# Patient Record
Sex: Female | Born: 1990 | Race: White | Hispanic: No | Marital: Single | Smoking: Never smoker
Health system: Southern US, Community
[De-identification: ages and names within clinical notes are randomized; demographics above are authoritative.]

## PROBLEM LIST (undated history)

## (undated) DIAGNOSIS — M5481 Occipital neuralgia: Secondary | ICD-10-CM

## (undated) DIAGNOSIS — F419 Anxiety disorder, unspecified: Secondary | ICD-10-CM

## (undated) DIAGNOSIS — R51 Headache: Secondary | ICD-10-CM

## (undated) DIAGNOSIS — R519 Headache, unspecified: Secondary | ICD-10-CM

## (undated) DIAGNOSIS — G43909 Migraine, unspecified, not intractable, without status migrainosus: Secondary | ICD-10-CM

## (undated) DIAGNOSIS — J45909 Unspecified asthma, uncomplicated: Secondary | ICD-10-CM

## (undated) DIAGNOSIS — I471 Supraventricular tachycardia, unspecified: Secondary | ICD-10-CM

## (undated) DIAGNOSIS — R000 Tachycardia, unspecified: Secondary | ICD-10-CM

## (undated) DIAGNOSIS — D649 Anemia, unspecified: Secondary | ICD-10-CM

## (undated) HISTORY — PX: OTHER SURGICAL HISTORY: SHX169

## (undated) HISTORY — PX: TONSILLECTOMY AND ADENOIDECTOMY: SHX28

---

## 2016-04-10 DIAGNOSIS — G4726 Circadian rhythm sleep disorder, shift work type: Secondary | ICD-10-CM | POA: Insufficient documentation

## 2017-05-24 DIAGNOSIS — G43709 Chronic migraine without aura, not intractable, without status migrainosus: Secondary | ICD-10-CM | POA: Insufficient documentation

## 2017-12-27 DIAGNOSIS — J4520 Mild intermittent asthma, uncomplicated: Secondary | ICD-10-CM | POA: Insufficient documentation

## 2018-04-09 DIAGNOSIS — R000 Tachycardia, unspecified: Secondary | ICD-10-CM | POA: Insufficient documentation

## 2018-09-17 DIAGNOSIS — F17200 Nicotine dependence, unspecified, uncomplicated: Secondary | ICD-10-CM | POA: Diagnosis not present

## 2018-09-17 DIAGNOSIS — J4520 Mild intermittent asthma, uncomplicated: Secondary | ICD-10-CM | POA: Diagnosis not present

## 2018-09-17 DIAGNOSIS — G43909 Migraine, unspecified, not intractable, without status migrainosus: Secondary | ICD-10-CM | POA: Diagnosis not present

## 2018-09-17 DIAGNOSIS — R002 Palpitations: Secondary | ICD-10-CM | POA: Diagnosis not present

## 2018-09-17 DIAGNOSIS — Z888 Allergy status to other drugs, medicaments and biological substances status: Secondary | ICD-10-CM | POA: Diagnosis not present

## 2018-09-17 DIAGNOSIS — Z91013 Allergy to seafood: Secondary | ICD-10-CM | POA: Diagnosis not present

## 2018-09-17 DIAGNOSIS — F419 Anxiety disorder, unspecified: Secondary | ICD-10-CM | POA: Diagnosis not present

## 2018-09-17 DIAGNOSIS — R000 Tachycardia, unspecified: Secondary | ICD-10-CM | POA: Diagnosis not present

## 2018-09-17 DIAGNOSIS — Z9102 Food additives allergy status: Secondary | ICD-10-CM | POA: Diagnosis not present

## 2018-09-17 DIAGNOSIS — Z91048 Other nonmedicinal substance allergy status: Secondary | ICD-10-CM | POA: Diagnosis not present

## 2018-10-04 DIAGNOSIS — J019 Acute sinusitis, unspecified: Secondary | ICD-10-CM | POA: Diagnosis not present

## 2018-11-20 ENCOUNTER — Other Ambulatory Visit: Payer: Self-pay

## 2018-11-25 ENCOUNTER — Emergency Department (HOSPITAL_BASED_OUTPATIENT_CLINIC_OR_DEPARTMENT_OTHER)
Admission: EM | Admit: 2018-11-25 | Discharge: 2018-11-25 | Disposition: A | Discharge status: Home / Self Care | Payer: BLUE CROSS/BLUE SHIELD | Attending: Emergency Medicine | Admitting: Emergency Medicine

## 2018-11-25 ENCOUNTER — Encounter (HOSPITAL_BASED_OUTPATIENT_CLINIC_OR_DEPARTMENT_OTHER): Payer: Self-pay | Admitting: *Deleted

## 2018-11-25 DIAGNOSIS — M542 Cervicalgia: Secondary | ICD-10-CM | POA: Diagnosis not present

## 2018-11-25 DIAGNOSIS — R739 Hyperglycemia, unspecified: Secondary | ICD-10-CM | POA: Insufficient documentation

## 2018-11-25 DIAGNOSIS — R51 Headache: Secondary | ICD-10-CM | POA: Diagnosis not present

## 2018-11-25 DIAGNOSIS — G2409 Other drug induced dystonia: Secondary | ICD-10-CM | POA: Diagnosis not present

## 2018-11-25 DIAGNOSIS — M791 Myalgia, unspecified site: Secondary | ICD-10-CM | POA: Diagnosis not present

## 2018-11-25 DIAGNOSIS — M5481 Occipital neuralgia: Secondary | ICD-10-CM | POA: Diagnosis not present

## 2018-11-25 DIAGNOSIS — G2402 Drug induced acute dystonia: Secondary | ICD-10-CM | POA: Insufficient documentation

## 2018-11-25 DIAGNOSIS — R000 Tachycardia, unspecified: Secondary | ICD-10-CM | POA: Diagnosis not present

## 2018-11-25 HISTORY — DX: Headache, unspecified: R51.9

## 2018-11-25 HISTORY — DX: Tachycardia, unspecified: R00.0

## 2018-11-25 HISTORY — DX: Headache: R51

## 2018-11-25 HISTORY — DX: Occipital neuralgia: M54.81

## 2018-11-25 LAB — URINALYSIS, ROUTINE W REFLEX MICROSCOPIC
BILIRUBIN URINE: NEGATIVE
Glucose, UA: >=500 mg/dL — AB
Ketones, ur: 15 mg/dL — AB
Leukocytes, UA: NEGATIVE
Nitrite: NEGATIVE
Protein, ur: NEGATIVE mg/dL
Specific Gravity, Urine: 1.02 (ref 1.005–1.030)
pH: 6 (ref 5.0–8.0)

## 2018-11-25 LAB — CBC WITH DIFFERENTIAL/PLATELET
Abs Immature Granulocytes: 0.01 10*3/uL (ref 0.00–0.07)
BASOS ABS: 0 10*3/uL (ref 0.0–0.1)
Basophils Relative: 0 %
Eosinophils Absolute: 0 10*3/uL (ref 0.0–0.5)
Eosinophils Relative: 0 %
HCT: 41.1 % (ref 36.0–46.0)
Hemoglobin: 13.7 g/dL (ref 12.0–15.0)
Immature Granulocytes: 0 %
LYMPHS PCT: 13 %
Lymphs Abs: 0.5 10*3/uL — ABNORMAL LOW (ref 0.7–4.0)
MCH: 29.3 pg (ref 26.0–34.0)
MCHC: 33.3 g/dL (ref 30.0–36.0)
MCV: 87.8 fL (ref 80.0–100.0)
Monocytes Absolute: 0.1 10*3/uL (ref 0.1–1.0)
Monocytes Relative: 2 %
Neutro Abs: 3.3 10*3/uL (ref 1.7–7.7)
Neutrophils Relative %: 85 %
Platelets: 333 10*3/uL (ref 150–400)
RBC: 4.68 MIL/uL (ref 3.87–5.11)
RDW: 11.9 % (ref 11.5–15.5)
WBC: 3.9 10*3/uL — ABNORMAL LOW (ref 4.0–10.5)
nRBC: 0 % (ref 0.0–0.2)

## 2018-11-25 LAB — PREGNANCY, URINE: Preg Test, Ur: NEGATIVE

## 2018-11-25 LAB — BASIC METABOLIC PANEL
Anion gap: 9 (ref 5–15)
BUN: 11 mg/dL (ref 6–20)
CO2: 17 mmol/L — ABNORMAL LOW (ref 22–32)
Calcium: 9.3 mg/dL (ref 8.9–10.3)
Chloride: 106 mmol/L (ref 98–111)
Creatinine, Ser: 0.84 mg/dL (ref 0.44–1.00)
GFR calc Af Amer: >60 mL/min (ref >60)
GFR calc non Af Amer: >60 mL/min (ref >60)
Glucose, Bld: 187 mg/dL — ABNORMAL HIGH (ref 70–99)
Potassium: 4 mmol/L (ref 3.5–5.1)
Sodium: 132 mmol/L — ABNORMAL LOW (ref 135–145)

## 2018-11-25 LAB — URINALYSIS, MICROSCOPIC (REFLEX)

## 2018-11-25 LAB — CBG MONITORING, ED: Glucose-Capillary: 177 mg/dL — ABNORMAL HIGH (ref 70–99)

## 2018-11-25 MED ORDER — DIPHENHYDRAMINE HCL 50 MG/ML IJ SOLN
25.0000 mg | Freq: Once | INTRAMUSCULAR | Status: AC
  Administered 2018-11-25: 25 mg via INTRAVENOUS
  Filled 2018-11-25: qty 1

## 2018-11-25 NOTE — ED Provider Notes (Signed)
[facility] EMERGENCY DEPARTMENT Provider Note   CSN: [id number] Arrival date & time: 11/25/18  1912     History   Chief Complaint Chief Complaint  Patient presents with  . Headache    HPI Michelle Vincent is a 28 y.o. female.  She is complaining of acute onset of right-sided headache and spasm in the right side of her neck and head and jaw.  She is causing her head to turn.  Her coworkers noticed this is unusual and took her home.  This all started around 3:30 PM.  She said she is feels a little bit of tingling in her right arm.  No blurry vision or double vision.  She has a history of headaches and had some trigger point injections for occipital neuralgia today which she has had before and that would not cause anything like this.  No recent illness.  The history is provided by the patient.  Headache  Pain location:  R temporal Quality:  Dull Radiates to:  R neck Onset quality:  Sudden Duration:  4 hours Timing:  Constant Progression:  Unchanged Chronicity:  Recurrent Similar to prior headaches: yes   Associated symptoms: facial pain, neck pain and tingling   Associated symptoms: no abdominal pain, no back pain, no fever, no numbness, no sore throat, no visual change, no vomiting and no weakness     Past Medical History:  Diagnosis Date  . HA (headache)   . Occipital neuralgia   . Sinus tachycardia     There are no active problems to display for this patient.   Past Surgical History:  Procedure Laterality Date  . shoulder Right      OB History   No obstetric history on file.      Home Medications    Prior to Admission medications   Not on File    Family History History reviewed. No pertinent family history.  Social History Social History   Tobacco Use  . Smoking status: Never Smoker  . Smokeless tobacco: Never Used  Substance Use Topics  . Alcohol use: Never    Frequency: Never  . Drug use: Never     Allergies   Shellfish  allergy and Reglan [metoclopramide]   Review of Systems Review of Systems  Constitutional: Negative for fever.  HENT: Negative for sore throat.   Eyes: Negative for visual disturbance.  Respiratory: Negative for shortness of breath.   Cardiovascular: Negative for chest pain.  Gastrointestinal: Negative for abdominal pain and vomiting.  Genitourinary: Negative for dysuria.  Musculoskeletal: Positive for neck pain. Negative for back pain.  Skin: Negative for rash.  Neurological: Positive for headaches. Negative for weakness and numbness.     Physical Exam Updated Vital Signs BP 126/75 (BP Location: Left Arm)   Pulse (!) 110   Temp (!) 97.5 F (36.4 C) (Oral)   Resp 16   Ht 5\' 7"  (1.702 m)   Wt 64 kg   SpO2 100%   BMI 22.08 kg/m   Physical Exam Vitals signs and nursing note reviewed.  Constitutional:      General: She is not in acute distress.    Appearance: She is well-developed.  HENT:     Head: Normocephalic and atraumatic.  Eyes:     Conjunctiva/sclera: Conjunctivae normal.  Neck:     Musculoskeletal: Neck supple.  Cardiovascular:     Rate and Rhythm: Normal rate and regular rhythm.     Heart sounds: No murmur.  Pulmonary:  Effort: Pulmonary effort is normal. No respiratory distress.     Breath sounds: Normal breath sounds.  Abdominal:     Palpations: Abdomen is soft.     Tenderness: There is no abdominal tenderness.  Skin:    General: Skin is warm and dry.  Neurological:     Mental Status: She is alert.     GCS: GCS eye subscore is 4. GCS verbal subscore is 5. GCS motor subscore is 6.     Cranial Nerves: No cranial nerve deficit or dysarthria.     Sensory: Sensory deficit (subjective decrease right arm) present.     Motor: No weakness.      ED Treatments / Results  Labs (all labs ordered are listed, but only abnormal results are displayed) Labs Reviewed  BASIC METABOLIC PANEL - Abnormal; Notable for the following components:      Result Value     Sodium 132 (*)    CO2 17 (*)    Glucose, Bld 187 (*)    All other components within normal limits  CBC WITH DIFFERENTIAL/PLATELET - Abnormal; Notable for the following components:   WBC 3.9 (*)    Lymphs Abs 0.5 (*)    All other components within normal limits  URINALYSIS, ROUTINE W REFLEX MICROSCOPIC - Abnormal; Notable for the following components:   Glucose, UA >=500 (*)    Hgb urine dipstick TRACE (*)    Ketones, ur 15 (*)    All other components within normal limits  URINALYSIS, MICROSCOPIC (REFLEX) - Abnormal; Notable for the following components:   Bacteria, UA RARE (*)    All other components within normal limits  CBG MONITORING, ED - Abnormal; Notable for the following components:   Glucose-Capillary 177 (*)    All other components within normal limits  PREGNANCY, URINE    EKG EKG Interpretation  Date/Time:  Monday November 25 2018 19:23:23 EST Ventricular Rate:  113 PR Interval:  128 QRS Duration: 66 QT Interval:  324 QTC Calculation: 444 R Axis:   97 Text Interpretation:  Sinus tachycardia Rightward axis Nonspecific T wave abnormality Abnormal ECG no prior to compare with Confirmed by Meridee Score [phone]) on 11/25/2018 7:39:11 PM   Radiology No results found.  Procedures Procedures (including critical care time)  Medications Ordered in ED Medications  diphenhydrAMINE (BENADRYL) injection 25 mg (has no administration in time range)     Initial Impression / Assessment and Plan / ED Course  I have reviewed the triage vital signs and the nursing notes.  Pertinent labs & imaging results that were available during my care of the patient were reviewed by me and considered in my medical decision making (see chart for details).  Clinical Course as of Nov 25 2344  Mon Nov 25, 2018  3888 Reevaluated patient after 20 mg of IV Benadryl.  Her neck spasm is much improved her face symmetry looks better and she is able to speak more easily.  She says her symptoms  are 90% improved already.   [MB]  2117 Patient feels much better now and the rest of her work-up is been fairly unremarkable other than a slight bump in her sugars.  She says her brother is a type I diabetic so she may be declaring herself without or may just be fact that she had this steroid challenge with the injections.  She understands to follow-up with her neurologist regarding this.   [MB]    Clinical Course User Index [MB] Terrilee Files, MD  Final Clinical Impressions(s) / ED Diagnoses   Final diagnoses:  Dystonic drug reaction  Hyperglycemia    ED Discharge Orders    None       Terrilee FilesButler, Jaleal Schliep C, MD 11/25/18 [phone]

## 2018-11-25 NOTE — ED Triage Notes (Signed)
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  Pt had injections in her head this morning for migraine and occipital neuralgia. Noted to have a slight facial droop, weakness noted on the left side.  pts boyfriends denies confusion. Pt reports right sided HA.

## 2018-11-25 NOTE — ED Notes (Signed)
Post benadryl- pt does not appear to be as stiff in neck/head. Pt reports feeling better. Still decreased sensation to right side. Pt ambulatory to BR with steady gate.

## 2018-11-25 NOTE — ED Notes (Signed)
ED Provider at bedside. 

## 2018-11-25 NOTE — Discharge Instructions (Addendum)
You were seen in the emergency department for intense face and neck spasms.  Your symptoms improved with some Benadryl.  This is likely a dystonic reaction and is usually caused by medications.  You should continue the Benadryl 1 to 2 tablets every 6 hours as needed.  Please contact your neurologist and let them know about this issue that you had.  Also your blood sugar was mildly elevated and this may be a sign of diabetes or may just be related to the steroid injections.  This will need to be followed by your regular doctor for further blood testing.

## 2018-11-29 ENCOUNTER — Encounter: Payer: Self-pay | Admitting: Family Medicine

## 2018-11-29 ENCOUNTER — Ambulatory Visit (INDEPENDENT_AMBULATORY_CARE_PROVIDER_SITE_OTHER): Payer: BLUE CROSS/BLUE SHIELD | Admitting: Family Medicine

## 2018-11-29 VITALS — Ht 67.0 in | Wt 144.0 lb

## 2018-11-29 DIAGNOSIS — Z3202 Encounter for pregnancy test, result negative: Secondary | ICD-10-CM | POA: Diagnosis not present

## 2018-11-29 DIAGNOSIS — Z30017 Encounter for initial prescription of implantable subdermal contraceptive: Secondary | ICD-10-CM | POA: Diagnosis not present

## 2018-11-29 DIAGNOSIS — Z124 Encounter for screening for malignant neoplasm of cervix: Secondary | ICD-10-CM | POA: Diagnosis not present

## 2018-11-29 DIAGNOSIS — Z01419 Encounter for gynecological examination (general) (routine) without abnormal findings: Secondary | ICD-10-CM

## 2018-11-29 DIAGNOSIS — Z113 Encounter for screening for infections with a predominantly sexual mode of transmission: Secondary | ICD-10-CM

## 2018-11-29 LAB — POCT URINE PREGNANCY: Preg Test, Ur: NEGATIVE

## 2018-11-29 MED ORDER — MEDROXYPROGESTERONE ACETATE 10 MG PO TABS: 10.0000 mg | ORAL_TABLET | Freq: Every day | ORAL | 6 refills | Status: DC | PRN

## 2018-11-29 MED ORDER — ETONOGESTREL 68 MG ~~LOC~~ IMPL
68.0000 mg | DRUG_IMPLANT | Freq: Once | SUBCUTANEOUS | Status: AC
  Administered 2018-11-29: 68 mg via SUBCUTANEOUS

## 2018-11-29 NOTE — Patient Instructions (Addendum)
Nexplanon Instructions After Insertion   Keep bandage clean and dry for 24 hours   May use ice/Tylenol/Ibuprofen for soreness or pain   If you develop fever, drainage or increased warmth from incision site-contact office immediately    Preventive Care 18-39 Years, Female Preventive care refers to lifestyle choices and visits with your health care provider that can promote health and wellness. What does preventive care include?   A yearly physical exam. This is also called an annual well check.  Dental exams once or twice a year.  Routine eye exams. Ask your health care provider how often you should have your eyes checked.  Personal lifestyle choices, including: ? Daily care of your teeth and gums. ? Regular physical activity. ? Eating a healthy diet. ? Avoiding tobacco and drug use. ? Limiting alcohol use. ? Practicing safe sex. ? Taking vitamin and mineral supplements as recommended by your health care provider. What happens during an annual well check? The services and screenings done by your health care provider during your annual well check will depend on your age, overall health, lifestyle risk factors, and family history of disease. Counseling Your health care provider may ask you questions about your:  Alcohol use.  Tobacco use.  Drug use.  Emotional well-being.  Home and relationship well-being.  Sexual activity.  Eating habits.  Work and work Statistician.  Method of birth control.  Menstrual cycle.  Pregnancy history. Screening You may have the following tests or measurements:  Height, weight, and BMI.  Diabetes screening. This is done by checking your blood sugar (glucose) after you have not eaten for a while (fasting).  Blood pressure.  Lipid and cholesterol levels. These may be checked every 5 years starting at age 4.  Skin check.  Hepatitis C blood test.  Hepatitis B blood test.  Sexually transmitted disease (STD)  testing.  BRCA-related cancer screening. This may be done if you have a family history of breast, ovarian, tubal, or peritoneal cancers.  Pelvic exam and Pap test. This may be done every 3 years starting at age 65. Starting at age 76, this may be done every 5 years if you have a Pap test in combination with an HPV test. Discuss your test results, treatment options, and if necessary, the need for more tests with your health care provider. Vaccines Your health care provider may recommend certain vaccines, such as:  Influenza vaccine. This is recommended every year.  Tetanus, diphtheria, and acellular pertussis (Tdap, Td) vaccine. You may need a Td booster every 10 years.  Varicella vaccine. You may need this if you have not been vaccinated.  HPV vaccine. If you are 48 or younger, you may need three doses over 6 months.  Measles, mumps, and rubella (MMR) vaccine. You may need at least one dose of MMR. You may also need a second dose.  Pneumococcal 13-valent conjugate (PCV13) vaccine. You may need this if you have certain conditions and were not previously vaccinated.  Pneumococcal polysaccharide (PPSV23) vaccine. You may need one or two doses if you smoke cigarettes or if you have certain conditions.  Meningococcal vaccine. One dose is recommended if you are age 13-21 years and a first-year college student living in a residence hall, or if you have one of several medical conditions. You may also need additional booster doses.  Hepatitis A vaccine. You may need this if you have certain conditions or if you travel or work in places where you may be exposed to hepatitis A.  Hepatitis B vaccine. You may need this if you have certain conditions or if you travel or work in places where you may be exposed to hepatitis B.  Haemophilus influenzae type b (Hib) vaccine. You may need this if you have certain risk factors. Talk to your health care provider about which screenings and vaccines you need  and how often you need them. This information is not intended to replace advice given to you by your health care provider. Make sure you discuss any questions you have with your health care provider. Document Released: 12/19/2001 Document Revised: 06/05/2017 Document Reviewed: 08/24/2015 Elsevier Interactive Patient Education  2019 [employer].

## 2018-11-29 NOTE — Progress Notes (Signed)
GYNECOLOGY ANNUAL PREVENTATIVE CARE ENCOUNTER NOTE  Subjective:   Michelle Vincent is a 28 y.o. G0P0000 female here for a routine annual gynecologic exam.  Current complaints: heavy painful periods on the nuvaring. Has had an ovarian cyst, but only once.   Denies abnormal vaginal bleeding, discharge, pelvic pain, problems with intercourse or other gynecologic concerns.    Gynecologic History Patient's last menstrual period was 11/08/2018 (within days). Patient is sexually active  Contraception: NuvaRing vaginal inserts Last Pap: "awhile ago". Results were: normal Last mammogram: n/a.  Obstetric History OB History  Gravida Para Term Preterm AB Living  0 0 0 0 0 0  SAB TAB Ectopic Multiple Live Births  0 0 0 0 0    Past Medical History:  Diagnosis Date  . HA (headache)   . Occipital neuralgia   . Sinus tachycardia     Past Surgical History:  Procedure Laterality Date  . shoulder Right     No current outpatient medications on file prior to visit.   No current facility-administered medications on file prior to visit.     Allergies  Allergen Reactions  . Shellfish Allergy Anaphylaxis  . Reglan [Metoclopramide]     Muscle spasms    Social History   Socioeconomic History  . Marital status: Married    Spouse name: Not on file  . Number of children: Not on file  . Years of education: Not on file  . Highest education level: Not on file  Occupational History  . Not on file  Social Needs  . Financial resource strain: Not on file  . Food insecurity:    Worry: Not on file    Inability: Not on file  . Transportation needs:    Medical: Not on file    Non-medical: Not on file  Tobacco Use  . Smoking status: Never Smoker  . Smokeless tobacco: Never Used  Substance and Sexual Activity  . Alcohol use: Yes    Alcohol/week: 1.0 standard drinks    Types: 1 Glasses of wine per week    Frequency: Never  . Drug use: Never  . Sexual activity: Yes  Lifestyle  .  Physical activity:    Days per week: Not on file    Minutes per session: Not on file  . Stress: Not on file  Relationships  . Social connections:    Talks on phone: Not on file    Gets together: Not on file    Attends religious service: Not on file    Active member of club or organization: Not on file    Attends meetings of clubs or organizations: Not on file    Relationship status: Not on file  . Intimate partner violence:    Fear of current or ex partner: Not on file    Emotionally abused: Not on file    Physically abused: Not on file    Forced sexual activity: Not on file  Other Topics Concern  . Not on file  Social History Narrative  . Not on file    Family History  Problem Relation Age of Onset  . Hypertension Mother   . Stroke Mother   . Diabetes Brother   . Breast cancer Maternal Aunt   . Cancer Maternal Aunt     The following portions of the patient's history were reviewed and updated as appropriate: allergies, current medications, past family history, past medical history, past social history, past surgical history and problem list.  Review of Systems Pertinent  items are noted in HPI.   Objective:  Ht 5\' 7"  (1.702 m)   Wt 144 lb (65.3 kg)   LMP 11/08/2018 (Within Days)   BMI 22.55 kg/m  Wt Readings from Last 3 Encounters:  11/29/18 144 lb (65.3 kg)  11/25/18 141 lb (64 kg)     CONSTITUTIONAL: Well-developed, well-nourished female in no acute distress.  HENT:  Normocephalic, atraumatic, External right and left ear normal. Oropharynx is clear and moist EYES: Conjunctivae and EOM are normal. Pupils are equal, round, and reactive to light. No scleral icterus.  NECK: Normal range of motion, supple, no masses.  Normal thyroid.   CARDIOVASCULAR: Normal heart rate noted, regular rhythm RESPIRATORY: Clear to auscultation bilaterally. Effort and breath sounds normal, no problems with respiration noted. BREASTS: Symmetric in size. No masses, skin changes, nipple  drainage, or lymphadenopathy. ABDOMEN: Soft, normal bowel sounds, no distention noted.  No tenderness, rebound or guarding.  PELVIC: Normal appearing external genitalia; normal appearing vaginal mucosa and cervix.  No abnormal discharge noted.  Normal uterine size, no other palpable masses, no uterine or adnexal tenderness. MUSCULOSKELETAL: Normal range of motion. No tenderness.  No cyanosis, clubbing, or edema.  2+ distal pulses. SKIN: Skin is warm and dry. No rash noted. Not diaphoretic. No erythema. No pallor. NEUROLOGIC: Alert and oriented to person, place, and time. Normal reflexes, muscle tone coordination. No cranial nerve deficit noted. PSYCHIATRIC: Normal mood and affect. Normal behavior. Normal judgment and thought content.  Assessment:  Annual gynecologic examination with pap smear   Plan:  1. Well Woman Exam Will follow up results of pap smear and manage accordingly. STD testing discussed. Patient requested vaginal testing  2. Encounter for nexplanon insertion See separate procedure note.  Routine preventative health maintenance measures emphasized. Please refer to After Visit Summary for other counseling recommendations.    Candelaria Celeste, DO [facility]

## 2018-11-29 NOTE — Progress Notes (Signed)
Patient currently using Nuvaring. Patient having heavy discharge - patient states that she has lots of clots and tissue when her period starts.  Armandina Stammer RN

## 2018-11-29 NOTE — Progress Notes (Signed)
Nexplanon Insertion:  Patient given informed consent, signed copy in the chart, time out was performed. Appropriate time out taken.  Patient's left arm was prepped and draped in the usual sterile fashion.. The ruler used to measure and mark insertion area.  Pt was prepped with alcohol swab and then injected with 5 cc of 2% lidocaine with epinephrine.  Pt was prepped with betadine, Implanon removed form packaging,  Device confirmed in needle, then inserted full length of needle and withdrawn per handbook instructions.  Device palpated by physician and patient.  Pt insertion site covered with pressure dressing.   Minimal blood loss.  Pt tolerated the procedure well.  

## 2018-12-02 LAB — CYTOLOGY - PAP
Chlamydia: NEGATIVE
Diagnosis: NEGATIVE
NEISSERIA GONORRHEA: NEGATIVE
Trichomonas: NEGATIVE

## 2018-12-09 ENCOUNTER — Encounter: Payer: Self-pay | Admitting: Family

## 2018-12-10 ENCOUNTER — Telehealth: Payer: Self-pay

## 2018-12-10 NOTE — Telephone Encounter (Signed)
Pt called the office requesting Pap smear results. Informed pt that Pap smear is normal and STD screening is negative. Understanding was voiced. Lujain Kraszewski l Delance Weide, CMA

## 2019-01-22 ENCOUNTER — Encounter (HOSPITAL_COMMUNITY): Payer: Self-pay | Admitting: Emergency Medicine

## 2019-01-22 ENCOUNTER — Emergency Department (HOSPITAL_COMMUNITY)
Admission: EM | Admit: 2019-01-22 | Discharge: 2019-01-23 | Disposition: A | Discharge status: Home / Self Care | Payer: 59 | Attending: Emergency Medicine | Admitting: Emergency Medicine

## 2019-01-22 ENCOUNTER — Other Ambulatory Visit: Payer: Self-pay

## 2019-01-22 DIAGNOSIS — R110 Nausea: Secondary | ICD-10-CM | POA: Insufficient documentation

## 2019-01-22 DIAGNOSIS — R0602 Shortness of breath: Secondary | ICD-10-CM | POA: Diagnosis not present

## 2019-01-22 DIAGNOSIS — R000 Tachycardia, unspecified: Secondary | ICD-10-CM | POA: Diagnosis not present

## 2019-01-22 DIAGNOSIS — R072 Precordial pain: Secondary | ICD-10-CM

## 2019-01-22 DIAGNOSIS — R0789 Other chest pain: Secondary | ICD-10-CM | POA: Diagnosis present

## 2019-01-22 HISTORY — DX: Supraventricular tachycardia: I47.1

## 2019-01-22 HISTORY — DX: Supraventricular tachycardia, unspecified: I47.10

## 2019-01-22 MED ORDER — SODIUM CHLORIDE 0.9 % IV BOLUS
1000.0000 mL | Freq: Once | INTRAVENOUS | Status: AC
  Administered 2019-01-22: 1000 mL via INTRAVENOUS

## 2019-01-22 MED ORDER — ACETAMINOPHEN 325 MG PO TABS
650.0000 mg | ORAL_TABLET | Freq: Once | ORAL | Status: AC
  Administered 2019-01-22: 650 mg via ORAL
  Filled 2019-01-22: qty 2

## 2019-01-22 MED ORDER — ONDANSETRON HCL 4 MG/2ML IJ SOLN
4.0000 mg | Freq: Once | INTRAMUSCULAR | Status: AC
  Administered 2019-01-22: 4 mg via INTRAVENOUS
  Filled 2019-01-22: qty 2

## 2019-01-22 MED ORDER — KETOROLAC TROMETHAMINE 15 MG/ML IJ SOLN
15.0000 mg | Freq: Once | INTRAMUSCULAR | Status: AC
  Administered 2019-01-22: 15 mg via INTRAVENOUS
  Filled 2019-01-22: qty 1

## 2019-01-22 MED ORDER — METOPROLOL TARTRATE 5 MG/5ML IV SOLN
2.5000 mg | Freq: Once | INTRAVENOUS | Status: AC
  Administered 2019-01-22: 2.5 mg via INTRAVENOUS
  Filled 2019-01-22: qty 5

## 2019-01-22 NOTE — ED Triage Notes (Signed)
Pt c/o chest pain, shortness of breath and nausea. Symptoms started approx. 1 hour ago. Hx SVT.

## 2019-01-22 NOTE — ED Provider Notes (Signed)
[facility] EMERGENCY DEPARTMENT Provider Note   CSN: [id number] Arrival date & time: 01/22/19  2153    History   Chief Complaint Chief Complaint  Patient presents with  . Chest Pain    HPI Michelle Vincent is a 28 y.o. female.     Patient with history of tachycardia on beta-blocker presents the emergency department with complaint of chest pain, shortness of breath, and nausea.  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  She was not doing anything strenuous except for helping to distribute equipment.  Her symptoms began acutely.  They are consistent with previous episodes.  Patient complains of some mild substernal chest pain as well as shortness of breath.  No lightheadedness or syncope.  She has some mild nausea without vomiting.  States that when her symptoms occur, typically she will be symptomatic for several hours before gradually improving.  No treatments prior to arrival.  Last dose of metoprolol was 3 AM today.  She has seen a cardiologist at [facility] and has had a previous attempted ablation but it sounds like they were unable to find the abnormal pathway.  Patient denies risk factors for pulmonary embolism including: unilateral leg swelling, history of DVT/PE/other blood clots, use of exogenous hormones, recent immobilizations, recent surgery, recent travel (>4hr segment), malignancy, hemoptysis.  No recent nausea, vomiting, or diarrhea or other reasons for dehydration.  No recent viral illness.       Past Medical History:  Diagnosis Date  . HA (headache)   . Occipital neuralgia   . Sinus tachycardia   . SVT (supraventricular tachycardia) (HCC)     There are no active problems to display for this patient.   Past Surgical History:  Procedure Laterality Date  . shoulder Right      OB History    Gravida  0   Para  0   Term  0   Preterm  0   AB  0   Living  0     SAB  0   TAB  0   Ectopic  0   Multiple  0   Live Births  0            Home Medications    Prior to Admission medications   Medication Sig Start Date End Date Taking? Authorizing Provider  medroxyPROGESTERone (PROVERA) 10 MG tablet Take 1 tablet (10 mg total) by mouth daily as needed. For breakthrough bleeding 11/29/18   Levie Heritage, DO    Family History Family History  Problem Relation Age of Onset  . Hypertension Mother   . Stroke Mother   . Diabetes Brother   . Breast cancer Maternal Aunt   . Cancer Maternal Aunt     Social History Social History   Tobacco Use  . Smoking status: Never Smoker  . Smokeless tobacco: Never Used  Substance Use Topics  . Alcohol use: Yes    Alcohol/week: 1.0 standard drinks    Types: 1 Glasses of wine per week    Frequency: Never  . Drug use: Never     Allergies   Shellfish allergy and Reglan [metoclopramide]   Review of Systems Review of Systems  Constitutional: Negative for diaphoresis and fever.  Eyes: Negative for redness.  Respiratory: Positive for shortness of breath. Negative for cough.   Cardiovascular: Positive for chest pain and palpitations. Negative for leg swelling.  Gastrointestinal: Positive for nausea. Negative for abdominal pain and  vomiting.  Genitourinary: Negative for dysuria.  Musculoskeletal: Negative for back pain and neck pain.  Skin: Negative for rash.  Neurological: Negative for syncope and light-headedness.  Psychiatric/Behavioral: The patient is not nervous/anxious.      Physical Exam Updated Vital Signs BP 116/88   Pulse 99   Resp 17   Ht 5\' 7"  (1.702 m)   Wt 63.5 kg   LMP 01/08/2019   SpO2 100%   BMI 21.93 kg/m   Physical Exam Vitals signs and nursing note reviewed.  Constitutional:      Appearance: She is well-developed. She is not diaphoretic.  HENT:     Head: Normocephalic and atraumatic.     Mouth/Throat:     Mouth: Mucous membranes are not dry.  Eyes:     Conjunctiva/sclera: Conjunctivae normal.   Neck:     Musculoskeletal: Normal range of motion and neck supple. No muscular tenderness.     Vascular: Normal carotid pulses. No carotid bruit or JVD.     Trachea: Trachea normal. No tracheal deviation.  Cardiovascular:     Rate and Rhythm: Regular rhythm. Tachycardia present.     Pulses: No decreased pulses.     Heart sounds: Normal heart sounds, S1 normal and S2 normal. No murmur.     Comments: Mild tachycardia 100-110 with sinus arrhythmia. Pulmonary:     Effort: Pulmonary effort is normal. No respiratory distress.     Breath sounds: No wheezing.  Chest:     Chest wall: No tenderness.  Abdominal:     General: Bowel sounds are normal.     Palpations: Abdomen is soft.     Tenderness: There is no abdominal tenderness. There is no guarding or rebound.  Musculoskeletal: Normal range of motion.  Skin:    General: Skin is warm and dry.     Coloration: Skin is not pale.  Neurological:     Mental Status: She is alert.      ED Treatments / Results  Labs (all labs ordered are listed, but only abnormal results are displayed) Labs Reviewed - No data to display  EKG EKG Interpretation  Date/Time:  Wednesday January 22 2019 21:56:33 EDT Ventricular Rate:  115 PR Interval:    QRS Duration: 83 QT Interval:  355 QTC Calculation: 491 R Axis:   87 Text Interpretation:  Sinus tachycardia Probable left atrial enlargement Nonspecific repol abnormality, diffuse leads Borderline prolonged QT interval Confirmed by Kristine Royal [phone]) on 01/22/2019 10:00:19 PM   Radiology No results found.  Procedures Procedures (including critical care time)  Medications Ordered in ED Medications  sodium chloride 0.9 % bolus 1,000 mL (0 mLs Intravenous Stopped 01/22/19 2309)  metoprolol tartrate (LOPRESSOR) injection 2.5 mg (2.5 mg Intravenous Given 01/22/19 2221)  ondansetron (ZOFRAN) injection 4 mg (4 mg Intravenous Given 01/22/19 2221)  ketorolac (TORADOL) 15 MG/ML injection 15 mg (15 mg  Intravenous Given 01/22/19 2221)  acetaminophen (TYLENOL) tablet 650 mg (650 mg Oral Given 01/22/19 2338)     Initial Impression / Assessment and Plan / ED Course  I have reviewed the triage vital signs and the nursing notes.  Pertinent labs & imaging results that were available during my care of the patient were reviewed by me and considered in my medical decision making (see chart for details).        Patient seen and examined. EKG reviewed.  Will give fluids and treat symptoms.  Discussed with patient that lab work-up likely not to be helpful at this point.  She  agrees with foregoing labs at this point.  Will reassess.  Vital signs reviewed and are as follows: BP 116/88   Pulse 99   Resp 17   Ht 5\' 7"  (1.702 m)   Wt 63.5 kg   LMP 01/08/2019   SpO2 100%   BMI 21.93 kg/m   11:14 PM HR improved into 80's. Nausea improved. Toradol helped CP for a little while but she continues to have L CP. No distress. Tylenol ordered. Will reassess.   12:03 AM patient continues to feel better.  She is ready for discharge home at this time.  Cardiology referral given.  Encouraged return with worsening symptoms, shortness of breath, syncope, worsening or different chest pain.  She verbalizes understanding agrees with plan.  Encouraged rest tonight.  Final Clinical Impressions(s) / ED Diagnoses   Final diagnoses:  Sinus tachycardia  Precordial pain   Patient with bouts of tachycardia, questionable SVT in the past.  Typically she will get chest pain and shortness of breath with nausea with these episodes.  Typical episode for her tonight.  Improved while in emergency department.  She was treated symptomatically here.  EKG shows sinus tachycardia without any other arrhythmogenic findings.  She appears well.  She would like a referral to a cardiologist here in [place].  She is a paramedic and is reliable to return with worsening or changing symptoms.  ED Discharge Orders    None       Renne Crigler, Cordelia Poche 01/23/19 0005    Wynetta Fines, MD 01/27/19 [phone]

## 2019-01-23 NOTE — Discharge Instructions (Signed)
Please read and follow all provided instructions.  Your diagnoses today include:  1. Sinus tachycardia   2. Precordial pain     Tests performed today include:  An EKG of your heart  Vital signs. See below for your results today.   Medications prescribed:   None  Take any prescribed medications only as directed.  Follow-up instructions: Please follow-up with your primary care provider and cardiology referral for further evaluation.   Return instructions:   You wake from sleep with chest pain or shortness of breath.  You feel dizzy or faint.  You have chest pain not typical of your usual pain for which you originally saw your caregiver.   You have any other emergent concerns regarding your health.  Additional Information: Chest pain comes from many different causes. Your caregiver has diagnosed you as having chest pain that is not specific for one problem, but does not require admission.  You are at low risk for an acute heart condition or other serious illness.   Your vital signs today were: BP 101/68    Pulse 74    Resp 18    Ht 5\' 7"  (1.702 m)    Wt 63.5 kg    LMP 01/08/2019    SpO2 100%    BMI 21.93 kg/m  If your blood pressure (BP) was elevated above 135/85 this visit, please have this repeated by your doctor within one month. --------------

## 2019-01-23 NOTE — ED Notes (Signed)
Patient verbalizes understanding of discharge instructions. Opportunity for questioning and answers were provided. Armband removed by staff, pt discharged from ED, ambulatory to lobby with significant other.  

## 2019-01-28 ENCOUNTER — Telehealth: Payer: Self-pay | Admitting: Cardiology

## 2019-01-28 NOTE — Telephone Encounter (Signed)
Called patient--unable to leave message - mailbox is full  Reviewing chart this looks as if this would be a new patient to the practice.   reviewing information - looks as if patient has seen a cardiologist at [facility] - Nov 2019

## 2019-01-28 NOTE — Telephone Encounter (Signed)
New Message:     Pt was seen in [facility] ER and was given instructions to follow up with one of our Cardiologist in 1 week. I scheduled pt on 02-18-19, because of our policy to schedule patients after 02-17-19. Please evaluate and see if pt needs to be seen this week as instructed.

## 2019-01-29 NOTE — Telephone Encounter (Signed)
Voice mailbox not set up yet unable to leave message .Zack Seal

## 2019-02-25 ENCOUNTER — Telehealth: Payer: Self-pay

## 2019-02-25 NOTE — Telephone Encounter (Signed)
Called to convert appointment into a virtual visit if agreed.  Patient has not set up her vmail so I was unable to leave a vmail.

## 2019-03-01 NOTE — Progress Notes (Signed)
Virtual Visit via Video Note   This visit type was conducted due to national recommendations for restrictions regarding the COVID-19 Pandemic (e.g. social distancing) in an effort to limit this patient's exposure and mitigate transmission in our community.  Due to her co-morbid illnesses, this patient is at least at moderate risk for complications without adequate follow up.  This format is felt to be most appropriate for this patient at this time.  All issues noted in this document were discussed and addressed.  A limited physical exam was performed with this format.  Please refer to the patient's chart for her consent to telehealth for [facility].   Evaluation Performed:    New patient  Date:  03/03/2019   ID:  Michelle Heckabatha Stallworth, DOB 09/19/1991, MRN [id number]  Patient Location: Home Provider Location: Home  PCP:  Ardell IsaacsHill, Stacy, NP  Cardiologist:  Rollene RotundaJames Jacqualyn Sedgwick, MD  Electrophysiologist:  None   Chief Complaint:  Palpitations   History of Present Illness:    Michelle Vincent is a 28 y.o. female who was referred by Ardell IsaacsHill, Stacy, NP for evaluation of tachycardia.  She was in the ED for this in March.  She had chest pain and dyspnea.  I reviewed these records for this visit.  She is a paramedic.  [facility]  [facility] and I was able to review an EP study.  There were no inducible arrhythmias or pathways identified.  She was treated with AV nodal blocking agents.   In 2015 she had a normal echo.   She has had this history and says that she has been told she had sinus tachycardia.  I do see some mention of SVT but I do not see that this was a formal diagnosis.  She is worn a monitor but it sounds like it is been about a year and a half ago.  She says that her heart rate goes up every day.  Sometimes it is a gradual rise though sometimes it might be abrupt.  Her heart rate seems to go back and forth.  She will feel dizzy and lightheaded with  it.  She will have some chest discomfort.  Seems to go away by itself.  She is tried vagal maneuvers which may or may not help.  I do see that last year her TSH was normal.  More recently when she was in the emergency room her electrolytes were normal.  Her EKG demonstrated sinus tachycardia with a rate of 115 and normal sinus rhythm with no other significant findings.  She has not had orthostatic blood pressure readings although she does describe some mild orthostasis.  She has not had any frank syncope recently.  She cannot bring on chest discomfort.  She does do some activities at work but she does not exercise routinely.  She has had no new shortness of breath, PND or orthopnea.  She had no weight gain or edema.   The patient does have symptoms concerning for COVID-19 infection (fever, chills, cough, or new shortness of breath).    Past Medical History:  Diagnosis Date  . HA (headache)   . Occipital neuralgia   . Sinus tachycardia   . SVT (supraventricular tachycardia) (HCC)    Past Surgical History:  Procedure Laterality Date  . shoulder Right   . TONSILLECTOMY AND ADENOIDECTOMY       Current Meds  Medication Sig  . amitriptyline (ELAVIL) 25 MG tablet 1-2 tablets at bedtime  Allergies:   Shellfish allergy and Reglan [metoclopramide]   Social History   Tobacco Use  . Smoking status: Never Smoker  . Smokeless tobacco: Never Used  Substance Use Topics  . Alcohol use: Yes    Alcohol/week: 1.0 standard drinks    Types: 1 Glasses of wine per week    Frequency: Never  . Drug use: Never     Family Hx: The patient's family history includes Breast cancer in her maternal aunt; Cancer in her maternal aunt; Diabetes in her brother; Hypertension in her mother; Transient ischemic attack in her mother.  ROS:   Please see the history of present illness.    As stated in the HPI and negative for all other systems.   Prior CV studies:   The following studies were reviewed today:   EP study, ED notes, EKG  Labs/Other Tests and Data Reviewed:    EKG:  01/22/19 Sinus tach rate 115, axis and intervals WNL, no acute findings.   Recent Labs: 11/25/2018: BUN 11; Creatinine, Ser 0.84; Hemoglobin 13.7; Platelets 333; Potassium 4.0; Sodium 132   Recent Lipid Panel No results found for: CHOL, TRIG, HDL, CHOLHDL, LDLCALC, LDLDIRECT  Wt Readings from Last 3 Encounters:  03/03/19 147 lb (66.7 kg)  01/22/19 140 lb (63.5 kg)  11/29/18 144 lb (65.3 kg)     Objective:    Vital Signs:  Ht 5\' 7"  (1.702 m)   Wt 147 lb (66.7 kg)   BMI 23.02 kg/m    VITAL SIGNS:  reviewed GEN:  no acute distress EYES:  sclerae anicteric, EOMI - Extraocular Movements Intact NEURO:  alert and oriented x 3, no obvious focal deficit PSYCH:  normal affect  ASSESSMENT & PLAN:    PALPITATIONS:  I am going to ask her to get orthostatic blood pressure checks at her place of work.  She and I talked about this.  She is can increase her salt and hydration.  She is going to wear a Zio patch for 2 weeks.  She can remain off of beta-blocker which did not seem to help anyway.  She is not really taking that currently.  Further evaluation will be based on these results and I will see her back in the office after the monitor to discuss the next therapy.    COVID-19 Education: The signs and symptoms of COVID-19 were discussed with the patient and how to seek care for testing (follow up with PCP or arrange E-visit).  The importance of social distancing was discussed today.  Time:   Today, I have spent 25 minutes with the patient with telehealth technology discussing the above problems.     Medication Adjustments/Labs and Tests Ordered: Current medicines are reviewed at length with the patient today.  Concerns regarding medicines are outlined above.   Tests Ordered: No orders of the defined types were placed in this encounter.   Medication Changes: No orders of the defined types were placed in this  encounter.   Disposition:  Follow up see me after the monitor  Signed, Rollene Rotunda, MD  03/03/2019 12:34 PM    [facility]

## 2019-03-03 ENCOUNTER — Telehealth (INDEPENDENT_AMBULATORY_CARE_PROVIDER_SITE_OTHER): Payer: 59 | Admitting: Cardiology

## 2019-03-03 ENCOUNTER — Encounter: Payer: Self-pay | Admitting: Cardiology

## 2019-03-03 VITALS — Ht 67.0 in | Wt 147.0 lb

## 2019-03-03 DIAGNOSIS — R000 Tachycardia, unspecified: Secondary | ICD-10-CM

## 2019-03-03 NOTE — Patient Instructions (Addendum)
Medication Instructions:  Continue current medications  If you need a refill on your cardiac medications before your next appointment, please call your pharmacy.  Labwork: None Ordered   Testing/Procedures: Your physician has recommended that you wear a Zio Patch monitor for 2 weeks. Event monitors are medical devices that record the heart's electrical activity. Doctors most often [place] these monitors to diagnose arrhythmias. Arrhythmias are problems with the speed or rhythm of the heartbeat. The monitor is a small, portable device. You can wear one while you do your normal daily activities. This is usually used to diagnose what is causing palpitations/syncope (passing out).  . Follow-Up:  Your physician recommends that you schedule a follow-up appointment in: After Zio Patch   At [facility], you and your health needs are our priority.  As part of our continuing mission to provide you with exceptional heart care, we have created designated Provider Care Teams.  These Care Teams include your primary Cardiologist (physician) and Advanced Practice Providers (APPs -  Physician Assistants and Nurse Practitioners) who all work together to provide you with the care you need, when you need it.  Thank you for choosing [facility] at [facility]!!

## 2019-03-03 NOTE — Telephone Encounter (Signed)
° ° °  Patient called to confirm appt, changed to virtual visit. TELEPHONE visit request

## 2019-03-04 ENCOUNTER — Telehealth: Payer: Self-pay | Admitting: Radiology

## 2019-03-04 NOTE — Addendum Note (Signed)
Addended by: Barrie Dunker on: 03/04/2019 11:56 AM   Modules accepted: Orders

## 2019-03-04 NOTE — Telephone Encounter (Signed)
Enrolled patient for a 14 day ZIo long term monitor to be mailed due to Covid-19. Brief instructions were gone over with patient and she knows to expect the monitor to arrive in 3-4 days.

## 2019-03-07 ENCOUNTER — Ambulatory Visit (INDEPENDENT_AMBULATORY_CARE_PROVIDER_SITE_OTHER): Payer: 59

## 2019-03-07 DIAGNOSIS — R000 Tachycardia, unspecified: Secondary | ICD-10-CM

## 2019-03-13 ENCOUNTER — Telehealth: Payer: Self-pay | Admitting: Cardiology

## 2019-03-13 NOTE — Telephone Encounter (Signed)
New Message  Patient states she was told to see Dr. Antoine Poche in office after [facility] Patch.  Just confirming whether or not to schedule in office or virtual appointment.

## 2019-03-20 NOTE — Telephone Encounter (Signed)
Pt voicemail box is not setup.

## 2019-03-21 NOTE — Telephone Encounter (Signed)
appt schedule 06/11

## 2019-04-02 ENCOUNTER — Telehealth: Payer: Self-pay | Admitting: Cardiology

## 2019-04-02 ENCOUNTER — Other Ambulatory Visit: Payer: Self-pay

## 2019-04-02 NOTE — Telephone Encounter (Signed)
Please let her know that she should contact her primary provider for a work excuse.

## 2019-04-02 NOTE — Telephone Encounter (Signed)
Pt aware Dr Antoine Poche has Michelle Vincent schedule today and has not reviewed messages at this time Will let pt know once Dr Antoine Poche responds to messages ./cy

## 2019-04-02 NOTE — Telephone Encounter (Signed)
My Chart message sent

## 2019-04-02 NOTE — Telephone Encounter (Signed)
Called patient in response to MyChart message.   She has intermittent chest pain, SOB, dizziness. She reports sharp chest pain - no triggers. She reports her HR is up and down. Her monitor report is in Epic. She reports these symptoms are similar to previous. 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   Will route to Dr. Antoine Poche & [facility] CMA

## 2019-04-02 NOTE — Telephone Encounter (Signed)
New Message     Pt is calling and is needing a Drs note for work    Please call

## 2019-04-10 ENCOUNTER — Other Ambulatory Visit: Payer: Self-pay

## 2019-04-10 ENCOUNTER — Encounter: Payer: Self-pay | Admitting: Family Medicine

## 2019-04-10 ENCOUNTER — Ambulatory Visit (INDEPENDENT_AMBULATORY_CARE_PROVIDER_SITE_OTHER): Payer: 59 | Admitting: Family Medicine

## 2019-04-10 VITALS — BP 114/64 | HR 89 | Ht 67.0 in | Wt 157.0 lb

## 2019-04-10 DIAGNOSIS — Z3046 Encounter for surveillance of implantable subdermal contraceptive: Secondary | ICD-10-CM

## 2019-04-10 NOTE — Progress Notes (Signed)
Nexplanon Removal:  Patient given informed consent for removal of her Implanon, time out was performed.  Signed copy in the chart.  Appropriate time out taken. Implanon site identified.  Area prepped in usual sterile fashon. One cc of 1% lidocaine was used to anesthetize the area at the distal end of the implant. A small stab incision was made right beside the implant on the distal portion.  The implanon rod was grasped using hemostats and removed without difficulty.  There was less than 3 cc blood loss. There were no complications.  A small amount of antibiotic ointment and steri-strips were applied over the small incision.  A pressure bandage was applied to reduce any bruising.  The patient tolerated the procedure well and was given post procedure instructions. 

## 2019-04-11 ENCOUNTER — Telehealth: Payer: Self-pay | Admitting: Cardiology

## 2019-04-11 NOTE — Telephone Encounter (Signed)
Unable to reach pt voice mailbox not set up Will try later .Zack Seal

## 2019-04-14 NOTE — Telephone Encounter (Signed)
Called x3 for pre reg °

## 2019-04-15 ENCOUNTER — Other Ambulatory Visit: Payer: Self-pay

## 2019-04-15 ENCOUNTER — Telehealth (INDEPENDENT_AMBULATORY_CARE_PROVIDER_SITE_OTHER): Payer: 59 | Admitting: Cardiology

## 2019-04-15 ENCOUNTER — Encounter: Payer: Self-pay | Admitting: Cardiology

## 2019-04-15 ENCOUNTER — Other Ambulatory Visit: Payer: Self-pay | Admitting: Cardiology

## 2019-04-15 VITALS — Ht 67.0 in | Wt 157.0 lb

## 2019-04-15 DIAGNOSIS — R000 Tachycardia, unspecified: Secondary | ICD-10-CM

## 2019-04-15 DIAGNOSIS — R002 Palpitations: Secondary | ICD-10-CM | POA: Diagnosis not present

## 2019-04-15 MED ORDER — PROPRANOLOL HCL 10 MG PO TABS: 10.0000 mg | ORAL_TABLET | ORAL | 11 refills | Status: DC | PRN

## 2019-04-15 NOTE — Patient Instructions (Signed)
Medication Instructions:  START- Propranolol 10 mg as needed  If you need a refill on your cardiac medications before your next appointment, please call your pharmacy.  Labwork: None Ordered   Testing/Procedures: None Ordered  Follow-Up: You will need a follow up appointment in 3 months.  Please call our office 2 months in advance to schedule this appointment.  You may see Minus Breeding, MD or one of the following Advanced Practice Providers on your designated Care Team:   Rosaria Ferries, PA-C . Jory Sims, DNP, ANP     At [facility], you and your health needs are our priority.  As part of our continuing mission to provide you with exceptional heart care, we have created designated Provider Care Teams.  These Care Teams include your primary Cardiologist (physician) and Advanced Practice Providers (APPs -  Physician Assistants and Nurse Practitioners) who all work together to provide you with the care you need, when you need it.  Thank you for choosing [facility] at [facility]!!

## 2019-04-15 NOTE — Progress Notes (Signed)
Virtual Visit via Video Note   This visit type was conducted due to national recommendations for restrictions regarding the COVID-19 Pandemic (e.g. social distancing) in an effort to limit this patient's exposure and mitigate transmission in our community.  Due to her co-morbid illnesses, this patient is at least at moderate risk for complications without adequate follow up.  This format is felt to be most appropriate for this patient at this time.  All issues noted in this document were discussed and addressed.  A limited physical exam was performed with this format.  Please refer to the patient's chart for her consent to telehealth for [facility].   Date:  04/15/2019   ID:  Michelle Vincent, DOB [birth year], MRN [id number]  Patient Location: Home Provider Location: Home  PCP:  Alfonso Ellis, NP  Cardiologist:  Minus Breeding, MD  Electrophysiologist:  None   Evaluation Performed:  Follow-Up Visit  Chief Complaint:  Palpitations  History of Present Illness:    Michelle Vincent is a 28 y.o. female with palpitations as described in the previous note.   She wore an event monitor and I reviewed this with her.  She wore her monitor and she did describe lots of symptoms of palpitations and weakness and chest discomfort.  Her average heart rate was about 98.  Her lowest heart rate was 60 which was about 1:30 in the afternoon.  Her most rapid heart rate was 185 1 evening.  She thought she was at a friend's house playing cards.  She does not describe any presyncope or syncope since then.  She says the episodes as described previously are still happening.  She was not able to get orthostatic readings as I had requested.  She does work as an Neurosurgeon.  The patient does not have symptoms concerning for COVID-19 infection (fever, chills, cough, or new shortness of breath).    Past Medical History:  Diagnosis Date  . HA (headache)   . Occipital neuralgia   . Sinus tachycardia   . SVT  (supraventricular tachycardia) (HCC)    Past Surgical History:  Procedure Laterality Date  . shoulder Right   . TONSILLECTOMY AND ADENOIDECTOMY       Current Meds  Medication Sig  . Erenumab-aooe (AIMOVIG) 140 MG/ML SOAJ Inject 140 mg into the skin every 30 (thirty) days.  . ferrous sulfate 325 (65 FE) MG tablet Take 325 mg by mouth 3 (three) times daily.  . methocarbamol (ROBAXIN) 750 MG tablet Take 750 mg by mouth 3 (three) times daily as needed (for headache).   . ondansetron (ZOFRAN) 8 MG tablet Take 8 mg by mouth every 8 (eight) hours as needed for nausea or vomiting.      Allergies:   Nitrous oxide; Shellfish allergy; and Reglan [metoclopramide]   Social History   Tobacco Use  . Smoking status: Never Smoker  . Smokeless tobacco: Never Used  Substance Use Topics  . Alcohol use: Yes    Alcohol/week: 1.0 standard drinks    Types: 1 Glasses of wine per week    Frequency: Never  . Drug use: Never     Family Hx: The patient's family history includes Breast cancer in her maternal aunt; Cancer in her maternal aunt; Diabetes in her brother; Hypertension in her mother; Transient ischemic attack in her mother.  ROS:   Please see the history of present illness.    Review of Systems  All other systems reviewed and are negative.   Prior CV studies:   The  following studies were reviewed today: Event monitor  Labs/Other Tests and Data Reviewed:    EKG:  No ECG reviewed.  Recent Labs: 11/25/2018: BUN 11; Creatinine, Ser 0.84; Hemoglobin 13.7; Platelets 333; Potassium 4.0; Sodium 132   Recent Lipid Panel No results found for: CHOL, TRIG, HDL, CHOLHDL, LDLCALC, LDLDIRECT  Wt Readings from Last 3 Encounters:  04/15/19 157 lb (71.2 kg)  04/10/19 157 lb 0.6 oz (71.2 kg)  03/03/19 147 lb (66.7 kg)     Objective:    Vital Signs:  Ht 5\' 7"  (1.702 m)   Wt 157 lb (71.2 kg)   BMI 24.59 kg/m    VITAL SIGNS:  reviewed GEN:  no acute distress NEURO:  alert and oriented x 3,  no obvious focal deficit PSYCH:  normal affect  ASSESSMENT & PLAN:    PALPITATIONS:  We reviewed this at length.  At this point I don't see a primary dysrhythmia other than sinus tach.  I am going to empirically treat this with propranolol.  She is can still try to get the orthostatic blood pressure readings.  Further management will be based on her response to this and I plan to see her back in the office in about 3 months to reevaluate symptom   Time:   Today, I have spent 16 minutes with the patient with telehealth technology discussing the above problems.     Medication Adjustments/Labs and Tests Ordered: Current medicines are reviewed at length with the patient today.  Concerns regarding medicines are outlined above.   Tests Ordered: No orders of the defined types were placed in this encounter.   Medication Changes: No orders of the defined types were placed in this encounter.   Disposition:  Follow up three months in the clinic  Signed, Rollene RotundaJames Genavie Boettger, MD  04/15/2019 3:30 PM    [facility]

## 2019-04-15 NOTE — Telephone Encounter (Signed)
Called Pt to change appt from June 11, to have an appt today at 3:20

## 2019-04-17 ENCOUNTER — Telehealth: Payer: 59 | Admitting: Cardiology

## 2019-04-30 NOTE — Telephone Encounter (Signed)
Pt had office visit since this message was received .Adonis Housekeeper

## 2019-05-18 ENCOUNTER — Emergency Department (HOSPITAL_BASED_OUTPATIENT_CLINIC_OR_DEPARTMENT_OTHER): Payer: 59

## 2019-05-18 ENCOUNTER — Other Ambulatory Visit: Payer: Self-pay

## 2019-05-18 ENCOUNTER — Encounter (HOSPITAL_BASED_OUTPATIENT_CLINIC_OR_DEPARTMENT_OTHER): Payer: Self-pay

## 2019-05-18 ENCOUNTER — Emergency Department (HOSPITAL_BASED_OUTPATIENT_CLINIC_OR_DEPARTMENT_OTHER)
Admission: EM | Admit: 2019-05-18 | Discharge: 2019-05-18 | Disposition: A | Discharge status: Home / Self Care | Payer: 59 | Attending: Emergency Medicine | Admitting: Emergency Medicine

## 2019-05-18 DIAGNOSIS — R1032 Left lower quadrant pain: Secondary | ICD-10-CM | POA: Insufficient documentation

## 2019-05-18 DIAGNOSIS — N83202 Unspecified ovarian cyst, left side: Secondary | ICD-10-CM

## 2019-05-18 DIAGNOSIS — N83292 Other ovarian cyst, left side: Secondary | ICD-10-CM | POA: Insufficient documentation

## 2019-05-18 LAB — CBC
HCT: 38.9 % (ref 36.0–46.0)
Hemoglobin: 13.2 g/dL (ref 12.0–15.0)
MCH: 30.2 pg (ref 26.0–34.0)
MCHC: 33.9 g/dL (ref 30.0–36.0)
MCV: 89 fL (ref 80.0–100.0)
Platelets: 275 10*3/uL (ref 150–400)
RBC: 4.37 MIL/uL (ref 3.87–5.11)
RDW: 11.8 % (ref 11.5–15.5)
WBC: 4.9 10*3/uL (ref 4.0–10.5)
nRBC: 0 % (ref 0.0–0.2)

## 2019-05-18 LAB — COMPREHENSIVE METABOLIC PANEL
ALT: 11 U/L (ref 0–44)
AST: 14 U/L — ABNORMAL LOW (ref 15–41)
Albumin: 4.6 g/dL (ref 3.5–5.0)
Alkaline Phosphatase: 44 U/L (ref 38–126)
Anion gap: 10 (ref 5–15)
BUN: 12 mg/dL (ref 6–20)
CO2: 23 mmol/L (ref 22–32)
Calcium: 9.4 mg/dL (ref 8.9–10.3)
Chloride: 103 mmol/L (ref 98–111)
Creatinine, Ser: 0.75 mg/dL (ref 0.44–1.00)
GFR calc Af Amer: >60 mL/min (ref >60)
GFR calc non Af Amer: >60 mL/min (ref >60)
Glucose, Bld: 96 mg/dL (ref 70–99)
Potassium: 3.9 mmol/L (ref 3.5–5.1)
Sodium: 136 mmol/L (ref 135–145)
Total Bilirubin: 0.7 mg/dL (ref 0.3–1.2)
Total Protein: 7.3 g/dL (ref 6.5–8.1)

## 2019-05-18 LAB — URINALYSIS, ROUTINE W REFLEX MICROSCOPIC
Bilirubin Urine: NEGATIVE
Glucose, UA: NEGATIVE mg/dL
Hgb urine dipstick: NEGATIVE
Ketones, ur: 15 mg/dL — AB
Leukocytes,Ua: NEGATIVE
Nitrite: NEGATIVE
Protein, ur: NEGATIVE mg/dL
Specific Gravity, Urine: >1.03 — ABNORMAL HIGH (ref 1.005–1.030)
pH: 5.5 (ref 5.0–8.0)

## 2019-05-18 LAB — LIPASE, BLOOD: Lipase: 31 U/L (ref 11–51)

## 2019-05-18 LAB — PREGNANCY, URINE: Preg Test, Ur: NEGATIVE

## 2019-05-18 MED ORDER — HYDROCODONE-ACETAMINOPHEN 5-325 MG PO TABS
1.0000 | ORAL_TABLET | Freq: Once | ORAL | Status: AC
  Administered 2019-05-18: 1 via ORAL
  Filled 2019-05-18: qty 1

## 2019-05-18 MED ORDER — IOHEXOL 300 MG/ML  SOLN
100.0000 mL | Freq: Once | INTRAMUSCULAR | Status: AC | PRN
  Administered 2019-05-18: 100 mL via INTRAVENOUS

## 2019-05-18 MED ORDER — SODIUM CHLORIDE 0.9 % IV BOLUS
1000.0000 mL | Freq: Once | INTRAVENOUS | Status: AC
  Administered 2019-05-18: 1000 mL via INTRAVENOUS

## 2019-05-18 MED ORDER — HYDROCODONE-ACETAMINOPHEN 5-325 MG PO TABS: ORAL_TABLET | ORAL | 0 refills | Status: DC

## 2019-05-18 MED ORDER — ONDANSETRON HCL 4 MG/2ML IJ SOLN
4.0000 mg | Freq: Once | INTRAMUSCULAR | Status: AC
  Administered 2019-05-18: 4 mg via INTRAVENOUS
  Filled 2019-05-18: qty 2

## 2019-05-18 MED ORDER — NAPROXEN 500 MG PO TABS: 500.0000 mg | ORAL_TABLET | Freq: Two times a day (BID) | ORAL | 0 refills | Status: DC

## 2019-05-18 MED ORDER — SODIUM CHLORIDE 0.9% FLUSH
3.0000 mL | Freq: Once | INTRAVENOUS | Status: DC
  Filled 2019-05-18: qty 3

## 2019-05-18 MED ORDER — HYDROMORPHONE HCL 1 MG/ML IJ SOLN
0.5000 mg | Freq: Once | INTRAMUSCULAR | Status: AC
  Administered 2019-05-18: 0.5 mg via INTRAVENOUS
  Filled 2019-05-18: qty 1

## 2019-05-18 NOTE — ED Triage Notes (Signed)
Pt reports LLQ pain "like period cramps" w/ Nausea. Pt denies Diarrhea/ urinary symptoms/vaginal symptoms.

## 2019-05-18 NOTE — ED Provider Notes (Signed)
[place] EMERGENCY DEPARTMENT Provider Note   CSN: [id number] Arrival date & time: 05/18/19  1655     History   Chief Complaint Chief Complaint  Patient presents with  . Abdominal Pain    HPI Michelle Vincent is a 28 y.o. female.     Patient with no past surgical history presents to the emergency department with complaint of nausea and left lower quadrant abdominal pain described as sharp and stabbing and severe at times over the past 4 days.  Symptoms started more as nausea and decreased appetite.  She has had worsening left lower quadrant pain over the past 2 days.  She has had one episode of vomiting total.  No chest pain or shortness of breath.  No fevers or URI symptoms.  Patient denies any dysuria, hematuria, increased frequency or urgency.  She states she has had ruptured ovarian cysts in the past.  She denies any vaginal bleeding or discharge.  She had a Nexplanon removed in the past 2 months.  She states that she is currently trying to get pregnant.  She has been taking ibuprofen and Tylenol without any relief of symptoms.  Onset of symptoms acute.  Course is worsening.  Nothing makes symptoms better.     Past Medical History:  Diagnosis Date  . HA (headache)   . Occipital neuralgia   . Sinus tachycardia   . SVT (supraventricular tachycardia) [facility])     Patient Active Problem List   Diagnosis Date Noted  . Tachycardia 03/03/2019    Past Surgical History:  Procedure Laterality Date  . shoulder Right   . TONSILLECTOMY AND ADENOIDECTOMY       OB History    Gravida  0   Para  0   Term  0   Preterm  0   AB  0   Living  0     SAB  0   TAB  0   Ectopic  0   Multiple  0   Live Births  0            Home Medications    Prior to Admission medications   Medication Sig Start Date End Date Taking? Authorizing Provider  amitriptyline (ELAVIL) 25 MG tablet 1-2 tablets at bedtime 02/25/19   [provider]  Erenumab-aooe  (AIMOVIG) 140 MG/ML SOAJ Inject 140 mg into the skin every 30 (thirty) days. 12/11/18 12/12/19  [provider]  ferrous sulfate 325 (65 FE) MG tablet Take 325 mg by mouth 3 (three) times daily. 12/04/14   [provider]  methocarbamol (ROBAXIN) 750 MG tablet Take 750 mg by mouth 3 (three) times daily as needed (for headache).  11/25/18 11/26/19  [provider]  metoprolol succinate (TOPROL-XL) 25 MG 24 hr tablet Take 25 mg by mouth daily. 08/26/18   [provider]  ondansetron (ZOFRAN) 8 MG tablet Take 8 mg by mouth every 8 (eight) hours as needed for nausea or vomiting.  11/25/18 11/25/19  [provider]  propranolol (INDERAL) 10 MG tablet TAKE 1 TABLET BY MOUTH DAILY AS NEEDED 04/15/19   Minus Breeding, MD    Family History Family History  Problem Relation Age of Onset  . Hypertension Mother   . Transient ischemic attack Mother   . Diabetes Brother        Type 1  . Breast cancer Maternal Aunt   . Cancer Maternal Aunt     Social History Social History   Tobacco Use  . Smoking  status: Never Smoker  . Smokeless tobacco: Never Used  Substance Use Topics  . Alcohol use: Yes    Alcohol/week: 1.0 standard drinks    Types: 1 Glasses of wine per week    Frequency: Never  . Drug use: Never     Allergies   Nitrous oxide, Shellfish allergy, and Reglan [metoclopramide]   Review of Systems Review of Systems  Constitutional: Negative for fever.  HENT: Negative for rhinorrhea and sore throat.   Eyes: Negative for redness.  Respiratory: Negative for cough.   Cardiovascular: Negative for chest pain.  Gastrointestinal: Positive for abdominal pain, nausea and vomiting. Negative for diarrhea.  Genitourinary: Negative for dysuria, flank pain, frequency, hematuria, urgency, vaginal bleeding and vaginal discharge.  Musculoskeletal: Negative for myalgias.  Skin: Negative for rash.  Neurological: Negative for headaches.     Physical Exam Updated  Vital Signs BP 124/69   Pulse 98   Temp 98 F (36.7 C)   Resp 17   Ht 5\' 7"  (1.702 m)   Wt 68 kg   LMP 05/07/2019   SpO2 99%   BMI 23.49 kg/m   Physical Exam Vitals signs and nursing note reviewed.  Constitutional:      Appearance: She is well-developed.  HENT:     Head: Normocephalic and atraumatic.  Eyes:     General:        Right eye: No discharge.        Left eye: No discharge.     Conjunctiva/sclera: Conjunctivae normal.  Neck:     Musculoskeletal: Normal range of motion and neck supple.  Cardiovascular:     Rate and Rhythm: Normal rate and regular rhythm.     Heart sounds: Normal heart sounds.  Pulmonary:     Effort: Pulmonary effort is normal.     Breath sounds: Normal breath sounds.  Abdominal:     Palpations: Abdomen is soft.     Tenderness: There is abdominal tenderness (moderate) in the left lower quadrant. There is no guarding or rebound.  Skin:    General: Skin is warm and dry.  Neurological:     Mental Status: She is alert.      ED Treatments / Results  Labs (all labs ordered are listed, but only abnormal results are displayed) Labs Reviewed  COMPREHENSIVE METABOLIC PANEL - Abnormal; Notable for the following components:      Result Value   AST 14 (*)    All other components within normal limits  URINALYSIS, ROUTINE W REFLEX MICROSCOPIC - Abnormal; Notable for the following components:   Specific Gravity, Urine >1.030 (*)    Ketones, ur 15 (*)    All other components within normal limits  LIPASE, BLOOD  CBC  PREGNANCY, URINE    EKG None  Radiology Ct Abdomen Pelvis W Contrast  Result Date: 05/18/2019 CLINICAL DATA:  Left lower quadrant pain EXAM: CT ABDOMEN AND PELVIS WITH CONTRAST TECHNIQUE: Multidetector CT imaging of the abdomen and pelvis was performed using the standard protocol following bolus administration of intravenous contrast. CONTRAST:  100mL OMNIPAQUE IOHEXOL 300 MG/ML  SOLN COMPARISON:  None. FINDINGS: Lower chest: No  acute abnormality. Epicardial fat pad is noted on the right Hepatobiliary: No focal liver abnormality is seen. No gallstones, gallbladder wall thickening, or biliary dilatation. Pancreas: Unremarkable. No pancreatic ductal dilatation or surrounding inflammatory changes. Spleen: Normal in size without focal abnormality. Adrenals/Urinary Tract: Adrenal glands are within normal limits. Kidneys are well visualized bilaterally without renal calculi or obstructive changes. The bladder  is decompressed. Stomach/Bowel: No obstructive or inflammatory changes of the colon are seen. The appendix is within normal limits. No small bowel abnormality is noted. Stomach is unremarkable. Vascular/Lymphatic: No significant vascular findings are present. No enlarged abdominal or pelvic lymph nodes. Circumaortic left renal vein is noted. Reproductive: Uterus is within normal limits. Fluid is noted in the endometrial canal likely related to patient's menstrual status. There is a 2 cm cyst in the left ovary. Other: No abdominal wall hernia or abnormality. No abdominopelvic ascites. Musculoskeletal: No acute or significant osseous findings. IMPRESSION: 2 cm left ovarian cyst without complicating factors. No other focal abnormality is noted. Electronically Signed   By: Alcide CleverMark  Lukens M.D.   On: 05/18/2019 20:33    Procedures Procedures (including critical care time)  Medications Ordered in ED Medications  sodium chloride flush (NS) 0.9 % injection 3 mL (3 mLs Intravenous Not Given 05/18/19 1815)  HYDROmorphone (DILAUDID) injection 0.5 mg (0.5 mg Intravenous Given 05/18/19 1937)  ondansetron (ZOFRAN) injection 4 mg (4 mg Intravenous Given 05/18/19 1937)  sodium chloride 0.9 % bolus 1,000 mL ( Intravenous Stopped 05/18/19 2041)  iohexol (OMNIPAQUE) 300 MG/ML solution 100 mL (100 mLs Intravenous Contrast Given 05/18/19 2008)  HYDROcodone-acetaminophen (NORCO/VICODIN) 5-325 MG per tablet 1 tablet (1 tablet Oral Given 05/18/19 2053)      Initial Impression / Assessment and Plan / ED Course  I have reviewed the triage vital signs and the nursing notes.  Pertinent labs & imaging results that were available during my care of the patient were reviewed by me and considered in my medical decision making (see chart for details).        Patient seen and examined.  Work-up to this point has been reassuring except for signs of dehydration.  No vaginal bleeding or discharge reported.  She is very tender in the left lower quadrant.  I do not suspect ovarian torsion.  We do not have ultrasound available at the current time.  We will start with CT of the abdomen pelvis to look for possible etiologies of pain such as colitis, diverticulitis, ovarian cyst, abscess.  Will treat pain and hydrate with IV fluids.  Vital signs reviewed and are as follows: BP 124/69   Pulse 98   Temp 98 F (36.7 C)   Resp 17   Ht 5\' 7"  (1.702 m)   Wt 68 kg   LMP 05/07/2019   SpO2 99%   BMI 23.49 kg/m   8:55 PM CT images personally reviewed and interpreted.    Patient updated on results.  Pain improved to a 4 out of 10.  Will give dose of oral Vicodin prior to discharge.  Encouraged PCP/GYN follow-up this week for further evaluation.  Home with a prescription for #6 Vicodin 5-325 mg.  Also naproxen.  Patient counseled on use of narcotic pain medications. Counseled not to combine these medications with others containing tylenol. Urged not to drink alcohol, drive, or perform any other activities that requires focus while taking these medications. The patient verbalizes understanding and agrees with the plan.  The patient was urged to return to the Emergency Department immediately with worsening of current symptoms, worsening abdominal pain, persistent vomiting, blood noted in stools, fever, or any other concerns. The patient verbalized understanding.    Final Clinical Impressions(s) / ED Diagnoses   Final diagnoses:  Left lower quadrant abdominal pain   Cyst of left ovary   Patient with left sided periumbilical to pelvic pain, no vaginal symptoms.  Lab work  is reassuring.  CT imaging performed showing left sided ovarian cyst consistent with area of pain.  Do not suspect ovarian torsion.  No vomiting.  Patient appears well, pain controlled in the ED.  She is tolerating oral fluids.  We will discharged home with pain control, PCP follow-up, return instructions as above.  ED Discharge Orders         Ordered    HYDROcodone-acetaminophen (NORCO/VICODIN) 5-325 MG tablet     05/18/19 2053    naproxen (NAPROSYN) 500 MG tablet  2 times daily     05/18/19 2053           Renne CriglerGeiple, Setareh Rom, PA-C 05/18/19 2056    Virgina Norfolkuratolo, Adam, DO 05/18/19 2341

## 2019-05-18 NOTE — Discharge Instructions (Signed)
Please read and follow all provided instructions.  Your diagnoses today include:  1. Left lower quadrant abdominal pain   2. Cyst of left ovary     Tests performed today include:  Blood counts and electrolytes  Blood tests to check liver and kidney function  Blood tests to check pancreas function  Urine test to look for infection and pregnancy (in women) - showed dehydration  CT scan - shows left ovarian cyst, otherwise no problems  Vital signs. See below for your results today.   Medications prescribed:   Naproxen - anti-inflammatory pain medication  Do not exceed 500mg  naproxen every 12 hours, take with food  You have been prescribed an anti-inflammatory medication or NSAID. Take with food. Take smallest effective dose for the shortest duration needed for your pain. Stop taking if you experience stomach pain or vomiting.    Vicodin (hydrocodone/acetaminophen) - narcotic pain medication  DO NOT drive or perform any activities that require you to be awake and alert because this medicine can make you drowsy. BE VERY CAREFUL not to take multiple medicines containing Tylenol (also called acetaminophen). Doing so can lead to an overdose which can damage your liver and cause liver failure and possibly death.  Take any prescribed medications only as directed.  Home care instructions:   Follow any educational materials contained in this packet.  Follow-up instructions: Please follow-up with your primary care provider in the next 3 days for further evaluation of your symptoms.    Return instructions:  SEEK IMMEDIATE MEDICAL ATTENTION IF:  The pain does not go away or becomes severe   A temperature above 101F develops   Repeated vomiting occurs (multiple episodes)   The pain becomes localized to portions of the abdomen. The right side could possibly be appendicitis. In an adult, the left lower portion of the abdomen could be colitis or diverticulitis.   Blood is being  passed in stools or vomit (bright red or black tarry stools)   You develop chest pain, difficulty breathing, dizziness or fainting, or become confused, poorly responsive, or inconsolable (young children)  If you have any other emergent concerns regarding your health  Additional Information: Abdominal (belly) pain can be caused by many things. Your caregiver performed an examination and possibly ordered blood/urine tests and imaging (CT scan, x-rays, ultrasound). Many cases can be observed and treated at home after initial evaluation in the emergency department. Even though you are being discharged home, abdominal pain can be unpredictable. Therefore, you need a repeated exam if your pain does not resolve, returns, or worsens. Most patients with abdominal pain don't have to be admitted to the hospital or have surgery, but serious problems like appendicitis and gallbladder attacks can start out as nonspecific pain. Many abdominal conditions cannot be diagnosed in one visit, so follow-up evaluations are very important.  Your vital signs today were: BP 124/69    Pulse 98    Temp 98 F (36.7 C)    Resp 17    Ht 5\' 7"  (1.702 m)    Wt 68 kg    LMP 05/07/2019    SpO2 99%    BMI 23.49 kg/m  If your blood pressure (bp) was elevated above 135/85 this visit, please have this repeated by your doctor within one month. --------------

## 2019-05-18 NOTE — ED Notes (Signed)
CT awaiting IV and pain meds for pt.  CT asked pt if she could be done w/o pain meds, pt refused.  Will wait until pain meds are administered; spoke with Charge RN, will call when pt is ready.

## 2019-05-19 ENCOUNTER — Other Ambulatory Visit: Payer: Self-pay

## 2019-05-19 ENCOUNTER — Encounter (HOSPITAL_BASED_OUTPATIENT_CLINIC_OR_DEPARTMENT_OTHER): Payer: Self-pay

## 2019-05-19 DIAGNOSIS — Z79899 Other long term (current) drug therapy: Secondary | ICD-10-CM | POA: Diagnosis not present

## 2019-05-19 DIAGNOSIS — N83202 Unspecified ovarian cyst, left side: Secondary | ICD-10-CM | POA: Insufficient documentation

## 2019-05-19 DIAGNOSIS — R1032 Left lower quadrant pain: Secondary | ICD-10-CM | POA: Diagnosis present

## 2019-05-19 DIAGNOSIS — R102 Pelvic and perineal pain: Secondary | ICD-10-CM | POA: Insufficient documentation

## 2019-05-19 NOTE — ED Triage Notes (Signed)
LLQ x 2 days. Seen yesterday, dx with cyst. Worsening pain since 1800 tonight. Took naproxen and vicodin at that time with minimal relief.

## 2019-05-20 ENCOUNTER — Emergency Department (HOSPITAL_COMMUNITY): Payer: 59

## 2019-05-20 ENCOUNTER — Encounter (HOSPITAL_COMMUNITY): Payer: Self-pay | Admitting: *Deleted

## 2019-05-20 ENCOUNTER — Emergency Department (HOSPITAL_BASED_OUTPATIENT_CLINIC_OR_DEPARTMENT_OTHER)
Admission: EM | Admit: 2019-05-20 | Discharge: 2019-05-20 | Disposition: A | Discharge status: Home / Self Care | Payer: 59 | Attending: Emergency Medicine | Admitting: Emergency Medicine

## 2019-05-20 ENCOUNTER — Other Ambulatory Visit: Payer: Self-pay

## 2019-05-20 DIAGNOSIS — R102 Pelvic and perineal pain: Secondary | ICD-10-CM

## 2019-05-20 DIAGNOSIS — R1032 Left lower quadrant pain: Secondary | ICD-10-CM

## 2019-05-20 MED ORDER — HYDROMORPHONE HCL 1 MG/ML IJ SOLN
2.0000 mg | Freq: Once | INTRAMUSCULAR | Status: AC
  Administered 2019-05-20: 2 mg via INTRAMUSCULAR
  Filled 2019-05-20: qty 2

## 2019-05-20 MED ORDER — DIPHENHYDRAMINE HCL 50 MG/ML IJ SOLN
25.0000 mg | Freq: Once | INTRAMUSCULAR | Status: AC
  Administered 2019-05-20: 25 mg via INTRAVENOUS
  Filled 2019-05-20: qty 1

## 2019-05-20 MED ORDER — HYDROCODONE-ACETAMINOPHEN 5-325 MG PO TABS: 1.0000 | ORAL_TABLET | ORAL | 0 refills | Status: DC | PRN

## 2019-05-20 MED ORDER — ONDANSETRON 8 MG PO TBDP
8.0000 mg | ORAL_TABLET | Freq: Once | ORAL | Status: AC
  Administered 2019-05-20: 8 mg via ORAL
  Filled 2019-05-20: qty 1

## 2019-05-20 MED ORDER — HYDROMORPHONE HCL 1 MG/ML IJ SOLN
1.0000 mg | Freq: Once | INTRAMUSCULAR | Status: AC
  Administered 2019-05-20: 1 mg via INTRAVENOUS
  Filled 2019-05-20: qty 1

## 2019-05-20 NOTE — ED Triage Notes (Signed)
Pt transferred here from [facility] for pelvic ultrasound. No acute distress noted on arrival.

## 2019-05-20 NOTE — ED Provider Notes (Signed)
Patient transferred here from [facility] for pelvis US.  She was seen in ED yesterday and had CT scan which showed 2cm left ovarian cyst.  She returned tonight due to worsening pain.  [facility] so [facility] for that.    [place] called and had to stop scan due to pain.  RN has taken dilaudid over to her in [place] to finish imaging.  Results for orders placed or performed during the hospital encounter of 05/18/19  Lipase, blood  Result Value Ref Range   Lipase 31 11 - 51 U/L  Comprehensive metabolic panel  Result Value Ref Range   Sodium 136 135 - 145 mmol/L   Potassium 3.9 3.5 - 5.1 mmol/L   Chloride 103 98 - 111 mmol/L   CO2 23 22 - 32 mmol/L   Glucose, Bld 96 70 - 99 mg/dL   BUN 12 6 - 20 mg/dL   Creatinine, Ser 0.75 0.44 - 1.00 mg/dL   Calcium 9.4 8.9 - 10.3 mg/dL   Total Protein 7.3 6.5 - 8.1 g/dL   Albumin 4.6 3.5 - 5.0 g/dL   AST 14 (L) 15 - 41 U/L   ALT 11 0 - 44 U/L   Alkaline Phosphatase 44 38 - 126 U/L   Total Bilirubin 0.7 0.3 - 1.2 mg/dL   GFR calc non Af Amer >60 >60 mL/min   GFR calc Af Amer >60 >60 mL/min   Anion gap 10 5 - 15  CBC  Result Value Ref Range   WBC 4.9 4.0 - 10.5 K/uL   RBC 4.37 3.87 - 5.11 MIL/uL   Hemoglobin 13.2 12.0 - 15.0 g/dL   HCT 38.9 36.0 - 46.0 %   MCV 89.0 80.0 - 100.0 fL   MCH 30.2 26.0 - 34.0 pg   MCHC 33.9 30.0 - 36.0 g/dL   RDW 11.8 11.5 - 15.5 %   Platelets 275 150 - 400 K/uL   nRBC 0.0 0.0 - 0.2 %  Urinalysis, Routine w reflex microscopic  Result Value Ref Range   Color, Urine YELLOW YELLOW   APPearance CLEAR CLEAR   Specific Gravity, Urine >1.030 (H) 1.005 - 1.030   pH 5.5 5.0 - 8.0   Glucose, UA NEGATIVE NEGATIVE mg/dL   Hgb urine dipstick NEGATIVE NEGATIVE   Bilirubin Urine NEGATIVE NEGATIVE   Ketones, ur 15 (A) NEGATIVE mg/dL   Protein, ur NEGATIVE NEGATIVE mg/dL   Nitrite NEGATIVE NEGATIVE   Leukocytes,Ua NEGATIVE NEGATIVE  Pregnancy, urine  Result Value Ref Range   Preg Test, Ur NEGATIVE  NEGATIVE   Ct Abdomen Pelvis W Contrast  Result Date: 05/18/2019 CLINICAL DATA:  Left lower quadrant pain EXAM: CT ABDOMEN AND PELVIS WITH CONTRAST TECHNIQUE: Multidetector CT imaging of the abdomen and pelvis was performed using the standard protocol following bolus administration of intravenous contrast. CONTRAST:  163mL OMNIPAQUE IOHEXOL 300 MG/ML  SOLN COMPARISON:  None. FINDINGS: Lower chest: No acute abnormality. Epicardial fat pad is noted on the right Hepatobiliary: No focal liver abnormality is seen. No gallstones, gallbladder wall thickening, or biliary dilatation. Pancreas: Unremarkable. No pancreatic ductal dilatation or surrounding inflammatory changes. Spleen: Normal in size without focal abnormality. Adrenals/Urinary Tract: Adrenal glands are within normal limits. Kidneys are well visualized bilaterally without renal calculi or obstructive changes. The bladder is decompressed. Stomach/Bowel: No obstructive or inflammatory changes of the colon are seen. The appendix is within normal limits. No small bowel abnormality is noted. Stomach is unremarkable. Vascular/Lymphatic: No significant vascular findings are  present. No enlarged abdominal or pelvic lymph nodes. Circumaortic left renal vein is noted. Reproductive: Uterus is within normal limits. Fluid is noted in the endometrial canal likely related to patient's menstrual status. There is a 2 cm cyst in the left ovary. Other: No abdominal wall hernia or abnormality. No abdominopelvic ascites. Musculoskeletal: No acute or significant osseous findings. IMPRESSION: 2 cm left ovarian cyst without complicating factors. No other focal abnormality is noted. Electronically Signed   By: Alcide CleverMark  Lukens M.D.   On: 05/18/2019 20:33   [place] Pelvic Doppler (torsion R/o Or Mass Arterial Flow)  Result Date: 05/20/2019 CLINICAL DATA:  Pelvic pain EXAM: TRANSABDOMINAL AND TRANSVAGINAL ULTRASOUND OF PELVIS DOPPLER ULTRASOUND OF OVARIES TECHNIQUE: Both transabdominal and  transvaginal ultrasound examinations of the pelvis were performed. Transabdominal technique was performed for global imaging of the pelvis including uterus, ovaries, adnexal regions, and pelvic cul-de-sac. It was necessary to proceed with endovaginal exam following the transabdominal exam to visualize the endometrium and ovaries. Color and duplex Doppler ultrasound was utilized to evaluate blood flow to the ovaries. COMPARISON:  Abdomen and pelvis CT from 2 days ago FINDINGS: Uterus Measurements: 9 x 3 x 5 cm = volume: Approximately 80 mL. No fibroids or other mass visualized. Nabothian cysts Endometrium Thickness: 15 mm.  No focal abnormality visualized. Right ovary Measurements: 31 x 14 x 20 mm = volume: 4.6 mL. Normal appearance/no adnexal mass. Left ovary Measurements: 37 x 27 x 34 mm = volume: 17 mL. Dominant follicle measuring up to 2.4 cm. Pulsed Doppler evaluation of both ovaries demonstrates normal low-resistance arterial and venous waveforms. Other findings No abnormal free fluid. IMPRESSION: No acute finding.  Normal ovarian blood flow. Electronically Signed   By: Marnee SpringJonathon  Watts M.D.   On: 05/20/2019 05:18   [place] Pelvic Complete With Transvaginal  Result Date: 05/20/2019 CLINICAL DATA:  Pelvic pain EXAM: TRANSABDOMINAL AND TRANSVAGINAL ULTRASOUND OF PELVIS DOPPLER ULTRASOUND OF OVARIES TECHNIQUE: Both transabdominal and transvaginal ultrasound examinations of the pelvis were performed. Transabdominal technique was performed for global imaging of the pelvis including uterus, ovaries, adnexal regions, and pelvic cul-de-sac. It was necessary to proceed with endovaginal exam following the transabdominal exam to visualize the endometrium and ovaries. Color and duplex Doppler ultrasound was utilized to evaluate blood flow to the ovaries. COMPARISON:  Abdomen and pelvis CT from 2 days ago FINDINGS: Uterus Measurements: 9 x 3 x 5 cm = volume: Approximately 80 mL. No fibroids or other mass visualized. Nabothian  cysts Endometrium Thickness: 15 mm.  No focal abnormality visualized. Right ovary Measurements: 31 x 14 x 20 mm = volume: 4.6 mL. Normal appearance/no adnexal mass. Left ovary Measurements: 37 x 27 x 34 mm = volume: 17 mL. Dominant follicle measuring up to 2.4 cm. Pulsed Doppler evaluation of both ovaries demonstrates normal low-resistance arterial and venous waveforms. Other findings No abnormal free fluid. IMPRESSION: No acute finding.  Normal ovarian blood flow. Electronically Signed   By: Marnee SpringJonathon  Watts M.D.   On: 05/20/2019 05:18   US with cyst but no complicating features.  Symptoms controlled here.  Will need to follow-up with OB-GYN.  Can return here for any new/acute changes.   Garlon HatchetSanders, Merrill Villarruel M, PA-C 05/20/19 [id number]    Marily MemosMesner, Jason, MD 05/20/19 [phone]

## 2019-05-20 NOTE — ED Provider Notes (Signed)
[facility] Provider Note: Lowella DellJ. Lane Romulo Okray, MD, FACEP  CSN: [id number] MRN: [id number] ARRIVAL: 05/19/19 at 2311 ROOM: MH11/MH11   CHIEF COMPLAINT  Abdominal Pain   HISTORY OF PRESENT ILLNESS  05/20/19 1:39 AM Sharen Heckabatha Mccathern is a 28 y.o. female with about a one-week history of left lower quadrant pain.  She was seen in the ED 2 days ago and a work-up, including CT CAT scan, showed a 2 cm left ovarian cyst.  Her pain had been adequately controlled on until yesterday evening about 6 PM despite taking ibuprofen and hydrocodone.  She now rates the pain is 10 out of 10, locates it in the left lower quadrant as before, and describes it as sharp.  It is worse with movement or palpation.  She denies vaginal bleeding or discharge.   Past Medical History:  Diagnosis Date   HA (headache)    Occipital neuralgia    Sinus tachycardia    SVT (supraventricular tachycardia) (HCC)     Past Surgical History:  Procedure Laterality Date   shoulder Right    TONSILLECTOMY AND ADENOIDECTOMY      Family History  Problem Relation Age of Onset   Hypertension Mother    Transient ischemic attack Mother    Diabetes Brother        Type 1   Breast cancer Maternal Aunt    Cancer Maternal Aunt     Social History   Tobacco Use   Smoking status: Never Smoker   Smokeless tobacco: Never Used  Substance Use Topics   Alcohol use: Yes    Alcohol/week: 1.0 standard drinks    Types: 1 Glasses of wine per week    Frequency: Never   Drug use: Never    Prior to Admission medications   Medication Sig Start Date End Date Taking? Authorizing Provider  amitriptyline (ELAVIL) 25 MG tablet 1-2 tablets at bedtime 02/25/19   [provider]  Erenumab-aooe (AIMOVIG) 140 MG/ML SOAJ Inject 140 mg into the skin every 30 (thirty) days. 12/11/18 12/12/19  [provider]  ferrous sulfate 325 (65 FE) MG tablet Take 325 mg by mouth 3 (three) times daily. 12/04/14   [provider]  HYDROcodone-acetaminophen (NORCO/VICODIN) 5-325 MG tablet Take 1 tablets every 6 hours as needed for severe pain 05/18/19   Renne CriglerGeiple, Joshua, PA-C  methocarbamol (ROBAXIN) 750 MG tablet Take 750 mg by mouth 3 (three) times daily as needed (for headache).  11/25/18 11/26/19  [provider]  metoprolol succinate (TOPROL-XL) 25 MG 24 hr tablet Take 25 mg by mouth daily. 08/26/18   [provider]  naproxen (NAPROSYN) 500 MG tablet Take 1 tablet (500 mg total) by mouth 2 (two) times daily. 05/18/19   Renne CriglerGeiple, Joshua, PA-C  ondansetron (ZOFRAN) 8 MG tablet Take 8 mg by mouth every 8 (eight) hours as needed for nausea or vomiting.  11/25/18 11/25/19  [provider]  propranolol (INDERAL) 10 MG tablet TAKE 1 TABLET BY MOUTH DAILY AS NEEDED 04/15/19   Rollene RotundaHochrein, James, MD    Allergies Nitrous oxide, Shellfish allergy, and Reglan [metoclopramide]   REVIEW OF SYSTEMS  Negative except as noted here or in the History of Present Illness.   PHYSICAL EXAMINATION  Initial Vital Signs Blood pressure 117/72, pulse 94, temperature 98.4 F (36.9 C), temperature source Oral, resp. rate 18, height 5\' 7"  (1.702 m), weight 68 kg, last menstrual period 05/07/2019, SpO2 99 %.  Examination General: Well-developed, well-nourished female in no acute distress; appearance consistent with  age of record HENT: normocephalic; atraumatic Eyes: Normal appearance Neck: supple Heart: regular rate and rhythm Lungs: clear to auscultation bilaterally Abdomen: soft; nondistended; left lower quadrant tenderness bowel sounds present Extremities: No deformity; full range of motion; pulses normal Neurologic: Awake, alert and oriented; motor function intact in all extremities and symmetric; no facial droop Skin: Warm and dry Psychiatric: Grimacing   RESULTS  Summary of this visit's results, reviewed by myself:   EKG Interpretation  Date/Time:    Ventricular Rate:    PR Interval:    QRS  Duration:   QT Interval:    QTC Calculation:   R Axis:     Text Interpretation:        Laboratory Studies: No results found for this or any previous visit (from the past 24 hour(s)). Imaging Studies: Ct Abdomen Pelvis W Contrast  Result Date: 05/18/2019 CLINICAL DATA:  Left lower quadrant pain EXAM: CT ABDOMEN AND PELVIS WITH CONTRAST TECHNIQUE: Multidetector CT imaging of the abdomen and pelvis was performed using the standard protocol following bolus administration of intravenous contrast. CONTRAST:  11mL OMNIPAQUE IOHEXOL 300 MG/ML  SOLN COMPARISON:  None. FINDINGS: Lower chest: No acute abnormality. Epicardial fat pad is noted on the right Hepatobiliary: No focal liver abnormality is seen. No gallstones, gallbladder wall thickening, or biliary dilatation. Pancreas: Unremarkable. No pancreatic ductal dilatation or surrounding inflammatory changes. Spleen: Normal in size without focal abnormality. Adrenals/Urinary Tract: Adrenal glands are within normal limits. Kidneys are well visualized bilaterally without renal calculi or obstructive changes. The bladder is decompressed. Stomach/Bowel: No obstructive or inflammatory changes of the colon are seen. The appendix is within normal limits. No small bowel abnormality is noted. Stomach is unremarkable. Vascular/Lymphatic: No significant vascular findings are present. No enlarged abdominal or pelvic lymph nodes. Circumaortic left renal vein is noted. Reproductive: Uterus is within normal limits. Fluid is noted in the endometrial canal likely related to patient's menstrual status. There is a 2 cm cyst in the left ovary. Other: No abdominal wall hernia or abnormality. No abdominopelvic ascites. Musculoskeletal: No acute or significant osseous findings. IMPRESSION: 2 cm left ovarian cyst without complicating factors. No other focal abnormality is noted. Electronically Signed   By: Inez Catalina M.D.   On: 05/18/2019 20:33    ED COURSE and MDM  Nursing  notes and initial vitals signs, including pulse oximetry, reviewed.  Vitals:   05/19/19 2321 05/20/19 0104  BP: 117/72   Pulse: 94   Resp: 18   Temp: 98.4 F (36.9 C)   TempSrc: Oral   SpO2: 99%   Weight:  68 kg  Height:  5\' 7"  (1.702 m)   The patient's acute exacerbation of pain is concerning for torsion or rupture.  [facility], accepted by Dr. Dayna Barker, for emergent ultrasound.  PROCEDURES    ED DIAGNOSES     ICD-10-CM   1. Left lower quadrant abdominal pain  R10.32        Shanon Rosser, MD 05/20/19 3734

## 2019-05-20 NOTE — Discharge Instructions (Addendum)
Can use vicodin for pain.  Do not take if planning to drive, etc. Follow-up with your OB-GYN. Return here for new concerns.

## 2019-05-21 ENCOUNTER — Telehealth: Payer: Self-pay

## 2019-05-21 NOTE — Telephone Encounter (Signed)
Pt called the office stating she is still having pelvic pain. Pt states that  hydrocodone and Dilaudid has not provided any relief. Pt is schedule for an appt on 05/23/19.  Michelle Vincent l Yamaira Spinner, CMA

## 2019-05-23 ENCOUNTER — Encounter: Payer: Self-pay | Admitting: Obstetrics & Gynecology

## 2019-05-23 ENCOUNTER — Ambulatory Visit (INDEPENDENT_AMBULATORY_CARE_PROVIDER_SITE_OTHER): Payer: 59 | Admitting: Obstetrics & Gynecology

## 2019-05-23 DIAGNOSIS — N83202 Unspecified ovarian cyst, left side: Secondary | ICD-10-CM

## 2019-05-23 MED ORDER — DICLOFENAC POTASSIUM 50 MG PO TABS: 50.0000 mg | ORAL_TABLET | Freq: Three times a day (TID) | ORAL | 1 refills | Status: DC

## 2019-05-23 NOTE — Progress Notes (Signed)
TELEHEALTH GYNECOLOGY VIRTUAL VIDEO VISIT ENCOUNTER NOTE  [facility]   I connected with Michelle Vincent on 05/23/19 at  9:00 AM EDT by WebEx Video Encounter at home and verified that I am speaking with the correct person using two identifiers.   I discussed the limitations, risks, security and privacy concerns of performing an evaluation and management service virtually and the availability of in person appointments. I also discussed with the patient that there may be a patient responsible charge related to this service. The patient expressed understanding and agreed to proceed.   History:  Michelle Heckabatha Schwenke is a 28 y.o. G0P0000 female LMP 05/06/2019 being evaluated today for f/u of abd pain that started 6 days prev that became worse. She denies any abnormal vaginal discharge, bleeding or other concerns.  She is taking hydrocodone with min relief of pain.      Past Medical History:  Diagnosis Date   HA (headache)    Occipital neuralgia    Sinus tachycardia    SVT (supraventricular tachycardia) (HCC)    Past Surgical History:  Procedure Laterality Date   shoulder Right    TONSILLECTOMY AND ADENOIDECTOMY     The following portions of the patient's history were reviewed and updated as appropriate: allergies, current medications, past family history, past medical history, past social history, past surgical history and problem list.    Review of Systems:  Pertinent items noted in HPI and remainder of comprehensive ROS otherwise negative.  Physical Exam:   General:  Alert, oriented and cooperative. Patient appears to be in no acute distress.  Mental Status: Normal mood and affect. Normal behavior. Normal judgment and thought content.   Respiratory: Normal respiratory effort, no problems with respiration noted  Rest of physical exam deferred due to type of encounter  Labs and Imaging Results for orders  placed or performed during the hospital encounter of 05/18/19 (from the past 336 hour(s))  Urinalysis, Routine w reflex microscopic   Collection Time: 05/18/19  5:13 PM  Result Value Ref Range   Color, Urine YELLOW YELLOW   APPearance CLEAR CLEAR   Specific Gravity, Urine >1.030 (H) 1.005 - 1.030   pH 5.5 5.0 - 8.0   Glucose, UA NEGATIVE NEGATIVE mg/dL   Hgb urine dipstick NEGATIVE NEGATIVE   Bilirubin Urine NEGATIVE NEGATIVE   Ketones, ur 15 (A) NEGATIVE mg/dL   Protein, ur NEGATIVE NEGATIVE mg/dL   Nitrite NEGATIVE NEGATIVE   Leukocytes,Ua NEGATIVE NEGATIVE  Pregnancy, urine   Collection Time: 05/18/19  5:13 PM  Result Value Ref Range   Preg Test, Ur NEGATIVE NEGATIVE  Lipase, blood   Collection Time: 05/18/19  6:00 PM  Result Value Ref Range   Lipase 31 11 - 51 U/L  Comprehensive metabolic panel   Collection Time: 05/18/19  6:00 PM  Result Value Ref Range   Sodium 136 135 - 145 mmol/L   Potassium 3.9 3.5 - 5.1 mmol/L   Chloride 103 98 - 111 mmol/L   CO2 23 22 - 32 mmol/L   Glucose, Bld 96 70 - 99 mg/dL   BUN 12 6 - 20 mg/dL   Creatinine, Ser 1.610.75 0.44 - 1.00 mg/dL   Calcium 9.4 8.9 - 09.610.3 mg/dL   Total Protein 7.3 6.5 - 8.1 g/dL   Albumin 4.6 3.5 - 5.0 g/dL   AST 14 (L) 15 - 41 U/L   ALT 11 0 - 44 U/L   Alkaline Phosphatase 44 38 -  126 U/L   Total Bilirubin 0.7 0.3 - 1.2 mg/dL   GFR calc non Af Amer >60 >60 mL/min   GFR calc Af Amer >60 >60 mL/min   Anion gap 10 5 - 15  CBC   Collection Time: 05/18/19  6:00 PM  Result Value Ref Range   WBC 4.9 4.0 - 10.5 K/uL   RBC 4.37 3.87 - 5.11 MIL/uL   Hemoglobin 13.2 12.0 - 15.0 g/dL   HCT 38.9 36.0 - 46.0 %   MCV 89.0 80.0 - 100.0 fL   MCH 30.2 26.0 - 34.0 pg   MCHC 33.9 30.0 - 36.0 g/dL   RDW 11.8 11.5 - 15.5 %   Platelets 275 150 - 400 K/uL   nRBC 0.0 0.0 - 0.2 %   Ct Abdomen Pelvis W Contrast  Result Date: 05/18/2019 CLINICAL DATA:  Left lower quadrant pain EXAM: CT ABDOMEN AND PELVIS WITH CONTRAST TECHNIQUE:  Multidetector CT imaging of the abdomen and pelvis was performed using the standard protocol following bolus administration of intravenous contrast. CONTRAST:  150mL OMNIPAQUE IOHEXOL 300 MG/ML  SOLN COMPARISON:  None. FINDINGS: Lower chest: No acute abnormality. Epicardial fat pad is noted on the right Hepatobiliary: No focal liver abnormality is seen. No gallstones, gallbladder wall thickening, or biliary dilatation. Pancreas: Unremarkable. No pancreatic ductal dilatation or surrounding inflammatory changes. Spleen: Normal in size without focal abnormality. Adrenals/Urinary Tract: Adrenal glands are within normal limits. Kidneys are well visualized bilaterally without renal calculi or obstructive changes. The bladder is decompressed. Stomach/Bowel: No obstructive or inflammatory changes of the colon are seen. The appendix is within normal limits. No small bowel abnormality is noted. Stomach is unremarkable. Vascular/Lymphatic: No significant vascular findings are present. No enlarged abdominal or pelvic lymph nodes. Circumaortic left renal vein is noted. Reproductive: Uterus is within normal limits. Fluid is noted in the endometrial canal likely related to patient's menstrual status. There is a 2 cm cyst in the left ovary. Other: No abdominal wall hernia or abnormality. No abdominopelvic ascites. Musculoskeletal: No acute or significant osseous findings. IMPRESSION: 2 cm left ovarian cyst without complicating factors. No other focal abnormality is noted. Electronically Signed   By: Inez Catalina M.D.   On: 05/18/2019 20:33   [place] Pelvic Doppler (torsion R/o Or Mass Arterial Flow)  Result Date: 05/20/2019 CLINICAL DATA:  Pelvic pain EXAM: TRANSABDOMINAL AND TRANSVAGINAL ULTRASOUND OF PELVIS DOPPLER ULTRASOUND OF OVARIES TECHNIQUE: Both transabdominal and transvaginal ultrasound examinations of the pelvis were performed. Transabdominal technique was performed for global imaging of the pelvis including uterus,  ovaries, adnexal regions, and pelvic cul-de-sac. It was necessary to proceed with endovaginal exam following the transabdominal exam to visualize the endometrium and ovaries. Color and duplex Doppler ultrasound was utilized to evaluate blood flow to the ovaries. COMPARISON:  Abdomen and pelvis CT from 2 days ago FINDINGS: Uterus Measurements: 9 x 3 x 5 cm = volume: Approximately 80 mL. No fibroids or other mass visualized. Nabothian cysts Endometrium Thickness: 15 mm.  No focal abnormality visualized. Right ovary Measurements: 31 x 14 x 20 mm = volume: 4.6 mL. Normal appearance/no adnexal mass. Left ovary Measurements: 37 x 27 x 34 mm = volume: 17 mL. Dominant follicle measuring up to 2.4 cm. Pulsed Doppler evaluation of both ovaries demonstrates normal low-resistance arterial and venous waveforms. Other findings No abnormal free fluid. IMPRESSION: No acute finding.  Normal ovarian blood flow. Electronically Signed   By: Monte Fantasia M.D.   On: 05/20/2019 05:18   US Pelvic  Complete With Transvaginal  Result Date: 05/20/2019 CLINICAL DATA:  Pelvic pain EXAM: TRANSABDOMINAL AND TRANSVAGINAL ULTRASOUND OF PELVIS DOPPLER ULTRASOUND OF OVARIES TECHNIQUE: Both transabdominal and transvaginal ultrasound examinations of the pelvis were performed. Transabdominal technique was performed for global imaging of the pelvis including uterus, ovaries, adnexal regions, and pelvic cul-de-sac. It was necessary to proceed with endovaginal exam following the transabdominal exam to visualize the endometrium and ovaries. Color and duplex Doppler ultrasound was utilized to evaluate blood flow to the ovaries. COMPARISON:  Abdomen and pelvis CT from 2 days ago FINDINGS: Uterus Measurements: 9 x 3 x 5 cm = volume: Approximately 80 mL. No fibroids or other mass visualized. Nabothian cysts Endometrium Thickness: 15 mm.  No focal abnormality visualized. Right ovary Measurements: 31 x 14 x 20 mm = volume: 4.6 mL. Normal appearance/no  adnexal mass. Left ovary Measurements: 37 x 27 x 34 mm = volume: 17 mL. Dominant follicle measuring up to 2.4 cm. Pulsed Doppler evaluation of both ovaries demonstrates normal low-resistance arterial and venous waveforms. Other findings No abnormal free fluid. IMPRESSION: No acute finding.  Normal ovarian blood flow. Electronically Signed   By: Marnee SpringJonathon  Watts M.D.   On: 05/20/2019 05:18       Assessment and Plan:     Left ovarian dominant follicle  NSAIDS prn  F/u in 3-5 weeks        I discussed the assessment and treatment plan with the patient. The patient was provided an opportunity to ask questions and all were answered. The patient agreed with the plan and demonstrated an understanding of the instructions.   The patient was advised to call back or seek an in-person evaluation/go to the ED if the symptoms worsen or if the condition fails to improve as anticipated.  I provided 15 minutes of face-to-face time during this encounter.   Willodean Rosenthalarolyn Harraway-Smith, MD [facility], [facility]

## 2019-05-24 ENCOUNTER — Encounter (HOSPITAL_COMMUNITY): Payer: Self-pay

## 2019-05-24 ENCOUNTER — Other Ambulatory Visit: Payer: Self-pay

## 2019-05-24 ENCOUNTER — Emergency Department (HOSPITAL_COMMUNITY)
Admission: EM | Admit: 2019-05-24 | Discharge: 2019-05-25 | Disposition: A | Discharge status: Home / Self Care | Payer: 59 | Attending: Emergency Medicine | Admitting: Emergency Medicine

## 2019-05-24 DIAGNOSIS — N83202 Unspecified ovarian cyst, left side: Secondary | ICD-10-CM | POA: Insufficient documentation

## 2019-05-24 DIAGNOSIS — Z79899 Other long term (current) drug therapy: Secondary | ICD-10-CM | POA: Insufficient documentation

## 2019-05-24 DIAGNOSIS — R1032 Left lower quadrant pain: Secondary | ICD-10-CM | POA: Diagnosis present

## 2019-05-24 MED ORDER — ONDANSETRON HCL 4 MG/2ML IJ SOLN
4.0000 mg | Freq: Once | INTRAMUSCULAR | Status: AC
  Administered 2019-05-24: 4 mg via INTRAVENOUS
  Filled 2019-05-24: qty 2

## 2019-05-24 MED ORDER — DIPHENHYDRAMINE HCL 50 MG/ML IJ SOLN
25.0000 mg | Freq: Once | INTRAMUSCULAR | Status: AC
  Administered 2019-05-24: 20:00:00 25 mg via INTRAVENOUS
  Filled 2019-05-24: qty 1

## 2019-05-24 MED ORDER — MORPHINE SULFATE (PF) 4 MG/ML IV SOLN
4.0000 mg | Freq: Once | INTRAVENOUS | Status: AC
  Administered 2019-05-24: 4 mg via INTRAVENOUS
  Filled 2019-05-24: qty 1

## 2019-05-24 MED ORDER — KETOROLAC TROMETHAMINE 30 MG/ML IJ SOLN
30.0000 mg | Freq: Once | INTRAMUSCULAR | Status: AC
  Administered 2019-05-24: 30 mg via INTRAVENOUS
  Filled 2019-05-24: qty 1

## 2019-05-24 MED ORDER — KETAMINE HCL 50 MG/5ML IJ SOSY
0.3000 mg/kg | PREFILLED_SYRINGE | Freq: Once | INTRAMUSCULAR | Status: AC
  Administered 2019-05-24: 20 mg via INTRAVENOUS
  Filled 2019-05-24: qty 5

## 2019-05-24 NOTE — ED Provider Notes (Signed)
[facility]-EMERGENCY DEPT Provider Note   CSN: [id number] Arrival date & time: 05/24/19  1812    History   Chief Complaint Chief Complaint  Patient presents with  . Abdominal Pain    HPI Michelle Vincent is a 28 y.o. female.     Patient is a 28 year old female who presents with about a one-week history of pain in her left lower abdomen.  She was seen in the emergency department on July 12 and had a CT scan which showed a left ovarian cyst.  She returned 2 days later with worsening pain and had a pelvic ultrasound which was unremarkable.  There is normal Doppler studies.  She was taking hydrocodone for pain which she says was not super helpful.  She had a telehealth visit with her OB/GYN yesterday and was encouraged to continue NSAIDs.  They wanted to wait about 3 weeks before they would intervene on the cyst.  She is continuing to have pain in her left lower abdomen.  She states is the same pain that she has been having since she had a CT scan and ultrasound.  It has not changed.  She has a history of ovarian cyst in the past but they typically have not been this bad.  She denies any fevers.  She is had some nausea but no vomiting.  No urinary symptoms.  No vaginal bleeding or discharge.     Past Medical History:  Diagnosis Date  . HA (headache)   . Occipital neuralgia   . Sinus tachycardia   . SVT (supraventricular tachycardia) [facility](HCC)     Patient Active Problem List   Diagnosis Date Noted  . Tachycardia 03/03/2019    Past Surgical History:  Procedure Laterality Date  . shoulder Right   . TONSILLECTOMY AND ADENOIDECTOMY       OB History    Gravida  0   Para  0   Term  0   Preterm  0   AB  0   Living  0     SAB  0   TAB  0   Ectopic  0   Multiple  0   Live Births  0            Home Medications    Prior to Admission medications   Medication Sig Start Date End Date Taking? Authorizing Provider  diclofenac (CATAFLAM) 50  MG tablet Take 1 tablet (50 mg total) by mouth 3 (three) times daily. 05/23/19  Yes Harraway-Smith, Eber Jonesarolyn, MD  Erenumab-aooe (AIMOVIG) 140 MG/ML SOAJ Inject 140 mg into the skin every 30 (thirty) days. 12/11/18 12/12/19 Yes [provider]  ferrous sulfate 325 (65 FE) MG tablet Take 325 mg by mouth 3 (three) times daily. 12/04/14  Yes [provider]  methocarbamol (ROBAXIN) 750 MG tablet Take 750 mg by mouth 3 (three) times daily as needed (for headache).  11/25/18 11/26/19 Yes [provider]  ondansetron (ZOFRAN) 8 MG tablet Take 8 mg by mouth every 8 (eight) hours as needed for nausea or vomiting.  11/25/18 11/25/19 Yes [provider]  propranolol (INDERAL) 10 MG tablet TAKE 1 TABLET BY MOUTH DAILY AS NEEDED Patient taking differently: Take 10 mg by mouth daily as needed (svt).  04/15/19  Yes Rollene RotundaHochrein, James, MD  zolpidem (AMBIEN) 10 MG tablet Take 10 mg by mouth at bedtime. 04/30/19  Yes [provider]  HYDROcodone-acetaminophen (NORCO/VICODIN) 5-325 MG tablet Take 1 tablets every 6 hours as needed for severe pain Patient  not taking: Reported on 05/23/2019 05/18/19   Renne CriglerGeiple, Joshua, PA-C  HYDROcodone-acetaminophen (NORCO/VICODIN) 5-325 MG tablet Take 1 tablet by mouth every 4 (four) hours as needed. Patient not taking: Reported on 05/23/2019 05/20/19   Garlon HatchetSanders, Lisa M, PA-C    Family History Family History  Problem Relation Age of Onset  . Hypertension Mother   . Transient ischemic attack Mother   . Diabetes Brother        Type 1  . Breast cancer Maternal Aunt   . Cancer Maternal Aunt     Social History Social History   Tobacco Use  . Smoking status: Never Smoker  . Smokeless tobacco: Never Used  Substance Use Topics  . Alcohol use: Yes    Alcohol/week: 1.0 standard drinks    Types: 1 Glasses of wine per week    Frequency: Never  . Drug use: Never     Allergies   Nitrous oxide, Peach flavor, Shellfish allergy, Adhesive [tape], and Reglan  [metoclopramide]   Review of Systems Review of Systems  Constitutional: Negative for chills, diaphoresis, fatigue and fever.  HENT: Negative for congestion, rhinorrhea and sneezing.   Eyes: Negative.   Respiratory: Negative for cough, chest tightness and shortness of breath.   Cardiovascular: Negative for chest pain and leg swelling.  Gastrointestinal: Positive for abdominal pain (Left lower abdominal pain). Negative for blood in stool, diarrhea, nausea and vomiting.  Genitourinary: Positive for pelvic pain. Negative for difficulty urinating, flank pain, frequency and hematuria.  Musculoskeletal: Negative for arthralgias and back pain.  Skin: Negative for rash.  Neurological: Negative for dizziness, speech difficulty, weakness, numbness and headaches.     Physical Exam Updated Vital Signs BP 111/76   Pulse 75   Temp 97.8 F (36.6 C) (Oral)   Resp 19   LMP 05/07/2019   SpO2 95%   Physical Exam Constitutional:      Appearance: She is well-developed.  HENT:     Head: Normocephalic and atraumatic.  Eyes:     Pupils: Pupils are equal, round, and reactive to light.  Neck:     Musculoskeletal: Normal range of motion and neck supple.  Cardiovascular:     Rate and Rhythm: Normal rate and regular rhythm.     Heart sounds: Normal heart sounds.  Pulmonary:     Effort: Pulmonary effort is normal. No respiratory distress.     Breath sounds: Normal breath sounds. No wheezing or rales.  Chest:     Chest wall: No tenderness.  Abdominal:     General: Bowel sounds are normal.     Palpations: Abdomen is soft.     Tenderness: There is abdominal tenderness in the left lower quadrant. There is no guarding or rebound.  Musculoskeletal: Normal range of motion.  Lymphadenopathy:     Cervical: No cervical adenopathy.  Skin:    General: Skin is warm and dry.     Findings: No rash.  Neurological:     Mental Status: She is alert and oriented to person, place, and time.      ED  Treatments / Results  Labs (all labs ordered are listed, but only abnormal results are displayed) Labs Reviewed - No data to display  EKG None  Radiology No results found.  Procedures Procedures (including critical care time)  Medications Ordered in ED Medications  ketamine 50 mg in normal saline 5 mL (10 mg/mL) syringe (has no administration in time range)  ketorolac (TORADOL) 30 MG/ML injection 30 mg (30 mg Intravenous Given 05/24/19  1902)  morphine 4 MG/ML injection 4 mg (4 mg Intravenous Given 05/24/19 2010)  ondansetron (ZOFRAN) injection 4 mg (4 mg Intravenous Given 05/24/19 2010)  diphenhydrAMINE (BENADRYL) injection 25 mg (25 mg Intravenous Given 05/24/19 2017)     Initial Impression / Assessment and Plan / ED Course  I have reviewed the triage vital signs and the nursing notes.  Pertinent labs & imaging results that were available during my care of the patient were reviewed by me and considered in my medical decision making (see chart for details).        Patient is a 29 year old female who presents with pain in her left lower abdomen.  She has had recent imaging studies that demonstrated left ovarian cyst.  She has had a recent Doppler ultrasound which showed no evidence of torsion.  Her symptoms have not really changed so I did not feel the repeat imaging was indicated.  She was given Toradol and dose of morphine which has not really improved her symptoms.  I will try dose of ketamine.  Hannah Muthersbaugh, PA-C to reassess after ketamine and discharge as appropriate.  Final Clinical Impressions(s) / ED Diagnoses   Final diagnoses:  Cyst of left ovary    ED Discharge Orders    None       Malvin Johns, MD 05/24/19 2206

## 2019-05-24 NOTE — ED Triage Notes (Signed)
EMS reports from home, Pt c/o increasing abdominal pain x a week. Dx at [facility] week ago with ovarian cysts. Pt states pain meds ineffective.  BP 122/81 HR 100 RR 20 Sp02 99 RA CBG 92  20ga LAC  132mcgm Fentanyl 4 mg Zofran enroute

## 2019-05-25 MED ORDER — OXYCODONE HCL 5 MG PO TABS: 5.0000 mg | ORAL_TABLET | Freq: Four times a day (QID) | ORAL | 0 refills | Status: DC | PRN

## 2019-05-25 NOTE — Discharge Instructions (Addendum)
1. Medications: Roxicodone, usual home medications 2. Treatment: rest, drink plenty of fluids, advance diet slowly 3. Follow Up: Please followup with your primary doctor in 2 days for discussion of your diagnoses and further evaluation after today's visit; if you do not have a primary care doctor use the resource guide provided to find one; Please return to the ER for persistent vomiting, high fevers or worsening symptoms

## 2019-05-25 NOTE — ED Provider Notes (Signed)
Care assumed from Dr. Tamera Punt.  Please see her full H&P.  In short,  Michelle Vincent is a 28 y.o. female presents for continued pain from ovarian cyst.  Pt reports pain is unchanged, but not improved.  Recent diagnosis of ovarian cyst via CT scan and no evidence of ovarian torsion on [place].  NO change in pain with Toradol or morphine.  Ketamine ordered.    Physical Exam  BP 116/79   Pulse 65   Temp 97.8 F (36.6 C) (Oral)   Resp 15   LMP 05/07/2019   SpO2 100%   Physical Exam Vitals signs and nursing note reviewed.  Constitutional:      General: She is not in acute distress.    Appearance: She is well-developed.  HENT:     Head: Normocephalic.  Eyes:     General: No scleral icterus.    Conjunctiva/sclera: Conjunctivae normal.  Neck:     Musculoskeletal: Normal range of motion.  Cardiovascular:     Rate and Rhythm: Normal rate.  Pulmonary:     Effort: Pulmonary effort is normal.  Musculoskeletal: Normal range of motion.  Skin:    General: Skin is warm and dry.  Neurological:     Mental Status: She is alert.     ED Course/Procedures   Clinical Course as of May 24 25  Sat May 24, 2019  2230 Plan: Pain control with Ketamine.  If pain improved, may be d/c home.   [HM]  Sun May 25, 2019  0018 Pt reports pain is resolved after Ketamine.  SHe wishes for d/c home.   [HM]  0020 [place] Narcotic database reviewed.  Pt has been given hydrocodone Rx on 7/12 and 7/14 - each for 3 days.  Will give very small Rx of oxycodone as she reports hydrocodone is not working.  All additional pain control will need to come from OB/GYN.   [HM]    Clinical Course User Index [HM] Yalda Herd, Gwenlyn Perking    Procedures  MDM    Pt with pelvic pain.  Denies vaginal symptoms.  Pain relieved with Ketamine.  Pt requests d/c home.  Discussed return precautions and need for close f/u with OB/GYN.  Rx given.  No further narcotics will be given in ED - pt will PCP or OB/GYN will need to continue  pain management.    ICD-10-CM   1. Cyst of left ovary  N83.202          Osvaldo Lamping, Gwenlyn Perking 31/54/00 8676    Delora Fuel, MD 19/50/93 [phone]

## 2019-06-27 ENCOUNTER — Ambulatory Visit: Payer: 59 | Admitting: Family Medicine

## 2019-07-03 ENCOUNTER — Encounter: Payer: Self-pay | Admitting: Family Medicine

## 2019-07-03 ENCOUNTER — Ambulatory Visit: Payer: 59 | Admitting: Family Medicine

## 2019-07-03 ENCOUNTER — Other Ambulatory Visit: Payer: Self-pay

## 2019-07-03 ENCOUNTER — Ambulatory Visit (INDEPENDENT_AMBULATORY_CARE_PROVIDER_SITE_OTHER): Payer: 59 | Admitting: Family Medicine

## 2019-07-03 VITALS — BP 105/63 | HR 75 | Ht 66.0 in | Wt 155.0 lb

## 2019-07-03 DIAGNOSIS — R6882 Decreased libido: Secondary | ICD-10-CM

## 2019-07-03 DIAGNOSIS — N83202 Unspecified ovarian cyst, left side: Secondary | ICD-10-CM | POA: Diagnosis not present
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
## 2019-07-24 NOTE — Progress Notes (Signed)
Cardiology Office Note   Date:  07/25/2019   ID:  Michelle Vincent, DOB February 05, 1991, MRN [id number]  PCP:  Ardell IsaacsHill, Stacy, NP  Cardiologist:   Rollene RotundaJames Tawna Alwin, MD   Chief Complaint  Patient presents with  . Palpitations      History of Present Illness: Michelle Heckabatha Brauner is a 28 y.o. female with palpitations as described in a previous note.  She wore her monitor and she did describe lots of symptoms of palpitations and weakness and chest discomfort.  Her average heart rate was about 98.  Her lowest heart rate was 60 which was about 1:30 in the afternoon.  Her most rapid heart rate was 185  evening. I was trying to get orthostatic BPs from her.    She returns for in person follow-up.  She still has some episodes of dizziness but does better on the propranolol.  She occasionally has heart rates in the 120s 130s.  Her blood pressure is low.  She is [redacted] weeks pregnant with her first child.  She is not had any frank syncope.  She is not had any new chest pressure, neck or arm discomfort.  She is working for EMS.  She has had some morning sickness.    Past Medical History:  Diagnosis Date  . HA (headache)   . Occipital neuralgia   . Sinus tachycardia   . SVT (supraventricular tachycardia) (HCC)     Past Surgical History:  Procedure Laterality Date  . shoulder Right   . TONSILLECTOMY AND ADENOIDECTOMY       Current Outpatient Medications  Medication Sig Dispense Refill  . diclofenac (CATAFLAM) 50 MG tablet Take 1 tablet (50 mg total) by mouth 3 (three) times daily. 40 tablet 1  . Erenumab-aooe (AIMOVIG) 140 MG/ML SOAJ Inject 140 mg into the skin every 30 (thirty) days.    . ferrous sulfate 325 (65 FE) MG tablet Take 325 mg by mouth 3 (three) times daily.    . methocarbamol (ROBAXIN) 750 MG tablet Take 750 mg by mouth 3 (three) times daily as needed (for headache).     . ondansetron (ZOFRAN) 8 MG tablet Take 8 mg by mouth every 8 (eight) hours as needed for nausea or vomiting.      . Prenatal Vit-Fe Fumarate-FA (PRENATAL MULTIVITAMIN) TABS tablet Take 1 tablet by mouth daily at 12 noon.    . promethazine (PHENERGAN) 25 MG tablet Take 1/2-1 tablet three times daily as needed for headache or nausea    . propranolol (INDERAL) 10 MG tablet TAKE 1 TABLET BY MOUTH DAILY AS NEEDED (Patient taking differently: Take 10 mg by mouth daily as needed (svt). ) 30 tablet 10  . zolpidem (AMBIEN) 10 MG tablet Take 10 mg by mouth at bedtime.     No current facility-administered medications for this visit.     Allergies:   Nitrous oxide, Peach flavor, Shellfish allergy, Adhesive [tape], and Reglan [metoclopramide]    ROS:  Please see the history of present illness.   Otherwise, review of systems are positive for none.   All other systems are reviewed and negative.    PHYSICAL EXAM: VS:  BP 102/74 (Patient Position: Standing)   Pulse 62   Temp (!) 97 F (36.1 C) (Temporal)   Ht 5\' 7"  (1.702 m)   Wt 156 lb (70.8 kg)   SpO2 99%   BMI 24.43 kg/m  , BMI Body mass index is 24.43 kg/m. GENERAL:  Well appearing HEENT:  Pupils equal round and reactive,  fundi not visualized, oral mucosa unremarkable NECK:  No jugular venous distention, waveform within normal limits, carotid upstroke brisk and symmetric, no bruits, no thyromegaly LYMPHATICS:  No cervical, inguinal adenopathy LUNGS:  Clear to auscultation bilaterally BACK:  No CVA tenderness CHEST:  Unremarkable HEART:  PMI not displaced or sustained,S1 and S2 within normal limits, no S3, no S4, no clicks, no rubs, no murmurs ABD:  Flat, positive bowel sounds normal in frequency in pitch, no bruits, no rebound, no guarding, no midline pulsatile mass, no hepatomegaly, no splenomegaly EXT:  2 plus pulses throughout, no edema, no cyanosis no clubbing SKIN:  No rashes no nodules NEURO:  Cranial nerves II through XII grossly intact, motor grossly intact throughout PSYCH:  Cognitively intact, oriented to person place and time    EKG:   EKG is not ordered today.   Recent Labs: 05/18/2019: ALT 11; BUN 12; Creatinine, Ser 0.75; Hemoglobin 13.2; Platelets 275; Potassium 3.9; Sodium 136    Lipid Panel No results found for: CHOL, TRIG, HDL, CHOLHDL, VLDL, LDLCALC, LDLDIRECT    Wt Readings from Last 3 Encounters:  07/25/19 156 lb (70.8 kg)  07/03/19 155 lb (70.3 kg)  05/20/19 150 lb (68 kg)      Other studies Reviewed: Additional studies/ records that were reviewed today include: None. Review of the above records demonstrates:  Please see elsewhere in the note.     ASSESSMENT AND PLAN:  PALPITATIONS:   At this point given her pregnancy will get a very minimal on her therapy although propranolol is acceptable during pregnancy.  She was instructed on to use it PRN.  She can stay hydrated.  I have given her some compression stockings information given her lower blood pressure.  Orthostatics were checked in the office today.     Current medicines are reviewed at length with the patient today.  The patient does not have concerns regarding medicines.  The following changes have been made:  no change  Labs/ tests ordered today include: None No orders of the defined types were placed in this encounter.    Disposition:   FU with APP in six months.  Can be virtual visit.     Signed, Minus Breeding, MD  07/25/2019 8:44 AM    [facility]

## 2019-07-25 ENCOUNTER — Encounter (HOSPITAL_COMMUNITY): Payer: Self-pay

## 2019-07-25 ENCOUNTER — Ambulatory Visit: Payer: 59 | Admitting: Cardiology

## 2019-07-25 ENCOUNTER — Inpatient Hospital Stay (HOSPITAL_COMMUNITY)
Admission: AD | Admit: 2019-07-25 | Discharge: 2019-07-25 | Disposition: A | Discharge status: Home / Self Care | Payer: 59 | Attending: Obstetrics & Gynecology | Admitting: Obstetrics & Gynecology

## 2019-07-25 ENCOUNTER — Encounter: Payer: Self-pay | Admitting: Cardiology

## 2019-07-25 ENCOUNTER — Inpatient Hospital Stay (HOSPITAL_COMMUNITY): Payer: 59

## 2019-07-25 ENCOUNTER — Other Ambulatory Visit: Payer: Self-pay

## 2019-07-25 VITALS — BP 102/74 | HR 62 | Temp 97.0°F | Ht 67.0 in | Wt 156.0 lb

## 2019-07-25 DIAGNOSIS — R002 Palpitations: Secondary | ICD-10-CM | POA: Diagnosis not present

## 2019-07-25 DIAGNOSIS — Z803 Family history of malignant neoplasm of breast: Secondary | ICD-10-CM | POA: Diagnosis not present

## 2019-07-25 DIAGNOSIS — O26891 Other specified pregnancy related conditions, first trimester: Secondary | ICD-10-CM | POA: Diagnosis present

## 2019-07-25 DIAGNOSIS — Z8249 Family history of ischemic heart disease and other diseases of the circulatory system: Secondary | ICD-10-CM | POA: Diagnosis not present

## 2019-07-25 DIAGNOSIS — R109 Unspecified abdominal pain: Secondary | ICD-10-CM | POA: Insufficient documentation

## 2019-07-25 DIAGNOSIS — O4691 Antepartum hemorrhage, unspecified, first trimester: Secondary | ICD-10-CM

## 2019-07-25 DIAGNOSIS — Z3A01 Less than 8 weeks gestation of pregnancy: Secondary | ICD-10-CM | POA: Insufficient documentation

## 2019-07-25 DIAGNOSIS — O219 Vomiting of pregnancy, unspecified: Secondary | ICD-10-CM | POA: Insufficient documentation

## 2019-07-25 DIAGNOSIS — Z3491 Encounter for supervision of normal pregnancy, unspecified, first trimester: Secondary | ICD-10-CM

## 2019-07-25 DIAGNOSIS — O209 Hemorrhage in early pregnancy, unspecified: Secondary | ICD-10-CM | POA: Diagnosis not present

## 2019-07-25 DIAGNOSIS — Z79899 Other long term (current) drug therapy: Secondary | ICD-10-CM | POA: Diagnosis not present

## 2019-07-25 DIAGNOSIS — O4690 Antepartum hemorrhage, unspecified, unspecified trimester: Secondary | ICD-10-CM

## 2019-07-25 LAB — CBC
HCT: 35.7 % — ABNORMAL LOW (ref 36.0–46.0)
Hemoglobin: 12.8 g/dL (ref 12.0–15.0)
MCH: 32 pg (ref 26.0–34.0)
MCHC: 35.9 g/dL (ref 30.0–36.0)
MCV: 89.3 fL (ref 80.0–100.0)
Platelets: 246 10*3/uL (ref 150–400)
RBC: 4 MIL/uL (ref 3.87–5.11)
RDW: 11.6 % (ref 11.5–15.5)
WBC: 7.9 10*3/uL (ref 4.0–10.5)
nRBC: 0 % (ref 0.0–0.2)

## 2019-07-25 LAB — URINALYSIS, ROUTINE W REFLEX MICROSCOPIC
Bacteria, UA: NONE SEEN
Bilirubin Urine: NEGATIVE
Glucose, UA: NEGATIVE mg/dL
Ketones, ur: NEGATIVE mg/dL
Leukocytes,Ua: NEGATIVE
Nitrite: NEGATIVE
Protein, ur: NEGATIVE mg/dL
Specific Gravity, Urine: 1.002 — ABNORMAL LOW (ref 1.005–1.030)
pH: 7 (ref 5.0–8.0)

## 2019-07-25 LAB — WET PREP, GENITAL
Clue Cells Wet Prep HPF POC: NONE SEEN
Sperm: NONE SEEN
Trich, Wet Prep: NONE SEEN
Yeast Wet Prep HPF POC: NONE SEEN

## 2019-07-25 LAB — HCG, QUANTITATIVE, PREGNANCY: hCG, Beta Chain, Quant, S: 128217 m[IU]/mL — ABNORMAL HIGH (ref <5)

## 2019-07-25 LAB — POCT PREGNANCY, URINE: Preg Test, Ur: POSITIVE — AB

## 2019-07-25 MED ORDER — PROCHLORPERAZINE MALEATE 10 MG PO TABS: 10.0000 mg | ORAL_TABLET | Freq: Two times a day (BID) | ORAL | 0 refills | Status: DC | PRN

## 2019-07-25 NOTE — Patient Instructions (Signed)
Medication Instructions:  Your physician recommends that you continue on your current medications as directed. Please refer to the Current Medication list given to you today.  If you need a refill on your cardiac medications before your next appointment, please call your pharmacy.   Lab work: NONE  Testing/Procedures: NONE  Follow-Up: At [facility], you and your health needs are our priority.  As part of our continuing mission to provide you with exceptional heart care, we have created designated Provider Care Teams.  These Care Teams include your primary Cardiologist (physician) and Advanced Practice Providers (APPs -  Physician Assistants and Nurse Practitioners) who all work together to provide you with the care you need, when you need it. You will need a follow up appointment in 12 months.  Please call our office 2 months in advance to schedule this appointment.  You may see Minus Breeding, MD or one of the following Advanced Practice Providers on your designated Care Team:   Rosaria Ferries, PA-C Jory Sims, DNP, ANP  Any Other Special Instructions Will Be Listed Below (If Applicable). Come back in 6 months to see Jory Sims, DNP or Rosaria Ferries PA-C.

## 2019-07-25 NOTE — MAU Provider Note (Signed)
History     CSN: [id number]  Arrival date and time: 07/25/19 2055   First Provider Initiated Contact with Patient 07/25/19 2149      Chief Complaint  Patient presents with  . Vaginal Bleeding  . Abdominal Pain   Michelle Vincent is a 28 y.o. G1P0 at [redacted]w[redacted]d by LMP who presents to MAU with complaints of abdominal pain and vaginal bleeding. She reports abdominal pain has been occurring for the past week, describes as lower abdominal cramping that is more specific to left lower quadrant. Rates pain 4/10- has not taken any medication for abdominal pain. She reports vaginal bleeding just started occurring around 2100 this evening, describes as dark red to brown bleeding. Reports wearing a panty liner for vaginal bleeding. She reports nausea throughout pregnancy, has zofran and phenergan for nausea without relief.  Has initial prenatal appointment scheduled for [facility] in October.   OB History    Gravida  1   Para  0   Term  0   Preterm  0   AB  0   Living  0     SAB  0   TAB  0   Ectopic  0   Multiple  0   Live Births  0           Past Medical History:  Diagnosis Date  . HA (headache)   . Occipital neuralgia   . Sinus tachycardia   . SVT (supraventricular tachycardia) (HCC)     Past Surgical History:  Procedure Laterality Date  . shoulder Right   . TONSILLECTOMY AND ADENOIDECTOMY      Family History  Problem Relation Age of Onset  . Hypertension Mother   . Transient ischemic attack Mother   . Diabetes Brother        Type 1  . Breast cancer Maternal Aunt   . Cancer Maternal Aunt     Social History   Tobacco Use  . Smoking status: Never Smoker  . Smokeless tobacco: Never Used  Substance Use Topics  . Alcohol use: Not Currently    Alcohol/week: 1.0 standard drinks    Types: 1 Glasses of wine per week    Frequency: Never  . Drug use: Never    Allergies:  Allergies  Allergen Reactions  . Nitrous Oxide Anaphylaxis  . Peach Flavor Anaphylaxis   . Shellfish Allergy Anaphylaxis  . Adhesive [Tape] Hives, Itching and Other (See Comments)    blisters  . Reglan [Metoclopramide]     Muscle spasms    Medications Prior to Admission  Medication Sig Dispense Refill Last Dose  . Prenatal Vit-Fe Fumarate-FA (PRENATAL MULTIVITAMIN) TABS tablet Take 1 tablet by mouth daily at 12 noon.   07/24/2019 at Unknown time  . promethazine (PHENERGAN) 25 MG tablet Take 1/2-1 tablet three times daily as needed for headache or nausea   07/24/2019 at Unknown time  . propranolol (INDERAL) 10 MG tablet TAKE 1 TABLET BY MOUTH DAILY AS NEEDED (Patient taking differently: Take 10 mg by mouth daily as needed (svt). ) 30 tablet 10 Past Week at Unknown time  . zolpidem (AMBIEN) 10 MG tablet Take 10 mg by mouth at bedtime.   07/24/2019 at Unknown time  . diclofenac (CATAFLAM) 50 MG tablet Take 1 tablet (50 mg total) by mouth 3 (three) times daily. 40 tablet 1   . Erenumab-aooe (AIMOVIG) 140 MG/ML SOAJ Inject 140 mg into the skin every 30 (thirty) days.     . ferrous sulfate 325 (65 FE)  MG tablet Take 325 mg by mouth 3 (three) times daily.     . methocarbamol (ROBAXIN) 750 MG tablet Take 750 mg by mouth 3 (three) times daily as needed (for headache).      . ondansetron (ZOFRAN) 8 MG tablet Take 8 mg by mouth every 8 (eight) hours as needed for nausea or vomiting.        Review of Systems  Constitutional: Negative.   Respiratory: Negative.   Cardiovascular: Negative.   Gastrointestinal: Positive for abdominal pain and nausea. Negative for constipation, diarrhea and vomiting.  Genitourinary: Positive for vaginal bleeding. Negative for difficulty urinating, dysuria, frequency, menstrual problem and urgency.  Musculoskeletal: Negative.   Neurological: Negative.    Physical Exam   Blood pressure 110/73, pulse 71, resp. rate 18, height 5\' 7"  (1.702 m), weight 69.4 kg, last menstrual period 06/01/2019, SpO2 99 %.  Physical Exam  Nursing note and vitals  reviewed. Constitutional: She is oriented to person, place, and time. She appears well-developed and well-nourished. No distress.  Cardiovascular: Normal rate and regular rhythm.  Respiratory: Effort normal and breath sounds normal. No respiratory distress. She has no wheezes.  GI: Soft. She exhibits no distension. There is abdominal tenderness. There is no rebound and no guarding.  Tenderness on LLQ  Genitourinary:    No vaginal bleeding.  No bleeding in the vagina.    Genitourinary Comments: Pelvic exam: Cervix pink, visually closed, without lesion, small amount of brown discharge- no vaginal bleeding, vaginal walls and external genitalia normal   Musculoskeletal: Normal range of motion.        General: No edema.  Neurological: She is alert and oriented to person, place, and time.  Psychiatric: She has a normal mood and affect. Her behavior is normal. Thought content normal.    MAU Course  Procedures  MDM Orders Placed This Encounter  Procedures  . Wet prep, genital  . US OB LESS THAN 14 WEEKS WITH OB TRANSVAGINAL  . Urinalysis, Routine w reflex microscopic  . CBC  . hCG, quantitative, pregnancy  . Pregnancy, urine POC  . ABO/Rh   Labs and US report reviewed:  Results for orders placed or performed during the hospital encounter of 07/25/19 (from the past 24 hour(s))  Urinalysis, Routine w reflex microscopic     Status: Abnormal   Collection Time: 07/25/19  9:16 PM  Result Value Ref Range   Color, Urine COLORLESS (A) YELLOW   APPearance CLEAR CLEAR   Specific Gravity, Urine 1.002 (L) 1.005 - 1.030   pH 7.0 5.0 - 8.0   Glucose, UA NEGATIVE NEGATIVE mg/dL   Hgb urine dipstick SMALL (A) NEGATIVE   Bilirubin Urine NEGATIVE NEGATIVE   Ketones, ur NEGATIVE NEGATIVE mg/dL   Protein, ur NEGATIVE NEGATIVE mg/dL   Nitrite NEGATIVE NEGATIVE   Leukocytes,Ua NEGATIVE NEGATIVE   RBC / HPF 0-5 0 - 5 RBC/hpf   WBC, UA 0-5 0 - 5 WBC/hpf   Bacteria, UA NONE SEEN NONE SEEN  Pregnancy,  urine POC     Status: Abnormal   Collection Time: 07/25/19  9:22 PM  Result Value Ref Range   Preg Test, Ur POSITIVE (A) NEGATIVE  Wet prep, genital     Status: Abnormal   Collection Time: 07/25/19 10:00 PM   Specimen: PATH Cytology Cervicovaginal Ancillary Only  Result Value Ref Range   Yeast Wet Prep HPF POC NONE SEEN NONE SEEN   Trich, Wet Prep NONE SEEN NONE SEEN   Clue Cells Wet Prep HPF POC  NONE SEEN NONE SEEN   WBC, Wet Prep HPF POC FEW (A) NONE SEEN   Sperm NONE SEEN   CBC     Status: Abnormal   Collection Time: 07/25/19 10:05 PM  Result Value Ref Range   WBC 7.9 4.0 - 10.5 K/uL   RBC 4.00 3.87 - 5.11 MIL/uL   Hemoglobin 12.8 12.0 - 15.0 g/dL   HCT 80.2 (L) 23.3 - 61.2 %   MCV 89.3 80.0 - 100.0 fL   MCH 32.0 26.0 - 34.0 pg   MCHC 35.9 30.0 - 36.0 g/dL   RDW 24.4 97.5 - 30.0 %   Platelets 246 150 - 400 K/uL   nRBC 0.0 0.0 - 0.2 %  ABO/Rh     Status: None   Collection Time: 07/25/19 10:05 PM  Result Value Ref Range   ABO/RH(D)      A POS Performed at [facility], [address] [place], [place] [address]   hCG, quantitative, pregnancy     Status: Abnormal   Collection Time: 07/25/19 10:05 PM  Result Value Ref Range   hCG, Beta Chain, Quant, S 128,217 (H) <5 mIU/mL   US Ob Comp Less 14 Wks  Result Date: 07/25/2019 CLINICAL DATA:  Positive urine pregnancy test, left lower quadrant pain EXAM: OBSTETRIC <14 WK ULTRASOUND TECHNIQUE: Transabdominal ultrasound was performed for evaluation of the gestation as well as the maternal uterus and adnexal regions. COMPARISON:  None. FINDINGS: Intrauterine gestational sac: Single Yolk sac:  Visualized. Embryo:  Visualized. Cardiac Activity: Visualized. Heart Rate: 169 bpm CRL: 16.3 mm   8 w 0 d                  [place] EDC: 03/05/2020 Subchorionic hemorrhage:  None visualized. Maternal uterus/adnexae: Ovaries are within normal limits. Right ovary measures 1.9 by 3 x 1.5 cm. The left ovary measures 2.7 x 2.3 x 2.5 cm. No significant  free fluid. IMPRESSION: Single viable intrauterine pregnancy as above. No specific abnormality is seen. Electronically Signed   By: Jasmine Pang M.D.   On: 07/25/2019 22:43   Discussed results of [place] and labs with patient. GC/C pending, wet prep negative. IUP with FHR noted. Educated and discussed reasons to return to MAU. Follow up as scheduled for prenatal appointment. Pt stable at time of discharge.   Assessment and Plan   1. Normal intrauterine pregnancy on prenatal ultrasound in first trimester   2. Abdominal pain during pregnancy in first trimester   3. Vaginal bleeding during pregnancy   4. Nausea and vomiting during pregnancy    Discharge home Follow up as scheduled for initial prenatal appointment  Return to MAU as needed Continue medication as prescribed  Encouraged pelvic rest for 1 week  Work note given to patient   Follow-up Information    [facility] Follow up.   Specialty: Obstetrics and Gynecology Why: Follow up as scheduled for prenatal appointment  Contact information: [address] [place] [address] [phone]          Sharyon Cable CNM 07/26/2019, 2:24 AM

## 2019-07-25 NOTE — MAU Note (Signed)
Lower left abd pain x 1 week, cramps.  Vag bleeding just started x 40 min.  First noticed in underwear and when wiped then when came here it was like when starting a period.  LMP:  July 26th.  First appt Oct 6th at [facility]- Dr Nehemiah Settle.

## 2019-07-26 LAB — ABO/RH: ABO/RH(D): A POS

## 2019-07-27 ENCOUNTER — Other Ambulatory Visit: Payer: Self-pay | Admitting: Certified Nurse Midwife

## 2019-07-29 ENCOUNTER — Encounter: Payer: Self-pay | Admitting: Advanced Practice Midwife

## 2019-07-29 ENCOUNTER — Inpatient Hospital Stay (HOSPITAL_COMMUNITY)
Admission: AD | Admit: 2019-07-29 | Discharge: 2019-07-30 | Disposition: A | Discharge status: Home / Self Care | Payer: 59 | Attending: Family Medicine | Admitting: Family Medicine

## 2019-07-29 ENCOUNTER — Other Ambulatory Visit: Payer: Self-pay

## 2019-07-29 DIAGNOSIS — R109 Unspecified abdominal pain: Secondary | ICD-10-CM

## 2019-07-29 DIAGNOSIS — O209 Hemorrhage in early pregnancy, unspecified: Secondary | ICD-10-CM | POA: Insufficient documentation

## 2019-07-29 DIAGNOSIS — O26899 Other specified pregnancy related conditions, unspecified trimester: Secondary | ICD-10-CM

## 2019-07-29 DIAGNOSIS — Z79899 Other long term (current) drug therapy: Secondary | ICD-10-CM | POA: Insufficient documentation

## 2019-07-29 DIAGNOSIS — Z3A08 8 weeks gestation of pregnancy: Secondary | ICD-10-CM | POA: Insufficient documentation

## 2019-07-29 DIAGNOSIS — R1032 Left lower quadrant pain: Secondary | ICD-10-CM | POA: Insufficient documentation

## 2019-07-29 DIAGNOSIS — O26891 Other specified pregnancy related conditions, first trimester: Secondary | ICD-10-CM | POA: Insufficient documentation

## 2019-07-29 DIAGNOSIS — R102 Pelvic and perineal pain: Secondary | ICD-10-CM | POA: Insufficient documentation

## 2019-07-29 LAB — CERVICOVAGINAL ANCILLARY ONLY
Chlamydia: NEGATIVE
Neisseria Gonorrhea: NEGATIVE

## 2019-07-29 NOTE — MAU Note (Signed)
Was here previously and was wrote to be out of work and bleeding improved but then when returned to work on Monday, dark brown bleeding - filling a panty liner.  Low abd cramping worsened also on left side and pressure on both sides of lower abd.

## 2019-07-30 ENCOUNTER — Encounter (HOSPITAL_COMMUNITY): Payer: Self-pay

## 2019-07-30 DIAGNOSIS — R109 Unspecified abdominal pain: Secondary | ICD-10-CM

## 2019-07-30 DIAGNOSIS — Z3A08 8 weeks gestation of pregnancy: Secondary | ICD-10-CM | POA: Diagnosis not present

## 2019-07-30 DIAGNOSIS — O209 Hemorrhage in early pregnancy, unspecified: Secondary | ICD-10-CM

## 2019-07-30 DIAGNOSIS — Z79899 Other long term (current) drug therapy: Secondary | ICD-10-CM | POA: Diagnosis not present

## 2019-07-30 DIAGNOSIS — R102 Pelvic and perineal pain: Secondary | ICD-10-CM | POA: Diagnosis not present

## 2019-07-30 DIAGNOSIS — O26891 Other specified pregnancy related conditions, first trimester: Secondary | ICD-10-CM | POA: Diagnosis not present

## 2019-07-30 DIAGNOSIS — R1032 Left lower quadrant pain: Secondary | ICD-10-CM | POA: Diagnosis not present

## 2019-07-30 LAB — URINALYSIS, ROUTINE W REFLEX MICROSCOPIC
Bilirubin Urine: NEGATIVE
Glucose, UA: NEGATIVE mg/dL
Hgb urine dipstick: NEGATIVE
Ketones, ur: 5 mg/dL — AB
Leukocytes,Ua: NEGATIVE
Nitrite: NEGATIVE
Protein, ur: NEGATIVE mg/dL
Specific Gravity, Urine: 1.03 (ref 1.005–1.030)
pH: 5 (ref 5.0–8.0)

## 2019-07-30 NOTE — MAU Provider Note (Signed)
Chief Complaint: Abdominal Pain and Vaginal Bleeding   First Provider Initiated Contact with Patient 07/30/19 0020       SUBJECTIVE HPI: Michelle Vincent is a 28 y.o. G1P0000 at [redacted]w[redacted]d by LMP who presents to maternity admissions reporting brown discharge which has persisted since last visit.  Has had some persistent cramping too. 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 She denies vaginal itching/burning, urinary symptoms, h/a, dizziness, n/v, or fever/chills.   Has New OB appt at [facility] in 2 weeks  RN Note: Was here previously and was wrote to be out of work and bleeding improved but then when returned to work on Monday, dark brown bleeding - filling a panty liner.  Low abd cramping worsened also on left side and pressure on both sides of lower abd.     Past Medical History:  Diagnosis Date  . HA (headache)   . Occipital neuralgia   . Sinus tachycardia   . SVT (supraventricular tachycardia) (HCC)    Past Surgical History:  Procedure Laterality Date  . shoulder Right   . TONSILLECTOMY AND ADENOIDECTOMY     Social History   Socioeconomic History  . Marital status: Single    Spouse name: Not on file  . Number of children: Not on file  . Years of education: Not on file  . Highest education level: Not on file  Occupational History  . Not on file  Social Needs  . Financial resource strain: Not on file  . Food insecurity    Worry: Not on file    Inability: Not on file  . Transportation needs    Medical: Not on file    Non-medical: Not on file  Tobacco Use  . Smoking status: Never Smoker  . Smokeless tobacco: Never Used  Substance and Sexual Activity  . Alcohol use: Not Currently    Alcohol/week: 1.0 standard drinks    Types: 1 Glasses of wine per week    Frequency: Never  . Drug use: Never  . Sexual activity: Yes  Lifestyle  . Physical activity    Days per week: Not on file    Minutes per session: Not on file  . Stress:  Not on file  Relationships  . Social Musician on phone: Not on file    Gets together: Not on file    Attends religious service: Not on file    Active member of club or organization: Not on file    Attends meetings of clubs or organizations: Not on file    Relationship status: Not on file  . Intimate partner violence    Fear of current or ex partner: Not on file    Emotionally abused: Not on file    Physically abused: Not on file    Forced sexual activity: Not on file  Other Topics Concern  . Not on file  Social History Narrative   EMT   No current facility-administered medications on file prior to encounter.    Current Outpatient Medications on File Prior to Encounter  Medication Sig Dispense Refill  . Prenatal Vit-Fe Fumarate-FA (PRENATAL MULTIVITAMIN) TABS tablet Take 1 tablet by mouth daily at 12 noon.    . propranolol (INDERAL) 10 MG tablet TAKE 1 TABLET BY MOUTH DAILY AS NEEDED (Patient taking differently: Take 10 mg by mouth daily as needed (svt). ) 30 tablet 10  . zolpidem (AMBIEN) 10 MG tablet Take 10 mg by mouth at bedtime.    Marland Kitchen  ferrous sulfate 325 (65 FE) MG tablet Take 325 mg by mouth 3 (three) times daily.    . methocarbamol (ROBAXIN) 750 MG tablet Take 750 mg by mouth 3 (three) times daily as needed (for headache).     . prochlorperazine (COMPAZINE) 10 MG tablet Take 1 tablet (10 mg total) by mouth 2 (two) times daily as needed for nausea or vomiting. 20 tablet 0   Allergies  Allergen Reactions  . Nitrous Oxide Anaphylaxis  . Peach Flavor Anaphylaxis  . Shellfish Allergy Anaphylaxis  . Adhesive [Tape] Hives, Itching and Other (See Comments)    blisters  . Reglan [Metoclopramide]     Muscle spasms    I have reviewed patient's Past Medical Hx, Surgical Hx, Family Hx, Social Hx, medications and allergies.   ROS:  Review of Systems  Constitutional: Negative for chills and fever.  Respiratory: Negative for shortness of breath.   Gastrointestinal:  Negative for constipation, diarrhea and nausea.  Genitourinary: Positive for pelvic pain (cramping) and vaginal bleeding (brown, no new bleeding).   Review of Systems  Other systems negative   Physical Exam  Physical Exam Patient Vitals for the past 24 hrs:  BP Temp Temp src Pulse Resp SpO2 Height Weight  07/30/19 0004 111/63 98 F (36.7 C) Oral 97 18 100 % 5\' 7"  (1.702 m) 70.3 kg   Constitutional: Well-developed, well-nourished female in no acute distress.  Cardiovascular: normal rate Respiratory: normal effort GI: Abd soft, non-tender. Pos BS x 4 MS: Extremities nontender, no edema, normal ROM Neurologic: Alert and oriented x 4.  GU: Neg CVAT.  PELVIC EXAM: Minimal brown discharge.  Pelvic deferred since I am doing a bedside US  Bedside US done to verify fetal viability Pt informed that the ultrasound is considered a limited OB ultrasound and is not intended to be a complete ultrasound exam.  Patient also informed that the ultrasound is not being completed with the intent of assessing for fetal or placental anomalies or any pelvic abnormalities.  Explained that the purpose of today's ultrasound is to assess for Fetal viability.  Patient acknowledges the purpose of the exam and the limitations of the study.    Single intrauterine pregnancy seen Normal shape to Gestational sac Fetus with FHR 160 Yolk sac visible  LAB RESULTS Results for orders placed or performed during the hospital encounter of 07/29/19 (from the past 24 hour(s))  Urinalysis, Routine w reflex microscopic     Status: Abnormal   Collection Time: 07/30/19 12:06 AM  Result Value Ref Range   Color, Urine YELLOW YELLOW   APPearance CLEAR CLEAR   Specific Gravity, Urine 1.030 1.005 - 1.030   pH 5.0 5.0 - 8.0   Glucose, UA NEGATIVE NEGATIVE mg/dL   Hgb urine dipstick NEGATIVE NEGATIVE   Bilirubin Urine NEGATIVE NEGATIVE   Ketones, ur 5 (A) NEGATIVE mg/dL   Protein, ur NEGATIVE NEGATIVE mg/dL   Nitrite NEGATIVE  NEGATIVE   Leukocytes,Ua NEGATIVE NEGATIVE    --/--/A POS (09/18 2205)  IMAGING (from last visit) US Ob Comp Less 14 Wks  Result Date: 07/25/2019 CLINICAL DATA:  Positive urine pregnancy test, left lower quadrant pain EXAM: OBSTETRIC <14 WK ULTRASOUND TECHNIQUE: Transabdominal ultrasound was performed for evaluation of the gestation as well as the maternal uterus and adnexal regions. COMPARISON:  None. FINDINGS: Intrauterine gestational sac: Single Yolk sac:  Visualized. Embryo:  Visualized. Cardiac Activity: Visualized. Heart Rate: 169 bpm CRL: 16.3 mm   8 w 0 d  [place] EDC: 03/05/2020 Subchorionic hemorrhage:  None visualized. Maternal uterus/adnexae: Ovaries are within normal limits. Right ovary measures 1.9 by 3 x 1.5 cm. The left ovary measures 2.7 x 2.3 x 2.5 cm. No significant free fluid. IMPRESSION: Single viable intrauterine pregnancy as above. No specific abnormality is seen. Electronically Signed   By: Jasmine Pang M.D.   On: 07/25/2019 22:43    MAU Management/MDM: Discussed symptoms Manson Passey discharge is same as what she had previously.  Has only had one liner filled today Cramping seems a little more frequent than before but not severe Discussed nature of first trimester bleeding, may persist for a few weeks.  Brown discharge is old blood US showed viable fetus, pt reassured  WIll likely have another [place] at new OB  ASSESSMENT Single intrauterine pregnancy at [redacted]w[redacted]d Live fetus seen [place] discharge, likely old blood Pelvic cramping, no evidence of impending miscarriage   PLAN Discharge home Keep appt for new OB with me in a few weeks Pelvic rest Note for no heavy lifting, light duty until bleeding resolved  Pt stable at time of discharge. Encouraged to return here or to other Urgent Care/ED if she develops worsening of symptoms, increase in pain, fever, or other concerning symptoms.    Wynelle Bourgeois CNM, MSN Certified Nurse-Midwife 07/30/2019  12:42 AM

## 2019-07-30 NOTE — Discharge Instructions (Signed)
Activity Restriction During Pregnancy °Your health care provider may recommend specific activity restrictions during pregnancy for a variety of reasons. Activity restriction may require that you limit activities that require great effort, such as exercise, lifting, or sex. °The type of activity restriction will vary for each person, depending on your risk or the problems you are having. Activity restriction may be recommended for a period of time until your baby is delivered. °Why are activity restrictions recommended? °Activity restriction may be recommended if: °· Your placenta is partially or completely covering the opening of your cervix (placenta previa). °· There is bleeding between the wall of the uterus and the amniotic sac in the first trimester of pregnancy (subchorionic hemorrhage). °· You went into labor too early (preterm labor). °· You have a history of miscarriage. °· You have a condition that causes high blood pressure during pregnancy (preeclampsia or eclampsia). °· You are pregnant with more than one baby. °· Your baby is not growing well. °What are the risks? °The risks depend on your specific restriction. Strict bed rest has the most physical and emotional risks and is no longer routinely recommended. Risks of strict bed rest include: °· Loss of muscle conditioning from not moving. °· Blood clots. °· Social isolation. °· Depression. °· Loss of income. °Talk with your health care team about activity restriction to decide if it is best for you and your baby. Even if you are having problems during your pregnancy, you may be able to continue with normal levels of activity with careful monitoring by your health care team. °Follow these instructions at home: °If needed, based on your overall health and the health of your baby, your health care provider will decide which type of activity restriction is right for you. Activity restrictions may include: °· Not lifting anything heavier than 10 pounds (4.5  kg). °· Avoiding activities that take a lot of physical effort. °· No lifting or straining. °· Resting in a sitting position or lying down for periods of time during the day. °Pelvic rest may be recommended along with activity restrictions. If pelvic rest is recommended, then: °· Do not have sex, an orgasm, or use sexual stimulation. °· Do not use tampons. Do not douche. Do not put anything into your vagina. °· Do not lift anything that is heavier than 10 lb (4.5 kg). °· Avoid activities that require a lot of effort. °· Avoid any activity in which your pelvic muscles could become strained, such as squatting. °Questions to ask your health care provider °· Why is my activity being limited? °· How will activity restrictions affect my body? °· Why is rest helpful for me and my baby? °· What activities can I do? °· When can I return to normal activities? °When should I seek immediate medical care? °Seek immediate medical care if you have: °· Vaginal bleeding. °· Vaginal discharge. °· Cramping pain in your lower abdomen. °· Regular contractions. °· A low, dull backache. °Summary °· Your health care provider may recommend specific activity restrictions during pregnancy for a variety of reasons. °· Activity restriction may require that you limit activities such as exercise, lifting, sex, or any other activity that requires great effort. °· Discuss the risks and benefits of activity restriction with your health care team to decide if it is best for you and your baby. °· Contact your health care provider right away if you think you are having contractions, or if you notice vaginal bleeding, discharge, or cramping. °This information is not   intended to replace advice given to you by your health care provider. Make sure you discuss any questions you have with your health care provider. °Document Released: 02/17/2011 Document Revised: 02/12/2018 Document Reviewed: 02/12/2018 °Elsevier Patient Education © 2020 [employer]  Inc[employer] ° °Vaginal Bleeding During Pregnancy, First Trimester ° °A small amount of bleeding (spotting) from the vagina is common during early pregnancy. Sometimes the bleeding is normal and does not cause problems. At other times, though, bleeding may be a sign of something serious. Tell your doctor about any bleeding from your vagina right away. °Follow these instructions at home: °Activity °· Follow your doctor's instructions about how active you can be. °· If needed, make plans for someone to help with your normal activities. °· Do not have sex or orgasms until your doctor says that this is safe. °General instructions °· Take over-the-counter and prescription medicines only as told by your doctor. °· Watch your condition for any changes. °· Write down: °? The number of pads you use each day. °? How often you change pads. °? How soaked (saturated) your pads are. °· Do not use tampons. °· Do not douche. °· If you pass any tissue from your vagina, save it to show to your doctor. °· Keep all follow-up visits as told by your doctor. This is important. °Contact a doctor if: °· You have vaginal bleeding at any time while you are pregnant. °· You have cramps. °· You have a fever. °Get help right away if: °· You have very bad cramps in your back or belly (abdomen). °· You pass large clots or a lot of tissue from your vagina. °· Your bleeding gets worse. °· You feel light-headed. °· You feel weak. °· You pass out (faint). °· You have chills. °· You are leaking fluid from your vagina. °· You have a gush of fluid from your vagina. °Summary °· Sometimes vaginal bleeding during pregnancy is normal and does not cause problems. At other times, bleeding may be a sign of something serious. °· Tell your doctor about any bleeding from your vagina right away. °· Follow your doctor's instructions about how active you can be. You may need someone to help you with your normal activities. °This information is not intended to replace advice  given to you by your health care provider. Make sure you discuss any questions you have with your health care provider. °Document Released: 03/09/2014 Document Revised: 02/11/2019 Document Reviewed: 01/24/2017 °Elsevier Patient Education © 2020 Elsevier Inc. ° °

## 2019-08-08 ENCOUNTER — Telehealth: Payer: Self-pay

## 2019-08-08 ENCOUNTER — Other Ambulatory Visit: Payer: Self-pay | Admitting: Obstetrics & Gynecology

## 2019-08-08 DIAGNOSIS — O219 Vomiting of pregnancy, unspecified: Secondary | ICD-10-CM

## 2019-08-08 MED ORDER — DOXYLAMINE-PYRIDOXINE 10-10 MG PO TBEC: 2.0000 | DELAYED_RELEASE_TABLET | Freq: Every day | ORAL | 5 refills | Status: DC

## 2019-08-08 NOTE — Progress Notes (Signed)
Patient called and made aware that diclegis Rx has been sent to her pharmacy. Explained how she should take it. Patient also made aware that physcian advises if she is unable to keep down liquids then she should go to [facility] for evaluation. Patient agrees with plan. Kathrene Alu RN

## 2019-08-08 NOTE — Telephone Encounter (Signed)
Patient called and will be a new ob on Tuesday OCt 6th Patient states she is in healthcare (EMT) and wonder if she can get a flu shot since she is nine weeks pregnant - let her know she can.   Patient called stating she is nauseated and vomiting throughout the day. Patient has tried vitamin b6 and unisom, phenergan, zofran and compazine and none are helping. Patient requesting something to help.  Patient also takes propanolol as need for increased heart rate. Patient states her heart rate resting is hanging out at 130 bpm and is concerned.   Will route to provider for input. Kathrene Alu RN

## 2019-08-11 ENCOUNTER — Other Ambulatory Visit: Payer: Self-pay

## 2019-08-11 ENCOUNTER — Telehealth: Payer: Self-pay

## 2019-08-11 DIAGNOSIS — O219 Vomiting of pregnancy, unspecified: Secondary | ICD-10-CM

## 2019-08-11 MED ORDER — DOXYLAMINE SUCCINATE (SLEEP) 25 MG PO TABS: 25.0000 mg | ORAL_TABLET | Freq: Every evening | ORAL | 0 refills | Status: DC | PRN

## 2019-08-11 MED ORDER — VITAMIN B-6 100 MG PO TABS: 100.0000 mg | ORAL_TABLET | Freq: Every day | ORAL | 2 refills | Status: DC

## 2019-08-11 NOTE — Telephone Encounter (Signed)
Pt called the office back and was made aware that Unisom and Vitamin B6 were sent to her pharmacy because her insurance company does not cover Diclegis. Understanding was voiced.  chiquita l wilson, CMA

## 2019-08-11 NOTE — Progress Notes (Signed)
Called pt regarding Rx for Unisom and Vitamin B6.Left message for pt to call the office back. chiquita l wilson, CMA

## 2019-08-12 ENCOUNTER — Ambulatory Visit (INDEPENDENT_AMBULATORY_CARE_PROVIDER_SITE_OTHER): Payer: 59 | Admitting: Advanced Practice Midwife

## 2019-08-12 ENCOUNTER — Other Ambulatory Visit: Payer: Self-pay

## 2019-08-12 ENCOUNTER — Encounter: Payer: Self-pay | Admitting: Advanced Practice Midwife

## 2019-08-12 VITALS — BP 102/65 | HR 77 | Wt 158.0 lb

## 2019-08-12 DIAGNOSIS — Z3491 Encounter for supervision of normal pregnancy, unspecified, first trimester: Secondary | ICD-10-CM

## 2019-08-12 DIAGNOSIS — Z3400 Encounter for supervision of normal first pregnancy, unspecified trimester: Secondary | ICD-10-CM | POA: Insufficient documentation

## 2019-08-12 DIAGNOSIS — J309 Allergic rhinitis, unspecified: Secondary | ICD-10-CM | POA: Insufficient documentation

## 2019-08-12 DIAGNOSIS — Z3A10 10 weeks gestation of pregnancy: Secondary | ICD-10-CM | POA: Diagnosis not present

## 2019-08-12 DIAGNOSIS — O219 Vomiting of pregnancy, unspecified: Secondary | ICD-10-CM

## 2019-08-12 MED ORDER — PROCHLORPERAZINE MALEATE 10 MG PO TABS: 10.0000 mg | ORAL_TABLET | Freq: Three times a day (TID) | ORAL | 0 refills | Status: DC | PRN

## 2019-08-12 NOTE — Progress Notes (Signed)
  Subjective:    Michelle Vincent is being seen today for her first obstetrical visit.  This is a planned pregnancy. She is at [redacted]w[redacted]d gestation. Her obstetrical history is significant for First trimester bleeding, history of SVT, hx migraines. Relationship with FOB: spouse, living together. Patient does intend to breast feed. Pregnancy history fully reviewed.  Patient reports nausea and vomiting.  Compazine works best.  Has not filled Diclegis yet.   Review of Systems:   Review of Systems  Constitutional: Negative for chills and fever.  Respiratory: Negative for shortness of breath.   Gastrointestinal: Positive for nausea and vomiting. Negative for abdominal pain, constipation and diarrhea.  Genitourinary: Negative for vaginal bleeding (resolved).    Objective:     BP 102/65   Pulse 77   Wt 71.7 kg   LMP 06/01/2019   BMI 24.75 kg/m  Physical Exam  Constitutional: She is oriented to person, place, and time. She appears well-developed and well-nourished. No distress.  HENT:  Head: Normocephalic.  Cardiovascular: Normal rate and regular rhythm.  Respiratory: Effort normal. No respiratory distress.  GI: Soft. She exhibits no distension. There is no abdominal tenderness. There is no rebound and no guarding.  Genitourinary:    Vulva, vagina and uterus normal.     No vaginal discharge.   Musculoskeletal: Normal range of motion.  Neurological: She is alert and oriented to person, place, and time.  Skin: Skin is warm and dry.  Psychiatric: She has a normal mood and affect.    Maternal Exam:  Abdomen: Patient reports no abdominal tenderness. Introitus: Normal vulva. Normal vagina.  Vagina is negative for discharge.    FHR 164    Assessment:    Pregnancy: G1P0000 Patient Active Problem List   Diagnosis Date Noted  . Supervision of normal first pregnancy, antepartum 08/12/2019  . Tachycardia 03/03/2019       Plan:     Initial labs drawn. Prenatal vitamins. Problem  list reviewed and updated. Panorama discussed: requested. Role of ultrasound in pregnancy discussed; fetal survey: requested. Amniocentesis discussed: not indicated. Follow up in 4 weeks. 50% of 30 min visit spent on counseling and coordination of care.   Welcomed to practice Routines reviewed, incluiding how practice works and learners. Rx for compazine refilled    Hansel Feinstein 08/12/2019

## 2019-08-12 NOTE — Progress Notes (Deleted)
  Subjective:    Michelle Vincent is being seen today for her first obstetrical visit.  This {is/is not:9024} a planned pregnancy. She is at [redacted]w[redacted]d gestation. Her obstetrical history is significant for {ob risk factors:10154}. Relationship with FOB: {fob:16621}. Patient {does/does not:19097} intend to breast feed. Pregnancy history fully reviewed.  Patient reports {sx:14538}.  Review of Systems:   Review of Systems  Objective:     BP 102/65   Pulse 77   Wt 71.7 kg   LMP 06/01/2019   BMI 24.75 kg/m  Physical Exam  Exam    Assessment:    Pregnancy: G1P0000 Patient Active Problem List   Diagnosis Date Noted  . Supervision of normal first pregnancy, antepartum 08/12/2019  . Allergic rhinitis 08/12/2019  . Tachycardia 03/03/2019  . 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 Mild intermittent asthma without complication 70/62/3762  . Chronic migraine without aura without status migrainosus, not intractable 05/24/2017  . 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 Prenatal vitamins. Problem list reviewed and updated. AFP3 discussed: {requests/ordered/declines:14581}. Role of ultrasound in pregnancy discussed; fetal survey: {requests/ordered/declines:14581}. Amniocentesis discussed: {amniocentesis:14582}. Follow up in {numbers 0-4:31231} weeks. ***% of *** min visit spent on counseling and coordination of care.  ***   Hansel Feinstein 08/12/2019

## 2019-08-12 NOTE — Patient Instructions (Addendum)
Genetic Testing During Pregnancy Genetic testing during pregnancy is also called prenatal genetic testing. This type of testing can determine if your baby is at risk of being born with a disorder caused by abnormal genes or chromosomes (genetic disorder). Chromosomes contain genes that control how your baby will develop in your womb. There are many different genetic disorders. Examples of genetic disorders that may be found through genetic testing include Down syndrome and cystic fibrosis. Gene changes (mutations) can be passed down through families. Genetic testing is offered to all women before or during pregnancy. You can choose whether to have genetic testing. Why is genetic testing done? Genetic testing is done during pregnancy to find out whether your child is at risk for a genetic disorder. Having genetic testing allows you to:  Discuss your test results and options with a genetic counselor.  Prepare for a baby that may be born with a genetic disorder. Learning about the disorder ahead of time helps you be better prepared to manage it. Your health care providers can also be prepared in case your baby requires special care before or after birth.  Consider whether you want to continue with the pregnancy. In some cases, genetic testing may be done to learn about the traits a child will inherit. Types of genetic tests There are two basic types of genetic testing. Screening tests indicate whether your developing baby (fetus) is at higher risk for a genetic disorder. Diagnostic tests check actual fetal cells to diagnose a genetic disorder. Screening tests     Screening tests will not harm your baby. They are recommended for all pregnant women. Types of screening tests include:  Carrier screening. This test involves checking genes from both parents by testing their blood or saliva. The test checks to find out if the parents carry a genetic mutation that may be passed to a baby. In most cases,  both parents must carry the mutation for a baby to be at risk.  First trimester screening. This test combines a blood test with sound wave imaging of your baby (fetal ultrasound). This screening test checks for a risk of Down syndrome or other defects caused by having extra chromosomes. It also checks for defects of the heart, abdomen, or skeleton.  Second trimester screening also combines a blood test with a fetal ultrasound exam. It checks for a risk of genetic defects of the face, brain, spine, heart, or limbs.  Combined or sequential screening. This type of testing combines the results of first and second trimester screening. This type of testing may be more accurate than first or second trimester screening alone.  Cell-free DNA testing. This is a blood test that detects cells released by the placenta that get into the mother's blood. It can be used to check for a risk of Down syndrome, other extra chromosome syndromes, and disorders caused by abnormal numbers of sex chromosomes. This test can be done any time after 10 weeks of pregnancy.  Diagnostic tests Diagnostic tests carry slight risks of problems, including bleeding, infection, and loss of the pregnancy. These tests are done only if your baby is at risk for a genetic disorder. You may meet with a genetic counselor to discuss the risks and benefits before having diagnostic tests. Examples of diagnostic tests include:  Chorionic villus sampling (CVS). This involves a procedure to remove and test a sample of cells taken from the placenta. The procedure may be done between 10 and 12 weeks of pregnancy.  Amniocentesis. This involves a  procedure to remove and test a sample of fluid (amniotic fluid) and cells from the sac that surrounds the developing baby. The procedure may be done between 15 and 20 weeks of pregnancy. What do the results mean? For a screening test:  If the results are negative, it often means that your child is not at higher  risk. There is still a slight chance your child could have a genetic disorder.  If the results are positive, it does not mean your child will have a genetic disorder. It may mean that your child has a higher-than-normal risk for a genetic disorder. In that case, you may want to talk with a genetic counselor about whether you should have diagnostic genetic tests. For a diagnostic test:  If the result is negative, it is unlikely that your child will have a genetic disorder.  If the test is positive for a genetic disorder, it is likely that your child will have the disorder. The test may not tell how severe the disorder will be. Talk with your health care provider about your options. Questions to ask your health care provider Before talking to your health care provider about genetic testing, find out if there is a history of genetic disorders in your family. It may also help to know your family's ethnic origins. Then ask your health care provider the following questions:  Is my baby at risk for a genetic disorder?  What are the benefits of having genetic screening?  What tests are best for me and my baby?  What are the risks of each test?  If I get a positive result on a screening test, what is the next step?  Should I meet with a genetic counselor before having a diagnostic test?  Should my partner or other members of my family be tested?  How much do the tests cost? Will my insurance cover the testing? Summary  Genetic testing is done during pregnancy to find out whether your child is at risk for a genetic disorder.  Genetic testing is offered to all women before or during pregnancy. You can choose whether to have genetic testing.  There are two basic types of genetic testing. Screening tests indicate whether your developing baby (fetus) is at higher risk for a genetic disorder. Diagnostic tests check actual fetal cells to diagnose a genetic disorder.  If a diagnostic genetic test is  positive, talk with your health care provider about your options. This information is not intended to replace advice given to you by your health care provider. Make sure you discuss any questions you have with your health care provider. Document Released: 01/07/2018 Document Revised: 02/13/2019 Document Reviewed: 01/07/2018 Elsevier Patient Education  [place] of Pregnancy The first trimester of pregnancy is from week 1 until the end of week 13 (months 1 through 3). A week after a sperm fertilizes an egg, the egg will implant on the wall of the uterus. This embryo will begin to develop into a baby. Genes from you and your partner will form the baby. The female genes will determine whether the baby will be a boy or a girl. At 6-8 weeks, the eyes and face will be formed, and the heartbeat can be seen on ultrasound. At the end of 12 weeks, all the baby's organs will be formed. Now that you are pregnant, you will want to do everything you can to have a healthy baby. Two of the most important things are to get good prenatal  care and to follow your health care provider's instructions. Prenatal care is all the medical care you receive before the baby's birth. This care will help prevent, find, and treat any problems during the pregnancy and childbirth. Body changes during your first trimester Your body goes through many changes during pregnancy. The changes vary from woman to woman.  You may gain or lose a couple of pounds at first.  You may feel sick to your stomach (nauseous) and you may throw up (vomit). If the vomiting is uncontrollable, call your health care provider.  You may tire easily.  You may develop headaches that can be relieved by medicines. All medicines should be approved by your health care provider.  You may urinate more often. Painful urination may mean you have a bladder infection.  You may develop heartburn as a result of your pregnancy.  You may develop  constipation because certain hormones are causing the muscles that push stool through your intestines to slow down.  You may develop hemorrhoids or swollen veins (varicose veins).  Your breasts may begin to grow larger and become tender. Your nipples may stick out more, and the tissue that surrounds them (areola) may become darker.  Your gums may bleed and may be sensitive to brushing and flossing.  Dark spots or blotches (chloasma, mask of pregnancy) may develop on your face. This will likely fade after the baby is born.  Your menstrual periods will stop.  You may have a loss of appetite.  You may develop cravings for certain kinds of food.  You may have changes in your emotions from day to day, such as being excited to be pregnant or being concerned that something may go wrong with the pregnancy and baby.  You may have more vivid and strange dreams.  You may have changes in your hair. These can include thickening of your hair, rapid growth, and changes in texture. Some women also have hair loss during or after pregnancy, or hair that feels dry or thin. Your hair will most likely return to normal after your baby is born. What to expect at prenatal visits During a routine prenatal visit:  You will be weighed to make sure you and the baby are growing normally.  Your blood pressure will be taken.  Your abdomen will be measured to track your baby's growth.  The fetal heartbeat will be listened to between weeks 10 and 14 of your pregnancy.  Test results from any previous visits will be discussed. Your health care provider may ask you:  How you are feeling.  If you are feeling the baby move.  If you have had any abnormal symptoms, such as leaking fluid, bleeding, severe headaches, or abdominal cramping.  If you are using any tobacco products, including cigarettes, chewing tobacco, and electronic cigarettes.  If you have any questions. Other tests that may be performed during  your first trimester include:  Blood tests to find your blood type and to check for the presence of any previous infections. The tests will also be used to check for low iron levels (anemia) and protein on red blood cells (Rh antibodies). Depending on your risk factors, or if you previously had diabetes during pregnancy, you may have tests to check for high blood sugar that affects pregnant women (gestational diabetes).  Urine tests to check for infections, diabetes, or protein in the urine.  An ultrasound to confirm the proper growth and development of the baby.  Fetal screens for spinal cord problems (  spina bifida) and Down syndrome.  HIV (human immunodeficiency virus) testing. Routine prenatal testing includes screening for HIV, unless you choose not to have this test.  You may need other tests to make sure you and the baby are doing well. Follow these instructions at home: Medicines  Follow your health care provider's instructions regarding medicine use. Specific medicines may be either safe or unsafe to take during pregnancy.  Take a prenatal vitamin that contains at least 600 micrograms (mcg) of folic acid.  If you develop constipation, try taking a stool softener if your health care provider approves. Eating and drinking   Eat a balanced diet that includes fresh fruits and vegetables, whole grains, good sources of protein such as meat, eggs, or tofu, and low-fat dairy. Your health care provider will help you determine the amount of weight gain that is right for you.  Avoid raw meat and uncooked cheese. These carry germs that can cause birth defects in the baby.  Eating four or five small meals rather than three large meals a day may help relieve nausea and vomiting. If you start to feel nauseous, eating a few soda crackers can be helpful. Drinking liquids between meals, instead of during meals, also seems to help ease nausea and vomiting.  Limit foods that are high in fat and  processed sugars, such as fried and sweet foods.  To prevent constipation: ? Eat foods that are high in fiber, such as fresh fruits and vegetables, whole grains, and beans. ? Drink enough fluid to keep your urine clear or pale yellow. Activity  Exercise only as directed by your health care provider. Most women can continue their usual exercise routine during pregnancy. Try to exercise for 30 minutes at least 5 days a week. Exercising will help you: ? Control your weight. ? Stay in shape. ? Be prepared for labor and delivery.  Experiencing pain or cramping in the lower abdomen or lower back is a good sign that you should stop exercising. Check with your health care provider before continuing with normal exercises.  Try to avoid standing for long periods of time. Move your legs often if you must stand in one place for a long time.  Avoid heavy lifting.  Wear low-heeled shoes and practice good posture.  You may continue to have sex unless your health care provider tells you not to. Relieving pain and discomfort  Wear a good support bra to relieve breast tenderness.  Take warm sitz baths to soothe any pain or discomfort caused by hemorrhoids. Use hemorrhoid cream if your health care provider approves.  Rest with your legs elevated if you have leg cramps or low back pain.  If you develop varicose veins in your legs, wear support hose. Elevate your feet for 15 minutes, 3-4 times a day. Limit salt in your diet. Prenatal care  Schedule your prenatal visits by the twelfth week of pregnancy. They are usually scheduled monthly at first, then more often in the last 2 months before delivery.  Write down your questions. Take them to your prenatal visits.  Keep all your prenatal visits as told by your health care provider. This is important. Safety  Wear your seat belt at all times when driving.  Make a list of emergency phone numbers, including numbers for family, friends, the hospital,  and police and fire departments. General instructions  Ask your health care provider for a referral to a local prenatal education class. Begin classes no later than the beginning of month  6 of your pregnancy.  Ask for help if you have counseling or nutritional needs during pregnancy. Your health care provider can offer advice or refer you to specialists for help with various needs.  Do not use hot tubs, steam rooms, or saunas.  Do not douche or use tampons or scented sanitary pads.  Do not cross your legs for long periods of time.  Avoid cat litter boxes and soil used by cats. These carry germs that can cause birth defects in the baby and possibly loss of the fetus by miscarriage or stillbirth.  Avoid all smoking, herbs, alcohol, and medicines not prescribed by your health care provider. Chemicals in these products affect the formation and growth of the baby.  Do not use any products that contain nicotine or tobacco, such as cigarettes and e-cigarettes. If you need help quitting, ask your health care provider. You may receive counseling support and other resources to help you quit.  Schedule a dentist appointment. At home, brush your teeth with a soft toothbrush and be gentle when you floss. Contact a health care provider if:  You have dizziness.  You have mild pelvic cramps, pelvic pressure, or nagging pain in the abdominal area.  You have persistent nausea, vomiting, or diarrhea.  You have a bad smelling vaginal discharge.  You have pain when you urinate.  You notice increased swelling in your face, hands, legs, or ankles.  You are exposed to fifth disease or chickenpox.  You are exposed to [place] measles (rubella) and have never had it. Get help right away if:  You have a fever.  You are leaking fluid from your vagina.  You have spotting or bleeding from your vagina.  You have severe abdominal cramping or pain.  You have rapid weight gain or loss.  You vomit blood  or material that looks like coffee grounds.  You develop a severe headache.  You have shortness of breath.  You have any kind of trauma, such as from a fall or a car accident. Summary  The first trimester of pregnancy is from week 1 until the end of week 13 (months 1 through 3).  Your body goes through many changes during pregnancy. The changes vary from woman to woman.  You will have routine prenatal visits. During those visits, your health care provider will examine you, discuss any test results you may have, and talk with you about how you are feeling. This information is not intended to replace advice given to you by your health care provider. Make sure you discuss any questions you have with your health care provider. Document Released: 10/17/2001 Document Revised: 10/05/2017 Document Reviewed: 10/04/2016 Elsevier Patient Education  2020 [employer]

## 2019-08-12 NOTE — Progress Notes (Signed)
DATING AND VIABILITY SONOGRAM   Michelle Vincent is a 28 y.o. year old G1P0000 with LMP Patient's last menstrual period was 06/01/2019. which would correlate to  [redacted]w[redacted]d weeks gestation.  She has regular menstrual cycles.   She is here today for a confirmatory initial sonogram.    GESTATION: SINGLETON  FETAL ACTIVITY:          Heart rate        164 bpm          The fetus is active.   ADNEXA: The ovaries are normal.   GESTATIONAL AGE AND  BIOMETRICS:  Gestational criteria: Estimated Date of Delivery: 03/07/20 by early ultrasound now at [redacted]w[redacted]d  Previous Scans:1      CROWN RUMP LENGTH           cm         9-2 weeks                                                                               AVERAGE EGA(BY THIS SCAN): 9-2 weeks  WORKING EDD( early ultrasound ):  03/07/2020     TECHNICIAN COMMENTS:  Patient informed that the ultrasound is considered a limited obstetric ultrasound and is not intended to be a complete ultrasound exam. Patient also informed that the ultrasound is not being completed with the intent of assessing for fetal or placental anomalies or any pelvic abnormalities. Explained that the purpose of today's ultrasound is to assess for fetal heart rate. Patient acknowledges the purpose of the exam and the limitations of the study.    Kathrene Alu 08/12/2019 10:45 AM

## 2019-08-14 LAB — CULTURE, OB URINE

## 2019-08-14 LAB — URINE CULTURE, OB REFLEX

## 2019-08-16 ENCOUNTER — Encounter: Payer: Self-pay | Admitting: Advanced Practice Midwife

## 2019-08-20 LAB — SMN1 COPY NUMBER ANALYSIS (SMA CARRIER SCREENING)

## 2019-08-21 LAB — OBSTETRIC PANEL, INCLUDING HIV
Antibody Screen: NEGATIVE
Basophils Absolute: 0 10*3/uL (ref 0.0–0.2)
Basos: 1 %
EOS (ABSOLUTE): 0.2 10*3/uL (ref 0.0–0.4)
Eos: 4 %
HIV Screen 4th Generation wRfx: NONREACTIVE
Hematocrit: 35 % (ref 34.0–46.6)
Hemoglobin: 11.6 g/dL (ref 11.1–15.9)
Hepatitis B Surface Ag: NEGATIVE
Immature Grans (Abs): 0 10*3/uL (ref 0.0–0.1)
Immature Granulocytes: 0 %
Lymphocytes Absolute: 1.4 10*3/uL (ref 0.7–3.1)
Lymphs: 21 %
MCH: 30.5 pg (ref 26.6–33.0)
MCHC: 33.1 g/dL (ref 31.5–35.7)
MCV: 92 fL (ref 79–97)
Monocytes Absolute: 0.5 10*3/uL (ref 0.1–0.9)
Monocytes: 7 %
Neutrophils Absolute: 4.6 10*3/uL (ref 1.4–7.0)
Neutrophils: 67 %
Platelets: 255 10*3/uL (ref 150–450)
RBC: 3.8 x10E6/uL (ref 3.77–5.28)
RDW: 12.6 % (ref 11.7–15.4)
RPR Ser Ql: NONREACTIVE
Rh Factor: POSITIVE
Rubella Antibodies, IGG: 1.83 index (ref >0.99)
WBC: 6.9 10*3/uL (ref 3.4–10.8)

## 2019-08-21 LAB — CYSTIC FIBROSIS MUTATION 97: Interpretation: NOT DETECTED

## 2019-08-29 ENCOUNTER — Other Ambulatory Visit: Payer: Self-pay

## 2019-08-29 ENCOUNTER — Ambulatory Visit (INDEPENDENT_AMBULATORY_CARE_PROVIDER_SITE_OTHER): Payer: 59 | Admitting: Family Medicine

## 2019-08-29 VITALS — BP 108/60 | HR 90 | Wt 157.0 lb

## 2019-08-29 DIAGNOSIS — O210 Mild hyperemesis gravidarum: Secondary | ICD-10-CM

## 2019-08-29 DIAGNOSIS — O219 Vomiting of pregnancy, unspecified: Secondary | ICD-10-CM | POA: Insufficient documentation

## 2019-08-29 DIAGNOSIS — Z3A12 12 weeks gestation of pregnancy: Secondary | ICD-10-CM

## 2019-08-29 DIAGNOSIS — Z3400 Encounter for supervision of normal first pregnancy, unspecified trimester: Secondary | ICD-10-CM

## 2019-08-29 DIAGNOSIS — G2581 Restless legs syndrome: Secondary | ICD-10-CM

## 2019-08-29 MED ORDER — METOCLOPRAMIDE HCL 10 MG PO TABS: 10.0000 mg | ORAL_TABLET | Freq: Four times a day (QID) | ORAL | 2 refills | Status: DC | PRN

## 2019-08-29 MED ORDER — MAGNESIUM 30 MG PO TABS: 30.0000 mg | ORAL_TABLET | Freq: Two times a day (BID) | ORAL | 30 refills | Status: DC

## 2019-08-29 NOTE — Progress Notes (Signed)
   PRENATAL VISIT NOTE  Subjective:  Michelle Vincent is a 28 y.o. G1P0000 at 26w5dbeing seen today for ongoing prenatal care.  She is currently monitored for the following issues for this low-risk pregnancy and has Tachycardia; Supervision of normal first pregnancy, antepartum; Inappropriate sinus tachycardia; Allergic rhinitis; Chronic migraine without aura without status migrainosus, not intractable; Mild intermittent asthma without complication; Shift work sleep disorder; and Nausea/vomiting in pregnancy on their problem list.  Patient reports vomiting and restless leg syndrome.  Contractions: Not present.  .  Movement: Absent. Denies leaking of fluid.   The following portions of the patient's history were reviewed and updated as appropriate: allergies, current medications, past family history, past medical history, past social history, past surgical history and problem list.   Objective:   Vitals:   08/29/19 1021  BP: 108/60  Pulse: 90  Weight: 157 lb (71.2 kg)    Fetal Status: Fetal Heart Rate (bpm): 147   Movement: Absent     General:  Alert, oriented and cooperative. Patient is in no acute distress.  Skin: Skin is warm and dry. No rash noted.   Cardiovascular: Normal heart rate noted  Respiratory: Normal respiratory effort, no problems with respiration noted  Abdomen: Soft, gravid, appropriate for gestational age.  Pain/Pressure: Absent     Pelvic: Cervical exam deferred        Extremities: Normal range of motion.  Edema: Trace  Mental Status: Normal mood and affect. Normal behavior. Normal judgment and thought content.   Assessment and Plan:  Pregnancy: G1P0000 at 113w5d. Supervision of normal first pregnancy, antepartum FHT normal - Comp Met (CMET) - CBC - Genetic Screening  2. Hyperemesis gravidarum On B6 and unisom. Has zofran at home. Start reglan - had reaction, muscle spasm, with IV. Will try oral to see if she tolerates it.  3. Restless leg Check CMP,  CBC. Start magnesium.   Preterm labor symptoms and general obstetric precautions including but not limited to vaginal bleeding, contractions, leaking of fluid and fetal movement were reviewed in detail with the patient. Please refer to After Visit Summary for other counseling recommendations.   No follow-ups on file.  Future Appointments  Date Time Provider [place]11/16/2020  9:45 AM StTruett MainlandDO [facility] None    JaTruett MainlandDO

## 2019-08-29 NOTE — Progress Notes (Signed)
Patient complaining of restlessness. Patient stats she is still dealing with nausea and vomiting Michelle Alu RN

## 2019-08-30 LAB — CBC
Hematocrit: 34.4 % (ref 34.0–46.6)
Hemoglobin: 12 g/dL (ref 11.1–15.9)
MCH: 31.3 pg (ref 26.6–33.0)
MCHC: 34.9 g/dL (ref 31.5–35.7)
MCV: 90 fL (ref 79–97)
Platelets: 269 10*3/uL (ref 150–450)
RBC: 3.83 x10E6/uL (ref 3.77–5.28)
RDW: 13 % (ref 11.7–15.4)
WBC: 10.6 10*3/uL (ref 3.4–10.8)

## 2019-08-30 LAB — COMPREHENSIVE METABOLIC PANEL
ALT: 11 IU/L (ref 0–32)
AST: 14 IU/L (ref 0–40)
Albumin/Globulin Ratio: 1.9 (ref 1.2–2.2)
Albumin: 4.1 g/dL (ref 3.9–5.0)
Alkaline Phosphatase: 48 IU/L (ref 39–117)
BUN/Creatinine Ratio: 11 (ref 9–23)
BUN: 7 mg/dL (ref 6–20)
Bilirubin Total: 0.2 mg/dL (ref 0.0–1.2)
CO2: 21 mmol/L (ref 20–29)
Calcium: 9.2 mg/dL (ref 8.7–10.2)
Chloride: 103 mmol/L (ref 96–106)
Creatinine, Ser: 0.61 mg/dL (ref 0.57–1.00)
GFR calc Af Amer: 145 mL/min/{1.73_m2} (ref >59)
GFR calc non Af Amer: 126 mL/min/{1.73_m2} (ref >59)
Globulin, Total: 2.2 g/dL (ref 1.5–4.5)
Glucose: 90 mg/dL (ref 65–99)
Potassium: 4.8 mmol/L (ref 3.5–5.2)
Sodium: 135 mmol/L (ref 134–144)
Total Protein: 6.3 g/dL (ref 6.0–8.5)

## 2019-09-01 ENCOUNTER — Inpatient Hospital Stay (HOSPITAL_COMMUNITY)
Admission: AD | Admit: 2019-09-01 | Discharge: 2019-09-01 | Disposition: A | Discharge status: Home / Self Care | Payer: 59 | Attending: Obstetrics and Gynecology | Admitting: Obstetrics and Gynecology

## 2019-09-01 ENCOUNTER — Other Ambulatory Visit: Payer: Self-pay

## 2019-09-01 ENCOUNTER — Other Ambulatory Visit: Payer: Self-pay | Admitting: Family Medicine

## 2019-09-01 ENCOUNTER — Encounter (HOSPITAL_COMMUNITY): Payer: Self-pay

## 2019-09-01 DIAGNOSIS — O219 Vomiting of pregnancy, unspecified: Secondary | ICD-10-CM | POA: Insufficient documentation

## 2019-09-01 DIAGNOSIS — Z79899 Other long term (current) drug therapy: Secondary | ICD-10-CM | POA: Insufficient documentation

## 2019-09-01 DIAGNOSIS — H538 Other visual disturbances: Secondary | ICD-10-CM | POA: Insufficient documentation

## 2019-09-01 DIAGNOSIS — Z3A13 13 weeks gestation of pregnancy: Secondary | ICD-10-CM | POA: Diagnosis not present

## 2019-09-01 DIAGNOSIS — G43909 Migraine, unspecified, not intractable, without status migrainosus: Secondary | ICD-10-CM | POA: Insufficient documentation

## 2019-09-01 DIAGNOSIS — R519 Headache, unspecified: Secondary | ICD-10-CM

## 2019-09-01 DIAGNOSIS — O26892 Other specified pregnancy related conditions, second trimester: Secondary | ICD-10-CM | POA: Insufficient documentation

## 2019-09-01 DIAGNOSIS — R42 Dizziness and giddiness: Secondary | ICD-10-CM | POA: Diagnosis not present

## 2019-09-01 DIAGNOSIS — Z8759 Personal history of other complications of pregnancy, childbirth and the puerperium: Secondary | ICD-10-CM

## 2019-09-01 DIAGNOSIS — Z8669 Personal history of other diseases of the nervous system and sense organs: Secondary | ICD-10-CM

## 2019-09-01 LAB — URINALYSIS, ROUTINE W REFLEX MICROSCOPIC
Bacteria, UA: NONE SEEN
Bilirubin Urine: NEGATIVE
Glucose, UA: NEGATIVE mg/dL
Hgb urine dipstick: NEGATIVE
Ketones, ur: 15 mg/dL — AB
Leukocytes,Ua: NEGATIVE
Nitrite: NEGATIVE
Protein, ur: NEGATIVE mg/dL
Specific Gravity, Urine: >1.03 — ABNORMAL HIGH (ref 1.005–1.030)
pH: 5.5 (ref 5.0–8.0)

## 2019-09-01 MED ORDER — DIPHENHYDRAMINE HCL 50 MG/ML IJ SOLN
25.0000 mg | Freq: Once | INTRAMUSCULAR | Status: AC
  Administered 2019-09-01: 15:00:00 25 mg via INTRAVENOUS
  Filled 2019-09-01: qty 1

## 2019-09-01 MED ORDER — PROCHLORPERAZINE EDISYLATE 10 MG/2ML IJ SOLN
10.0000 mg | Freq: Once | INTRAMUSCULAR | Status: AC
  Administered 2019-09-01: 17:00:00 10 mg via INTRAVENOUS
  Filled 2019-09-01: qty 2

## 2019-09-01 MED ORDER — METOCLOPRAMIDE HCL 5 MG/ML IJ SOLN: 10.0000 mg | Freq: Once | INTRAMUSCULAR | Status: DC

## 2019-09-01 MED ORDER — LACTATED RINGERS IV BOLUS
1000.0000 mL | Freq: Once | INTRAVENOUS | Status: AC
  Administered 2019-09-01: 1000 mL via INTRAVENOUS

## 2019-09-01 MED ORDER — DEXAMETHASONE SODIUM PHOSPHATE 10 MG/ML IJ SOLN
10.0000 mg | Freq: Once | INTRAMUSCULAR | Status: AC
  Administered 2019-09-01: 10 mg via INTRAVENOUS
  Filled 2019-09-01: qty 1

## 2019-09-01 MED ORDER — MORPHINE SULFATE (PF) 4 MG/ML IV SOLN
2.0000 mg | Freq: Once | INTRAVENOUS | Status: AC
  Administered 2019-09-01: 16:00:00 2 mg via INTRAVENOUS
  Filled 2019-09-01: qty 1

## 2019-09-01 MED ORDER — PROMETHAZINE HCL 25 MG/ML IJ SOLN
25.0000 mg | Freq: Once | INTRAMUSCULAR | Status: AC
  Administered 2019-09-01: 16:00:00 25 mg via INTRAVENOUS
  Filled 2019-09-01: qty 1

## 2019-09-01 MED ORDER — LACTATED RINGERS IV BOLUS
500.0000 mL | Freq: Once | INTRAVENOUS | Status: AC
  Administered 2019-09-01: 500 mL via INTRAVENOUS

## 2019-09-01 NOTE — Discharge Instructions (Signed)
Nausea & Vomiting  Have saltine crackers or pretzels by your bed and eat a few bites before you raise your head out of bed in the morning  Eat small frequent meals throughout the day instead of large meals  Drink plenty of fluids throughout the day to stay hydrated, just don't drink a lot of fluids with your meals.  This can make your stomach fill up faster making you feel sick  Do not brush your teeth right after you eat  Products with real ginger are good for nausea, like ginger ale and ginger hard candy Make sure it says made with real ginger!  Sucking on sour candy like lemon heads is also good for nausea  If your prenatal vitamins make you nauseated, take them at night so you will sleep through the nausea  Sea Bands  If you feel like you need medicine for the nausea & vomiting please let us know  If you are unable to keep any fluids or food down please let us know    Migraine Headache A migraine headache is an intense, throbbing pain on one side or both sides of the head. Migraine headaches may also cause other symptoms, such as nausea, vomiting, and sensitivity to light and noise. A migraine headache can last from 4 hours to 3 days. Talk with your doctor about what things may bring on (trigger) your migraine headaches. What are the causes? The exact cause of this condition is not known. However, a migraine may be caused when nerves in the brain become irritated and release chemicals that cause inflammation of blood vessels. This inflammation causes pain. This condition may be triggered or caused by:  Drinking alcohol.  Smoking.  Taking medicines, such as: ? Medicine used to treat chest pain (nitroglycerin). ? Birth control pills. ? Estrogen. ? Certain blood pressure medicines.  Eating or drinking products that contain nitrates, glutamate, aspartame, or tyramine. Aged cheeses, chocolate, or caffeine may also be triggers.  Doing physical activity. Other things that may  trigger a migraine headache include:  Menstruation.  Pregnancy.  Hunger.  Stress.  Lack of sleep or too much sleep.  Weather changes.  Fatigue. What increases the risk? The following factors may make you more likely to experience migraine headaches:  Being a certain age. This condition is more common in people who are 38-25 years old.  Being female.  Having a family history of migraine headaches.  Being Caucasian.  Having a mental health condition, such as depression or anxiety.  Being obese. What are the signs or symptoms? The main symptom of this condition is pulsating or throbbing pain. This pain may:  Happen in any area of the head, such as on one side or both sides.  Interfere with daily activities.  Get worse with physical activity.  Get worse with exposure to bright lights or loud noises. Other symptoms may include:  Nausea.  Vomiting.  Dizziness.  General sensitivity to bright lights, loud noises, or smells. Before you get a migraine headache, you may get warning signs (an aura). An aura may include:  Seeing flashing lights or having blind spots.  Seeing bright spots, halos, or zigzag lines.  Having tunnel vision or blurred vision.  Having numbness or a tingling feeling.  Having trouble talking.  Having muscle weakness. Some people have symptoms after a migraine headache (postdromal phase), such as:  Feeling tired.  Difficulty concentrating. How is this diagnosed? A migraine headache can be diagnosed based on:  Your symptoms.  A physical exam.  Tests, such as: ? CT scan or an MRI of the head. These imaging tests can help rule out other causes of headaches. ? Taking fluid from the spine (lumbar puncture) and analyzing it (cerebrospinal fluid analysis, or CSF analysis). How is this treated? This condition may be treated with medicines that:  Relieve pain.  Relieve nausea.  Prevent migraine headaches. Treatment for this  condition may also include:  Acupuncture.  Lifestyle changes like avoiding foods that trigger migraine headaches.  Biofeedback.  Cognitive behavioral therapy. Follow these instructions at home: Medicines  Take over-the-counter and prescription medicines only as told by your health care provider.  Ask your health care provider if the medicine prescribed to you: ? Requires you to avoid driving or using heavy machinery. ? Can cause constipation. You may need to take these actions to prevent or treat constipation:  Drink enough fluid to keep your urine pale yellow.  Take over-the-counter or prescription medicines.  Eat foods that are high in fiber, such as beans, whole grains, and fresh fruits and vegetables.  Limit foods that are high in fat and processed sugars, such as fried or sweet foods. Lifestyle  Do not drink alcohol.  Do not use any products that contain nicotine or tobacco, such as cigarettes, e-cigarettes, and chewing tobacco. If you need help quitting, ask your health care provider.  Get at least 8 hours of sleep every night.  Find ways to manage stress, such as meditation, deep breathing, or yoga. General instructions      Keep a journal to find out what may trigger your migraine headaches. For example, write down: ? What you eat and drink. ? How much sleep you get. ? Any change to your diet or medicines.  If you have a migraine headache: ? Avoid things that make your symptoms worse, such as bright lights. ? It may help to lie down in a dark, quiet room. ? Do not drive or use heavy machinery. ? Ask your health care provider what activities are safe for you while you are experiencing symptoms.  Keep all follow-up visits as told by your health care provider. This is important. Contact a health care provider if:  You develop symptoms that are different or more severe than your usual migraine headache symptoms.  You have more than 15 headache days in one  month. Get help right away if:  Your migraine headache becomes severe.  Your migraine headache lasts longer than 72 hours.  You have a fever.  You have a stiff neck.  You have vision loss.  Your muscles feel weak or like you cannot control them.  You start to lose your balance often.  You have trouble walking.  You faint.  You have a seizure. Summary  A migraine headache is an intense, throbbing pain on one side or both sides of the head. Migraines may also cause other symptoms, such as nausea, vomiting, and sensitivity to light and noise.  This condition may be treated with medicines and lifestyle changes. You may also need to avoid certain things that trigger a migraine headache.  Keep a journal to find out what may trigger your migraine headaches.  Contact your health care provider if you have more than 15 headache days in a month or you develop symptoms that are different or more severe than your usual migraine headache symptoms. This information is not intended to replace advice given to you by your health care provider. Make sure you discuss any  questions you have with your health care provider. Document Released: 10/23/2005 Document Revised: 02/14/2019 Document Reviewed: 12/05/2018 Elsevier Patient Education  2020 [employer]

## 2019-09-01 NOTE — MAU Provider Note (Addendum)
History     CSN: [id number]  Arrival date and time: 09/01/19 1329   First Provider Initiated Contact with Patient 09/01/19 1450      Chief Complaint  Patient presents with  . Headache  . Emesis   HPI Michelle Vincent is a 28 y.o. G1P0000 at [redacted]w[redacted]d who present today with vomiting and migraines. She reports vomiting has been ongoing for 4 days. She has only been able to eat saltines, a banana and some water in small amounts. Patient has taken Zofran and Phenergan without relief. Initially had improvement with Compazine and Diglegis, but states they no longer provide relief.  Her migraine started two days ago. Notes some associated blurry vision and dizziness. She has a history of migraines but has not taken any of her migraine medications since becoming pregnant. She tried tylenol and ibuprofen without any improvement.  Denies vaginal bleeding, vaginal discharge, abdominal pain, diarrhea or constipation.    OB History    Gravida  1   Para  0   Term  0   Preterm  0   AB  0   Living  0     SAB  0   TAB  0   Ectopic  0   Multiple  0   Live Births  0           Past Medical History:  Diagnosis Date  . HA (headache)   . Occipital neuralgia   . Sinus tachycardia   . SVT (supraventricular tachycardia) (HCC)     Past Surgical History:  Procedure Laterality Date  . shoulder Right   . TONSILLECTOMY AND ADENOIDECTOMY      Family History  Problem Relation Age of Onset  . Hypertension Mother   . Transient ischemic attack Mother   . Diabetes Brother        Type 1  . Breast cancer Maternal Aunt   . Cancer Maternal Aunt     Social History   Tobacco Use  . Smoking status: Never Smoker  . Smokeless tobacco: Never Used  Substance Use Topics  . Alcohol use: Not Currently    Alcohol/week: 1.0 standard drinks    Types: 1 Glasses of wine per week    Frequency: Never  . Drug use: Never    Allergies:  Allergies  Allergen Reactions  . Nitrous Oxide  Anaphylaxis  . Peach Flavor Anaphylaxis  . Shellfish Allergy Anaphylaxis  . Adhesive [Tape] Hives, Itching and Other (See Comments)    blisters  . Reglan [Metoclopramide]     Muscle spasms    Medications Prior to Admission  Medication Sig Dispense Refill Last Dose  . doxylamine, Sleep, (UNISOM) 25 MG tablet Take 1 tablet (25 mg total) by mouth at bedtime as needed. 30 tablet 0   . Doxylamine-Pyridoxine (DICLEGIS) 10-10 MG TBEC Take 2 tablets by mouth at bedtime. If symptoms persist, add one tablet in the morning and one in the afternoon (Patient not taking: Reported on 08/12/2019) 100 tablet 5   . ferrous sulfate 325 (65 FE) MG tablet Take 325 mg by mouth 3 (three) times daily.     . Magnesium Gluconate 550 MG TABS TAKE 1 TABLET BY MOUTH 2 TIMES DAILY 60 tablet 30   . methocarbamol (ROBAXIN) 750 MG tablet Take 750 mg by mouth 3 (three) times daily as needed (for headache).      . metoCLOPramide (REGLAN) 10 MG tablet Take 1 tablet (10 mg total) by mouth 4 (four) times daily as needed for  nausea or vomiting. 30 tablet 2   . Prenatal Vit-Fe Fumarate-FA (PRENATAL MULTIVITAMIN) TABS tablet Take 1 tablet by mouth daily at 12 noon.     . prochlorperazine (COMPAZINE) 10 MG tablet Take 1 tablet (10 mg total) by mouth 2 (two) times daily as needed for nausea or vomiting. (Patient not taking: Reported on 08/12/2019) 20 tablet 0   . prochlorperazine (COMPAZINE) 10 MG tablet Take 1 tablet (10 mg total) by mouth every 8 (eight) hours as needed for nausea or vomiting. 30 tablet 0   . propranolol (INDERAL) 10 MG tablet TAKE 1 TABLET BY MOUTH DAILY AS NEEDED (Patient taking differently: Take 10 mg by mouth daily as needed (svt). ) 30 tablet 10   . pyridOXINE (VITAMIN B-6) 100 MG tablet Take 1 tablet (100 mg total) by mouth daily. 30 tablet 2   . zolpidem (AMBIEN) 10 MG tablet Take 10 mg by mouth at bedtime.     Marland Kitchen. zolpidem (AMBIEN) 10 MG tablet Take by mouth.       Review of Systems  Constitutional: Negative  for chills, fatigue and fever.  HENT: Negative for congestion, rhinorrhea and sore throat.   Eyes: Positive for photophobia and visual disturbance (blurry vision).  Gastrointestinal: Positive for nausea and vomiting. Negative for abdominal pain, constipation and diarrhea.  Genitourinary: Negative for vaginal bleeding and vaginal discharge.  Neurological: Positive for dizziness and headaches.   Physical Exam   Blood pressure 115/60, pulse 74, temperature 97.9 F (36.6 C), temperature source Oral, resp. rate 18, last menstrual period 06/01/2019, SpO2 98 %.  Physical Exam  Constitutional: She is oriented to person, place, and time. She appears well-developed and well-nourished.  HENT:  Head: Normocephalic and atraumatic.  Mouth/Throat: Oropharynx is clear and moist.  Eyes: Pupils are equal, round, and reactive to light. Conjunctivae and EOM are normal.  Neck: Normal range of motion. Neck supple.  Cardiovascular: Normal rate, regular rhythm and normal heart sounds.  Respiratory: Effort normal and breath sounds normal. No respiratory distress.  GI: Soft. She exhibits no distension. There is no abdominal tenderness. There is no rebound and no guarding.  Musculoskeletal: Normal range of motion.        General: No edema.  Neurological: She is alert and oriented to person, place, and time. No cranial nerve deficit. She exhibits normal muscle tone.  Skin: Skin is warm and dry.  Psychiatric: She has a normal mood and affect. Her behavior is normal. Judgment and thought content normal.   FHR by Doppler: 148   Results for orders placed or performed during the hospital encounter of 09/01/19 (from the past 24 hour(s))  Urinalysis, Routine w reflex microscopic     Status: Abnormal   Collection Time: 09/01/19  2:49 PM  Result Value Ref Range   Color, Urine YELLOW YELLOW   APPearance CLEAR CLEAR   Specific Gravity, Urine >1.030 (H) 1.005 - 1.030   pH 5.5 5.0 - 8.0   Glucose, UA NEGATIVE NEGATIVE  mg/dL   Hgb urine dipstick NEGATIVE NEGATIVE   Bilirubin Urine NEGATIVE NEGATIVE   Ketones, ur 15 (A) NEGATIVE mg/dL   Protein, ur NEGATIVE NEGATIVE mg/dL   Nitrite NEGATIVE NEGATIVE   Leukocytes,Ua NEGATIVE NEGATIVE   RBC / HPF 0-5 0 - 5 RBC/hpf   WBC, UA 0-5 0 - 5 WBC/hpf   Bacteria, UA NONE SEEN NONE SEEN   Squamous Epithelial / LPF 0-5 0 - 5   Mucus PRESENT      MAU Course  Procedures  Orders Placed This Encounter  Procedures  . Urinalysis, Routine w reflex microscopic   MDM - Started migraine cocktail of Phenergan, Benadryl and Dexamethasone and 1000 cc LR bolus - HA unrelieved with migraine cocktail - given repeat 500 LR bolus, morphine and compazine - patient's headache decreased to a 4 out of 10 with further treatment  Assessment and Plan   1. Nausea/vomiting in pregnancy   2. H/O migraine during pregnancy   3. Acute nonintractable headache, unspecified headache type   4. [redacted] weeks gestation of pregnancy    Plan - Patients headache decreased after above treatments. Advised to continue at home medications as needed for nausea and vomiting. Nausea/vomiting relief measures in pregnancy reviewed.  - all questions answered. Educated that migraines can worsen during pregnancy and advised to monitor for changes in headaches and/or elevated BPs.  - patient discharged home in stable condition -could consider trial of Fioricet in the future as does not appear patient has an active Rx for this migraine medication    Alexa A Kimker 09/01/2019, 3:08 PM   OB FELLOW PA STUDENT NOTE ATTESTATION  I confirm that I have verified the information documented in the PA student's note and that I have also personally performed the physical exam and all medical decision making activities. I have edited the note appropriately to reflect my history/exam/[name].   Marcy Siren, D.O. OB Fellow  09/01/2019, 6:18 PM

## 2019-09-01 NOTE — MAU Note (Signed)
Pt presents to MAU with c/o vomiting that started x 4 days ago and a severe headache that started x 2 days ago. She has not been able to keep anything down. She is taking Zofran, Phenergan, Reglan, Compazine, and Diclegis and has not had any relief.

## 2019-09-04 NOTE — Telephone Encounter (Signed)
Needs letter for work: able to resume normal activities.

## 2019-09-07 ENCOUNTER — Other Ambulatory Visit: Payer: Self-pay

## 2019-09-09 ENCOUNTER — Encounter: Payer: 59 | Admitting: Advanced Practice Midwife

## 2019-09-09 ENCOUNTER — Encounter (HOSPITAL_COMMUNITY): Payer: Self-pay

## 2019-09-09 ENCOUNTER — Telehealth: Payer: Self-pay

## 2019-09-09 ENCOUNTER — Other Ambulatory Visit: Payer: Self-pay

## 2019-09-09 ENCOUNTER — Inpatient Hospital Stay (HOSPITAL_COMMUNITY)
Admission: AD | Admit: 2019-09-09 | Discharge: 2019-09-10 | Disposition: A | Discharge status: Home / Self Care | Payer: 59 | Attending: Family Medicine | Admitting: Family Medicine

## 2019-09-09 DIAGNOSIS — O99611 Diseases of the digestive system complicating pregnancy, first trimester: Secondary | ICD-10-CM | POA: Diagnosis not present

## 2019-09-09 DIAGNOSIS — I471 Supraventricular tachycardia: Secondary | ICD-10-CM | POA: Insufficient documentation

## 2019-09-09 DIAGNOSIS — Z888 Allergy status to other drugs, medicaments and biological substances status: Secondary | ICD-10-CM | POA: Diagnosis not present

## 2019-09-09 DIAGNOSIS — O99282 Endocrine, nutritional and metabolic diseases complicating pregnancy, second trimester: Secondary | ICD-10-CM | POA: Insufficient documentation

## 2019-09-09 DIAGNOSIS — O219 Vomiting of pregnancy, unspecified: Secondary | ICD-10-CM | POA: Diagnosis present

## 2019-09-09 DIAGNOSIS — Z79899 Other long term (current) drug therapy: Secondary | ICD-10-CM | POA: Diagnosis not present

## 2019-09-09 DIAGNOSIS — R197 Diarrhea, unspecified: Secondary | ICD-10-CM

## 2019-09-09 DIAGNOSIS — Z3A14 14 weeks gestation of pregnancy: Secondary | ICD-10-CM | POA: Diagnosis not present

## 2019-09-09 DIAGNOSIS — O218 Other vomiting complicating pregnancy: Secondary | ICD-10-CM | POA: Insufficient documentation

## 2019-09-09 DIAGNOSIS — K529 Noninfective gastroenteritis and colitis, unspecified: Secondary | ICD-10-CM | POA: Insufficient documentation

## 2019-09-09 DIAGNOSIS — O99412 Diseases of the circulatory system complicating pregnancy, second trimester: Secondary | ICD-10-CM | POA: Insufficient documentation

## 2019-09-09 DIAGNOSIS — Z91018 Allergy to other foods: Secondary | ICD-10-CM | POA: Diagnosis not present

## 2019-09-09 DIAGNOSIS — Z91013 Allergy to seafood: Secondary | ICD-10-CM | POA: Diagnosis not present

## 2019-09-09 DIAGNOSIS — E860 Dehydration: Secondary | ICD-10-CM | POA: Insufficient documentation

## 2019-09-09 LAB — CBC WITH DIFFERENTIAL/PLATELET
Abs Immature Granulocytes: 0.05 10*3/uL (ref 0.00–0.07)
Basophils Absolute: 0 10*3/uL (ref 0.0–0.1)
Basophils Relative: 0 %
Eosinophils Absolute: 0.1 10*3/uL (ref 0.0–0.5)
Eosinophils Relative: 1 %
HCT: 36.9 % (ref 36.0–46.0)
Hemoglobin: 12.9 g/dL (ref 12.0–15.0)
Immature Granulocytes: 0 %
Lymphocytes Relative: 14 %
Lymphs Abs: 1.5 10*3/uL (ref 0.7–4.0)
MCH: 31.5 pg (ref 26.0–34.0)
MCHC: 35 g/dL (ref 30.0–36.0)
MCV: 90 fL (ref 80.0–100.0)
Monocytes Absolute: 0.7 10*3/uL (ref 0.1–1.0)
Monocytes Relative: 6 %
Neutro Abs: 8.8 10*3/uL — ABNORMAL HIGH (ref 1.7–7.7)
Neutrophils Relative %: 79 %
Platelets: 268 10*3/uL (ref 150–400)
RBC: 4.1 MIL/uL (ref 3.87–5.11)
RDW: 12.6 % (ref 11.5–15.5)
WBC: 11.2 10*3/uL — ABNORMAL HIGH (ref 4.0–10.5)
nRBC: 0 % (ref 0.0–0.2)

## 2019-09-09 LAB — BASIC METABOLIC PANEL
Anion gap: 11 (ref 5–15)
BUN: 9 mg/dL (ref 6–20)
CO2: 18 mmol/L — ABNORMAL LOW (ref 22–32)
Calcium: 9.2 mg/dL (ref 8.9–10.3)
Chloride: 106 mmol/L (ref 98–111)
Creatinine, Ser: 0.73 mg/dL (ref 0.44–1.00)
GFR calc Af Amer: >60 mL/min (ref >60)
GFR calc non Af Amer: >60 mL/min (ref >60)
Glucose, Bld: 97 mg/dL (ref 70–99)
Potassium: 4.1 mmol/L (ref 3.5–5.1)
Sodium: 135 mmol/L (ref 135–145)

## 2019-09-09 LAB — URINALYSIS, ROUTINE W REFLEX MICROSCOPIC
Bilirubin Urine: NEGATIVE
Glucose, UA: NEGATIVE mg/dL
Hgb urine dipstick: NEGATIVE
Ketones, ur: 80 mg/dL — AB
Leukocytes,Ua: NEGATIVE
Nitrite: NEGATIVE
Protein, ur: 30 mg/dL — AB
Specific Gravity, Urine: 1.027 (ref 1.005–1.030)
pH: 5 (ref 5.0–8.0)

## 2019-09-09 MED ORDER — PROMETHAZINE HCL 25 MG/ML IJ SOLN
12.5000 mg | Freq: Once | INTRAMUSCULAR | Status: AC
  Administered 2019-09-09: 12.5 mg via INTRAVENOUS
  Filled 2019-09-09: qty 1

## 2019-09-09 MED ORDER — ESCITALOPRAM OXALATE 10 MG PO TABS: 10.0000 mg | ORAL_TABLET | Freq: Every day | ORAL | 3 refills | Status: DC

## 2019-09-09 MED ORDER — DIPHENHYDRAMINE HCL 50 MG/ML IJ SOLN: 50.0000 mg | Freq: Once | INTRAMUSCULAR | Status: DC

## 2019-09-09 MED ORDER — SCOPOLAMINE 1 MG/3DAYS TD PT72: 1.0000 | MEDICATED_PATCH | TRANSDERMAL | Status: DC

## 2019-09-09 MED ORDER — SCOPOLAMINE 1 MG/3DAYS TD PT72
1.0000 | MEDICATED_PATCH | Freq: Once | TRANSDERMAL | Status: DC
  Administered 2019-09-09: 1.5 mg via TRANSDERMAL
  Filled 2019-09-09: qty 1

## 2019-09-09 MED ORDER — LACTATED RINGERS IV SOLN
INTRAVENOUS | Status: DC
  Administered 2019-09-09: 22:00:00 via INTRAVENOUS

## 2019-09-09 MED ORDER — DEXTROSE IN LACTATED RINGERS 5 % IV SOLN
INTRAVENOUS | Status: DC
  Administered 2019-09-10: via INTRAVENOUS

## 2019-09-09 MED ORDER — DIPHENHYDRAMINE HCL 50 MG/ML IJ SOLN
25.0000 mg | Freq: Once | INTRAMUSCULAR | Status: AC
  Administered 2019-09-09: 25 mg via INTRAVENOUS
  Filled 2019-09-09: qty 1

## 2019-09-09 MED ORDER — FAMOTIDINE 20 MG PO TABS: 20.0000 mg | ORAL_TABLET | Freq: Two times a day (BID) | ORAL | 3 refills | Status: DC

## 2019-09-09 NOTE — Addendum Note (Signed)
Addended by: Truett Mainland on: 09/09/2019 01:31 PM   Modules accepted: Orders

## 2019-09-09 NOTE — MAU Provider Note (Signed)
Chief Complaint: Emesis   First Provider Initiated Contact with Patient 09/09/19 2156        SUBJECTIVE HPI: Michelle Vincent is a 28 y.o. G1P0000 at [redacted]w[redacted]d by LMP who presents to maternity admissions reporting vomiting since earlier today.  Has been having problems with this since early pregnancy, but did have a week with good relief.  .Has several meds at home but has not taken any of them since this morning.  She denies vaginal bleeding, vaginal itching/burning, urinary symptoms, h/a, dizziness, or fever/chills.    Emesis  This is a recurrent problem. The current episode started today. The problem has been unchanged. There has been no fever. Associated symptoms include dizziness and weight loss. Pertinent negatives include no abdominal pain, chills, diarrhea or fever. Treatments tried: several meds but has not taken anything since morning.    RN Note: Pt states that she has been vomiting since last night.  Pt states her chest hurts a little bit and thinks that it is from the vomiting, but notes that she does have SVT.  Denies vaginal bleeding.Reports dizziness and exhaustion.   Past Medical History:  Diagnosis Date  . HA (headache)   . Occipital neuralgia   . Sinus tachycardia   . SVT (supraventricular tachycardia) (HCC)    Past Surgical History:  Procedure Laterality Date  . shoulder Right   . TONSILLECTOMY AND ADENOIDECTOMY     Social History   Socioeconomic History  . Marital status: Single    Spouse name: Not on file  . Number of children: Not on file  . Years of education: Not on file  . Highest education level: Not on file  Occupational History  . Not on file  Social Needs  . Financial resource strain: Not on file  . Food insecurity    Worry: Not on file    Inability: Not on file  . Transportation needs    Medical: Not on file    Non-medical: Not on file  Tobacco Use  . Smoking status: Never Smoker  . Smokeless tobacco: Never Used  Substance and Sexual  Activity  . Alcohol use: Not Currently    Alcohol/week: 1.0 standard drinks    Types: 1 Glasses of wine per week    Frequency: Never  . Drug use: Never  . Sexual activity: Yes  Lifestyle  . Physical activity    Days per week: Not on file    Minutes per session: Not on file  . Stress: Not on file  Relationships  . Social Musician on phone: Not on file    Gets together: Not on file    Attends religious service: Not on file    Active member of club or organization: Not on file    Attends meetings of clubs or organizations: Not on file    Relationship status: Not on file  . Intimate partner violence    Fear of current or ex partner: Not on file    Emotionally abused: Not on file    Physically abused: Not on file    Forced sexual activity: Not on file  Other Topics Concern  . Not on file  Social History Narrative   EMT   No current facility-administered medications on file prior to encounter.    Current Outpatient Medications on File Prior to Encounter  Medication Sig Dispense Refill  . escitalopram (LEXAPRO) 10 MG tablet Take 1 tablet (10 mg total) by mouth daily. 30 tablet 3  . ferrous  sulfate 325 (65 FE) MG tablet Take 325 mg by mouth 3 (three) times daily.    . Magnesium Gluconate 550 MG TABS TAKE 1 TABLET BY MOUTH 2 TIMES DAILY 60 tablet 30  . methocarbamol (ROBAXIN) 750 MG tablet Take 750 mg by mouth 3 (three) times daily as needed (for headache).     . metoCLOPramide (REGLAN) 10 MG tablet Take 1 tablet (10 mg total) by mouth 4 (four) times daily as needed for nausea or vomiting. 30 tablet 2  . Prenatal Vit-Fe Fumarate-FA (PRENATAL MULTIVITAMIN) TABS tablet Take 1 tablet by mouth daily at 12 noon.    . prochlorperazine (COMPAZINE) 10 MG tablet Take 1 tablet (10 mg total) by mouth every 8 (eight) hours as needed for nausea or vomiting. 30 tablet 0  . propranolol (INDERAL) 10 MG tablet TAKE 1 TABLET BY MOUTH DAILY AS NEEDED (Patient taking differently: Take 10 mg  by mouth daily as needed (svt). ) 30 tablet 10  . zolpidem (AMBIEN) 10 MG tablet Take 10 mg by mouth at bedtime.    Marland Kitchen doxylamine, Sleep, (UNISOM) 25 MG tablet Take 1 tablet (25 mg total) by mouth at bedtime as needed. 30 tablet 0  . Doxylamine-Pyridoxine (DICLEGIS) 10-10 MG TBEC Take 2 tablets by mouth at bedtime. If symptoms persist, add one tablet in the morning and one in the afternoon (Patient not taking: Reported on 08/12/2019) 100 tablet 5  . famotidine (PEPCID) 20 MG tablet Take 1 tablet (20 mg total) by mouth 2 (two) times daily. 60 tablet 3  . prochlorperazine (COMPAZINE) 10 MG tablet Take 1 tablet (10 mg total) by mouth 2 (two) times daily as needed for nausea or vomiting. (Patient not taking: Reported on 08/12/2019) 20 tablet 0  . pyridOXINE (VITAMIN B-6) 100 MG tablet Take 1 tablet (100 mg total) by mouth daily. 30 tablet 2  . zolpidem (AMBIEN) 10 MG tablet Take by mouth.     Allergies  Allergen Reactions  . Nitrous Oxide Anaphylaxis  . Peach Flavor Anaphylaxis  . Shellfish Allergy Anaphylaxis  . Adhesive [Tape] Hives, Itching and Other (See Comments)    blisters  . Reglan [Metoclopramide]     Muscle spasms. Pt takes med at home    I have reviewed patient's Past Medical Hx, Surgical Hx, Family Hx, Social Hx, medications and allergies.   ROS:  Review of Systems  Constitutional: Positive for weight loss. Negative for chills and fever.  Gastrointestinal: Positive for vomiting. Negative for abdominal pain and diarrhea.  Neurological: Positive for dizziness.   Review of Systems  Other systems negative   Physical Exam  Physical Exam Patient Vitals for the past 24 hrs:  BP Temp Pulse Resp SpO2 Weight  09/09/19 2140 106/74 98.1 F (36.7 C) 90 18 100 % -  09/09/19 2132 - - - - - 69.8 kg    Constitutional: Well-developed, female in no acute distress. Does appear to have malaise Cardiovascular: normal rate Respiratory: normal effort GI: Abd soft, diffusely mildly tender.  Pos BS x 4 MS: Extremities nontender, no edema, normal ROM Neurologic: Alert and oriented x 4.  GU: Neg CVAT.  PELVIC EXAM: deferred  FHT 138 by doppler  LAB RESULTS Results for orders placed or performed during the hospital encounter of 09/09/19 (from the past 24 hour(s))  Urinalysis, Routine w reflex microscopic     Status: Abnormal   Collection Time: 09/09/19  9:45 PM  Result Value Ref Range   Color, Urine YELLOW YELLOW   APPearance HAZY (A)  CLEAR   Specific Gravity, Urine 1.027 1.005 - 1.030   pH 5.0 5.0 - 8.0   Glucose, UA NEGATIVE NEGATIVE mg/dL   Hgb urine dipstick NEGATIVE NEGATIVE   Bilirubin Urine NEGATIVE NEGATIVE   Ketones, ur 80 (A) NEGATIVE mg/dL   Protein, ur 30 (A) NEGATIVE mg/dL   Nitrite NEGATIVE NEGATIVE   Leukocytes,Ua NEGATIVE NEGATIVE   RBC / HPF 0-5 0 - 5 RBC/hpf   WBC, UA 6-10 0 - 5 WBC/hpf   Bacteria, UA RARE (A) NONE SEEN   Squamous Epithelial / LPF 11-20 0 - 5   Mucus PRESENT   CBC with Differential/Platelet     Status: Abnormal   Collection Time: 09/09/19 10:28 PM  Result Value Ref Range   WBC 11.2 (H) 4.0 - 10.5 K/uL   RBC 4.10 3.87 - 5.11 MIL/uL   Hemoglobin 12.9 12.0 - 15.0 g/dL   HCT 16.136.9 09.636.0 - 04.546.0 %   MCV 90.0 80.0 - 100.0 fL   MCH 31.5 26.0 - 34.0 pg   MCHC 35.0 30.0 - 36.0 g/dL   RDW 40.912.6 81.111.5 - 91.415.5 %   Platelets 268 150 - 400 K/uL   nRBC 0.0 0.0 - 0.2 %   Neutrophils Relative % 79 %   Neutro Abs 8.8 (H) 1.7 - 7.7 K/uL   Lymphocytes Relative 14 %   Lymphs Abs 1.5 0.7 - 4.0 K/uL   Monocytes Relative 6 %   Monocytes Absolute 0.7 0.1 - 1.0 K/uL   Eosinophils Relative 1 %   Eosinophils Absolute 0.1 0.0 - 0.5 K/uL   Basophils Relative 0 %   Basophils Absolute 0.0 0.0 - 0.1 K/uL   Immature Granulocytes 0 %   Abs Immature Granulocytes 0.05 0.00 - 0.07 K/uL  Basic metabolic panel     Status: Abnormal   Collection Time: 09/09/19 10:28 PM  Result Value Ref Range   Sodium 135 135 - 145 mmol/L   Potassium 4.1 3.5 - 5.1 mmol/L    Chloride 106 98 - 111 mmol/L   CO2 18 (L) 22 - 32 mmol/L   Glucose, Bld 97 70 - 99 mg/dL   BUN 9 6 - 20 mg/dL   Creatinine, Ser 7.820.73 0.44 - 1.00 mg/dL   Calcium 9.2 8.9 - 95.610.3 mg/dL   GFR calc non Af Amer >60 >60 mL/min   GFR calc Af Amer >60 >60 mL/min   Anion gap 11 5 - 15     A/Positive/-- (10/06 0929)  IMAGING No results found.  MAU Management/MDM: Ordered IV hydration and labs to check for metabolic disruption Potassium is normal as is Hgb.  No significant leukocytosis Urine showed ketones and concentrated urine Two liters of IV fluids were given .   Gave scopolamine patch and Zofran  Antiemetic given. Had some jitteriness of one arm, so Benadryl given for possible dystonic reaction, though the jitters could also have been shivering.  Felt better after two liters and did void.  We tried PO challenge at that point with success.  Unable to tolerate PO intake Then patient developed diarrhea.  So now I suspect this is gastroenteritis instead of simply hyperemesis Gave a third bag of IVF, with Decadron and Pepcid, and a second dose of Benadryl for jitters/dystonia.. She started to feel better after third liter Still has nausea, but we discussed this will likely persist until virus has run its course  ASSESSMENT Single intrauterine pregnancy at [id number] Nausea and vomiting of pregnancy Moderate dehydration Diarrhea Gastroenteritis  PLAN Discharge  home Has multiple antiemetics at home for prn use SLow advancement of PO intake as tolerated Note written for out of work until 24 hours with no symptoms  Pt stable at time of discharge. Encouraged to return here or to other Urgent Care/ED if she develops worsening of symptoms, increase in pain, fever, or other concerning symptoms.    Hansel Feinstein CNM, MSN Certified Nurse-Midwife 09/09/2019  9:56 PM

## 2019-09-09 NOTE — Telephone Encounter (Signed)
Patient called and made aware that panorama came back low risk. Patient also made aware of what she is having. Kathrene Alu RN

## 2019-09-09 NOTE — MAU Note (Signed)
Pt states that she has been vomiting since last night.   Pt states her chest hurts a little bit and thinks that it is from the vomiting, but notes that she does have SVT.   Denies vaginal bleeding.  Reports dizziness and exhaustion.

## 2019-09-10 MED ORDER — FAMOTIDINE IN NACL 20-0.9 MG/50ML-% IV SOLN
20.0000 mg | Freq: Once | INTRAVENOUS | Status: AC
  Administered 2019-09-10: 20 mg via INTRAVENOUS
  Filled 2019-09-10: qty 50

## 2019-09-10 MED ORDER — ONDANSETRON HCL 4 MG/2ML IJ SOLN
4.0000 mg | Freq: Four times a day (QID) | INTRAMUSCULAR | Status: DC | PRN
  Administered 2019-09-10: 4 mg via INTRAVENOUS
  Filled 2019-09-10: qty 2

## 2019-09-10 MED ORDER — DIPHENHYDRAMINE HCL 50 MG/ML IJ SOLN
25.0000 mg | Freq: Once | INTRAMUSCULAR | Status: AC
  Administered 2019-09-10: 25 mg via INTRAVENOUS
  Filled 2019-09-10: qty 1

## 2019-09-10 MED ORDER — DEXAMETHASONE SODIUM PHOSPHATE 10 MG/ML IJ SOLN
10.0000 mg | Freq: Once | INTRAMUSCULAR | Status: AC
  Administered 2019-09-10: 10 mg via INTRAVENOUS
  Filled 2019-09-10: qty 1

## 2019-09-10 MED ORDER — SODIUM CHLORIDE 0.9 % IV SOLN
INTRAVENOUS | Status: DC
  Administered 2019-09-10 (×2): via INTRAVENOUS

## 2019-09-10 NOTE — Discharge Instructions (Signed)
Second Trimester of Pregnancy The second trimester is from week 14 through week 27 (months 4 through 6). The second trimester is often a time when you feel your best. Your body has adjusted to being pregnant, and you begin to feel better physically. Usually, morning sickness has lessened or quit completely, you may have more energy, and you may have an increase in appetite. The second trimester is also a time when the fetus is growing rapidly. At the end of the sixth month, the fetus is about 9 inches long and weighs about 1 pounds. You will likely begin to feel the baby move (quickening) between 16 and 20 weeks of pregnancy. Body changes during your second trimester Your body continues to go through many changes during your second trimester. The changes vary from woman to woman.  Your weight will continue to increase. You will notice your lower abdomen bulging out.  You may begin to get stretch marks on your hips, abdomen, and breasts.  You may develop headaches that can be relieved by medicines. The medicines should be approved by your health care provider.  You may urinate more often because the fetus is pressing on your bladder.  You may develop or continue to have heartburn as a result of your pregnancy.  You may develop constipation because certain hormones are causing the muscles that push waste through your intestines to slow down.  You may develop hemorrhoids or swollen, bulging veins (varicose veins).  You may have back pain. This is caused by: ? Weight gain. ? Pregnancy hormones that are relaxing the joints in your pelvis. ? A shift in weight and the muscles that support your balance.  Your breasts will continue to grow and they will continue to become tender.  Your gums may bleed and may be sensitive to brushing and flossing.  Dark spots or blotches (chloasma, mask of pregnancy) may develop on your face. This will likely fade after the baby is born.  A dark line from your  belly button to the pubic area (linea nigra) may appear. This will likely fade after the baby is born.  You may have changes in your hair. These can include thickening of your hair, rapid growth, and changes in texture. Some women also have hair loss during or after pregnancy, or hair that feels dry or thin. Your hair will most likely return to normal after your baby is born. What to expect at prenatal visits During a routine prenatal visit:  You will be weighed to make sure you and the fetus are growing normally.  Your blood pressure will be taken.  Your abdomen will be measured to track your baby's growth.  The fetal heartbeat will be listened to.  Any test results from the previous visit will be discussed. Your health care provider may ask you:  How you are feeling.  If you are feeling the baby move.  If you have had any abnormal symptoms, such as leaking fluid, bleeding, severe headaches, or abdominal cramping.  If you are using any tobacco products, including cigarettes, chewing tobacco, and electronic cigarettes.  If you have any questions. Other tests that may be performed during your second trimester include:  Blood tests that check for: ? Low iron levels (anemia). ? High blood sugar that affects pregnant women (gestational diabetes) between 35 and 28 weeks. ? Rh antibodies. This is to check for a protein on red blood cells (Rh factor).  Urine tests to check for infections, diabetes, or protein in the  urine.  An ultrasound to confirm the proper growth and development of the baby.  An amniocentesis to check for possible genetic problems.  Fetal screens for spina bifida and Down syndrome.  HIV (human immunodeficiency virus) testing. Routine prenatal testing includes screening for HIV, unless you choose not to have this test. Follow these instructions at home: Medicines  Follow your health care provider's instructions regarding medicine use. Specific medicines may be  either safe or unsafe to take during pregnancy.  Take a prenatal vitamin that contains at least 600 micrograms (mcg) of folic acid.  If you develop constipation, try taking a stool softener if your health care provider approves. Eating and drinking   Eat a balanced diet that includes fresh fruits and vegetables, whole grains, good sources of protein such as meat, eggs, or tofu, and low-fat dairy. Your health care provider will help you determine the amount of weight gain that is right for you.  Avoid raw meat and uncooked cheese. These carry germs that can cause birth defects in the baby.  If you have low calcium intake from food, talk to your health care provider about whether you should take a daily calcium supplement.  Limit foods that are high in fat and processed sugars, such as fried and sweet foods.  To prevent constipation: ? Drink enough fluid to keep your urine clear or pale yellow. ? Eat foods that are high in fiber, such as fresh fruits and vegetables, whole grains, and beans. Activity  Exercise only as directed by your health care provider. Most women can continue their usual exercise routine during pregnancy. Try to exercise for 30 minutes at least 5 days a week. Stop exercising if you experience uterine contractions.  Avoid heavy lifting, wear low heel shoes, and practice good posture.  A sexual relationship may be continued unless your health care provider directs you otherwise. Relieving pain and discomfort  Wear a good support bra to prevent discomfort from breast tenderness.  Take warm sitz baths to soothe any pain or discomfort caused by hemorrhoids. Use hemorrhoid cream if your health care provider approves.  Rest with your legs elevated if you have leg cramps or low back pain.  If you develop varicose veins, wear support hose. Elevate your feet for 15 minutes, 3-4 times a day. Limit salt in your diet. Prenatal Care  Write down your questions. Take them to  your prenatal visits.  Keep all your prenatal visits as told by your health care provider. This is important. Safety  Wear your seat belt at all times when driving.  Make a list of emergency phone numbers, including numbers for family, friends, the hospital, and police and fire departments. General instructions  Ask your health care provider for a referral to a local prenatal education class. Begin classes no later than the beginning of month 6 of your pregnancy.  Ask for help if you have counseling or nutritional needs during pregnancy. Your health care provider can offer advice or refer you to specialists for help with various needs.  Do not use hot tubs, steam rooms, or saunas.  Do not douche or use tampons or scented sanitary pads.  Do not cross your legs for long periods of time.  Avoid cat litter boxes and soil used by cats. These carry germs that can cause birth defects in the baby and possibly loss of the fetus by miscarriage or stillbirth.  Avoid all smoking, herbs, alcohol, and unprescribed drugs. Chemicals in these products can affect the formation  and growth of the baby.  Do not use any products that contain nicotine or tobacco, such as cigarettes and e-cigarettes. If you need help quitting, ask your health care provider.  Visit your dentist if you have not gone yet during your pregnancy. Use a soft toothbrush to brush your teeth and be gentle when you floss. Contact a health care provider if:  You have dizziness.  You have mild pelvic cramps, pelvic pressure, or nagging pain in the abdominal area.  You have persistent nausea, vomiting, or diarrhea.  You have a bad smelling vaginal discharge.  You have pain when you urinate. Get help right away if:  You have a fever.  You are leaking fluid from your vagina.  You have spotting or bleeding from your vagina.  You have severe abdominal cramping or pain.  You have rapid weight gain or weight loss.  You have  shortness of breath with chest pain.  You notice sudden or extreme swelling of your face, hands, ankles, feet, or legs.  You have not felt your baby move in over an hour.  You have severe headaches that do not go away when you take medicine.  You have vision changes. Summary  The second trimester is from week 14 through week 27 (months 4 through 6). It is also a time when the fetus is growing rapidly.  Your body goes through many changes during pregnancy. The changes vary from woman to woman.  Avoid all smoking, herbs, alcohol, and unprescribed drugs. These chemicals affect the formation and growth your baby.  Do not use any tobacco products, such as cigarettes, chewing tobacco, and e-cigarettes. If you need help quitting, ask your health care provider.  Contact your health care provider if you have any questions. Keep all prenatal visits as told by your health care provider. This is important. This information is not intended to replace advice given to you by your health care provider. Make sure you discuss any questions you have with your health care provider. Document Released: 10/17/2001 Document Revised: 02/14/2019 Document Reviewed: 11/28/2016 Elsevier Patient Education  2020 [employer] Viral Gastroenteritis, Adult  Viral gastroenteritis is also known as the stomach flu. This condition may affect your stomach, small intestine, and large intestine. It can cause sudden watery diarrhea, fever, and vomiting. This condition is caused by many different viruses. These viruses can be passed from person to person very easily (are contagious). Diarrhea and vomiting can make you feel weak and cause you to become dehydrated. You may not be able to keep fluids down. Dehydration can make you tired and thirsty, cause you to have a dry mouth, and decrease how often you urinate. It is important to replace the fluids that you lose from diarrhea and vomiting. What are the causes? Gastroenteritis  is caused by many viruses, including rotavirus and norovirus. Norovirus is the most common cause in adults. You can get sick after being exposed to the viruses from other people. You can also get sick by:  Eating food, drinking water, or touching a surface contaminated with one of these viruses.  Sharing utensils or other personal items with an infected person. What increases the risk? You are more likely to develop this condition if you:  Have a weak body defense system (immune system).  Live with one or more children who are younger than 58 years old.  Live in a nursing home.  Travel on cruise ships. What are the signs or symptoms? Symptoms of this condition start suddenly  1-3 days after exposure to a virus. Symptoms may last for a few days or for as long as a week. Common symptoms include watery diarrhea and vomiting. Other symptoms include:  Fever.  Headache.  Fatigue.  Pain in the abdomen.  Chills.  Weakness.  Nausea.  Muscle aches.  Loss of appetite. How is this diagnosed? This condition is diagnosed with a medical history and physical exam. You may also have a stool test to check for viruses or other infections. How is this treated? This condition typically goes away on its own. The focus of treatment is to prevent dehydration and restore lost fluids (rehydration). This condition may be treated with:  An oral rehydration solution (ORS) to replace important salts and minerals (electrolytes) in your body. Take this if told by your health care provider. This is a drink that is sold at pharmacies and retail stores.  Medicines to help with your symptoms.  Probiotic supplements to reduce symptoms of diarrhea.  Fluids given through an IV, if dehydration is severe. Older adults and people with other diseases or a weak immune system are at higher risk for dehydration. Follow these instructions at home:  Eating and drinking   Take an ORS as told by your health care  provider.  Drink clear fluids in small amounts as you are able. Clear fluids include: ? Water. ? Ice chips. ? Diluted fruit juice. ? Low-calorie sports drinks.  Drink enough fluid to keep your urine pale yellow.  Eat small amounts of healthy foods every 3-4 hours as you are able. This may include whole grains, fruits, vegetables, lean meats, and yogurt.  Avoid fluids that contain a lot of sugar or caffeine, such as energy drinks, sports drinks, and soda.  Avoid spicy or fatty foods.  Avoid alcohol. General instructions  Wash your hands often, especially after having diarrhea or vomiting. If soap and water are not available, use hand sanitizer.  Make sure that all people in your household wash their hands well and often.  Take over-the-counter and prescription medicines only as told by your health care provider.  Rest at home while you recover.  Watch your condition for any changes.  Take a warm bath to relieve any burning or pain from frequent diarrhea episodes.  Keep all follow-up visits as told by your health care provider. This is important. Contact a health care provider if you:  Cannot keep fluids down.  Have symptoms that get worse.  Have new symptoms.  Feel light-headed or dizzy.  Have muscle cramps. Get help right away if you:  Have chest pain.  Feel extremely weak or you faint.  See blood in your vomit.  Have vomit that looks like coffee grounds.  Have bloody or black stools or stools that look like tar.  Have a severe headache, a stiff neck, or both.  Have a rash.  Have severe pain, cramping, or bloating in your abdomen.  Have trouble breathing or you are breathing very quickly.  Have a fast heartbeat.  Have skin that feels cold and clammy.  Feel confused.  Have pain when you urinate.  Have signs of dehydration, such as: ? Dark urine, very little urine, or no urine. ? Cracked lips. ? Dry mouth. ? Sunken  eyes. ? Sleepiness. ? Weakness. Summary  Viral gastroenteritis is also known as the stomach flu. It can cause sudden watery diarrhea, fever, and vomiting.  This condition can be passed from person to person very easily (is contagious).  Take an  ORS if told by your health care provider. This is a drink that is sold at pharmacies and retail stores.  Wash your hands often, especially after having diarrhea or vomiting. If soap and water are not available, use hand sanitizer. This information is not intended to replace advice given to you by your health care provider. Make sure you discuss any questions you have with your health care provider. Document Released: 10/23/2005 Document Revised: 08/28/2018 Document Reviewed: 08/28/2018 Elsevier Patient Education  2020 [employer].

## 2019-09-11 ENCOUNTER — Inpatient Hospital Stay (HOSPITAL_COMMUNITY)
Admission: AD | Admit: 2019-09-11 | Discharge: 2019-09-11 | Disposition: A | Discharge status: Home / Self Care | Payer: 59 | Attending: Obstetrics & Gynecology | Admitting: Obstetrics & Gynecology

## 2019-09-11 ENCOUNTER — Other Ambulatory Visit: Payer: Self-pay

## 2019-09-11 ENCOUNTER — Encounter (HOSPITAL_COMMUNITY): Payer: Self-pay

## 2019-09-11 ENCOUNTER — Telehealth: Payer: Self-pay

## 2019-09-11 DIAGNOSIS — O219 Vomiting of pregnancy, unspecified: Secondary | ICD-10-CM | POA: Diagnosis not present

## 2019-09-11 DIAGNOSIS — O99512 Diseases of the respiratory system complicating pregnancy, second trimester: Secondary | ICD-10-CM | POA: Diagnosis not present

## 2019-09-11 DIAGNOSIS — F419 Anxiety disorder, unspecified: Secondary | ICD-10-CM | POA: Insufficient documentation

## 2019-09-11 DIAGNOSIS — O99342 Other mental disorders complicating pregnancy, second trimester: Secondary | ICD-10-CM | POA: Diagnosis not present

## 2019-09-11 DIAGNOSIS — Z3A14 14 weeks gestation of pregnancy: Secondary | ICD-10-CM | POA: Diagnosis not present

## 2019-09-11 DIAGNOSIS — Z79899 Other long term (current) drug therapy: Secondary | ICD-10-CM | POA: Diagnosis not present

## 2019-09-11 DIAGNOSIS — J45909 Unspecified asthma, uncomplicated: Secondary | ICD-10-CM | POA: Diagnosis not present

## 2019-09-11 DIAGNOSIS — O210 Mild hyperemesis gravidarum: Secondary | ICD-10-CM | POA: Insufficient documentation

## 2019-09-11 HISTORY — DX: Anemia, unspecified: D64.9

## 2019-09-11 HISTORY — DX: Unspecified asthma, uncomplicated: J45.909

## 2019-09-11 HISTORY — DX: Anxiety disorder, unspecified: F41.9

## 2019-09-11 LAB — RAPID URINE DRUG SCREEN, HOSP PERFORMED
Amphetamines: NOT DETECTED
Barbiturates: NOT DETECTED
Benzodiazepines: NOT DETECTED
Cocaine: NOT DETECTED
Opiates: NOT DETECTED
Tetrahydrocannabinol: NOT DETECTED

## 2019-09-11 LAB — URINALYSIS, ROUTINE W REFLEX MICROSCOPIC
Bilirubin Urine: NEGATIVE
Glucose, UA: NEGATIVE mg/dL
Hgb urine dipstick: NEGATIVE
Ketones, ur: NEGATIVE mg/dL
Leukocytes,Ua: NEGATIVE
Nitrite: NEGATIVE
Protein, ur: NEGATIVE mg/dL
Specific Gravity, Urine: 1.013 (ref 1.005–1.030)
pH: 7 (ref 5.0–8.0)

## 2019-09-11 MED ORDER — PROCHLORPERAZINE EDISYLATE 10 MG/2ML IJ SOLN
10.0000 mg | Freq: Once | INTRAMUSCULAR | Status: AC
  Administered 2019-09-11: 18:00:00 10 mg via INTRAVENOUS
  Filled 2019-09-11: qty 2

## 2019-09-11 MED ORDER — LACTATED RINGERS IV BOLUS
250.0000 mL | Freq: Once | INTRAVENOUS | Status: AC
  Administered 2019-09-11: 17:00:00 250 mL via INTRAVENOUS

## 2019-09-11 MED ORDER — FAMOTIDINE IN NACL 20-0.9 MG/50ML-% IV SOLN
20.0000 mg | Freq: Once | INTRAVENOUS | Status: AC
  Administered 2019-09-11: 17:00:00 20 mg via INTRAVENOUS
  Filled 2019-09-11: qty 50

## 2019-09-11 MED ORDER — SODIUM CHLORIDE 0.9 % IV SOLN
25.0000 mg | Freq: Once | INTRAVENOUS | Status: AC
  Administered 2019-09-11: 16:00:00 25 mg via INTRAVENOUS
  Filled 2019-09-11: qty 1

## 2019-09-11 MED ORDER — DIPHENHYDRAMINE HCL 50 MG/ML IJ SOLN
25.0000 mg | Freq: Once | INTRAMUSCULAR | Status: AC
  Administered 2019-09-11: 25 mg via INTRAVENOUS
  Filled 2019-09-11: qty 1

## 2019-09-11 MED ORDER — METHYLPREDNISOLONE SODIUM SUCC 125 MG IJ SOLR
48.0000 mg | Freq: Once | INTRAMUSCULAR | Status: AC
  Administered 2019-09-11: 18:00:00 48 mg via INTRAVENOUS
  Filled 2019-09-11: qty 2

## 2019-09-11 NOTE — Progress Notes (Signed)
Lab called to inquire about UDS. Lead lab tech states that results should be available in approx 20 mins.

## 2019-09-11 NOTE — MAU Provider Note (Addendum)
History     CSN: [id number]  Arrival date and time: 09/11/19 1252   First Provider Initiated Contact with Patient 09/11/19 1439      Chief Complaint  Patient presents with  . Morning Sickness   HPI Michelle Vincent is a 28 y.o. G1P0000 at [id number] who presents to MAU with chief complaint of recurrent nausea and vomiting. This is a recurrent problem that includes previous visit to MAU 09/09/19. Patient states she had difficulty with nausea and vomiting in early pregnancy, then was fine for two weeks until her symptoms returned at dinnertime on 09/08/2019.   She has home prescriptions for multiple antiemetics but has only taken Zofran early this morning.Patient verbalizes that she has attempted many different combinations of foods including 3-4 saltines crackers at a time and medications without success. She states she was told she "might get admitted" if her vomiting continued.   She denies vaginal bleeding, abdominal pain, abnormal vaginal discharge, fever, falls, or recent illness.   OB History    Gravida  1   Para  0   Term  0   Preterm  0   AB  0   Living  0     SAB  0   TAB  0   Ectopic  0   Multiple  0   Live Births  0           Past Medical History:  Diagnosis Date  . Anemia   . Anxiety    takes lexapro  . Asthma    prn inhaler use  . HA (headache)   . Occipital neuralgia   . Sinus tachycardia   . SVT (supraventricular tachycardia) (HCC)     Past Surgical History:  Procedure Laterality Date  . shoulder Right   . TONSILLECTOMY AND ADENOIDECTOMY      Family History  Problem Relation Age of Onset  . Hypertension Mother   . Transient ischemic attack Mother   . Diabetes Brother        Type 1  . Breast cancer Maternal Aunt   . Cancer Maternal Aunt     Social History   Tobacco Use  . Smoking status: Never Smoker  . Smokeless tobacco: Never Used  Substance Use Topics  . Alcohol use: Not Currently    Alcohol/week: 1.0 standard drinks     Types: 1 Glasses of wine per week    Frequency: Never  . Drug use: Never    Allergies:  Allergies  Allergen Reactions  . Nitrous Oxide Anaphylaxis  . Peach Flavor Anaphylaxis  . Shellfish Allergy Anaphylaxis  . Adhesive [Tape] Hives, Itching and Other (See Comments)    blisters  . Reglan [Metoclopramide]     Muscle spasms. Pt takes med at home    Medications Prior to Admission  Medication Sig Dispense Refill Last Dose  . doxylamine, Sleep, (UNISOM) 25 MG tablet Take 1 tablet (25 mg total) by mouth at bedtime as needed. 30 tablet 0   . Doxylamine-Pyridoxine (DICLEGIS) 10-10 MG TBEC Take 2 tablets by mouth at bedtime. If symptoms persist, add one tablet in the morning and one in the afternoon (Patient not taking: Reported on 08/12/2019) 100 tablet 5   . escitalopram (LEXAPRO) 10 MG tablet Take 1 tablet (10 mg total) by mouth daily. 30 tablet 3   . famotidine (PEPCID) 20 MG tablet Take 1 tablet (20 mg total) by mouth 2 (two) times daily. 60 tablet 3   . ferrous sulfate 325 (65 FE) MG  tablet Take 325 mg by mouth 3 (three) times daily.     . Magnesium Gluconate 550 MG TABS TAKE 1 TABLET BY MOUTH 2 TIMES DAILY 60 tablet 30   . methocarbamol (ROBAXIN) 750 MG tablet Take 750 mg by mouth 3 (three) times daily as needed (for headache).      . metoCLOPramide (REGLAN) 10 MG tablet Take 1 tablet (10 mg total) by mouth 4 (four) times daily as needed for nausea or vomiting. 30 tablet 2   . Prenatal Vit-Fe Fumarate-FA (PRENATAL MULTIVITAMIN) TABS tablet Take 1 tablet by mouth daily at 12 noon.     . prochlorperazine (COMPAZINE) 10 MG tablet Take 1 tablet (10 mg total) by mouth 2 (two) times daily as needed for nausea or vomiting. (Patient not taking: Reported on 08/12/2019) 20 tablet 0   . prochlorperazine (COMPAZINE) 10 MG tablet Take 1 tablet (10 mg total) by mouth every 8 (eight) hours as needed for nausea or vomiting. 30 tablet 0   . propranolol (INDERAL) 10 MG tablet TAKE 1 TABLET BY MOUTH DAILY  AS NEEDED (Patient taking differently: Take 10 mg by mouth daily as needed (svt). ) 30 tablet 10   . pyridOXINE (VITAMIN B-6) 100 MG tablet Take 1 tablet (100 mg total) by mouth daily. 30 tablet 2   . zolpidem (AMBIEN) 10 MG tablet Take 10 mg by mouth at bedtime.     Marland Kitchen zolpidem (AMBIEN) 10 MG tablet Take by mouth.       Review of Systems  Constitutional: Positive for fatigue. Negative for chills and fever.  Respiratory: Negative for shortness of breath.   Gastrointestinal: Positive for nausea and vomiting. Negative for abdominal pain, constipation and diarrhea.  Genitourinary: Negative for difficulty urinating, dysuria, vaginal bleeding, vaginal discharge and vaginal pain.  Musculoskeletal: Negative for back pain.  Neurological: Positive for weakness. Negative for dizziness and syncope.  All other systems reviewed and are negative.  Physical Exam   Blood pressure 104/68, pulse 78, temperature 97.8 F (36.6 C), resp. rate 16, height 5\' 7"  (1.702 m), weight 70.7 kg, last menstrual period 06/01/2019, SpO2 99 %.  Physical Exam  Nursing note and vitals reviewed. Constitutional: She is oriented to person, place, and time. She appears well-developed and well-nourished.  Cardiovascular: Normal rate.  Respiratory: Effort normal and breath sounds normal.  GI: Soft. Bowel sounds are normal. She exhibits no distension. There is no abdominal tenderness. There is no rebound, no guarding and no CVA tenderness.  Musculoskeletal: Normal range of motion.  Neurological: She is alert and oriented to person, place, and time.  Skin: Skin is warm and dry.  Psychiatric: She has a normal mood and affect. Her behavior is normal. Judgment and thought content normal.    MAU Course/MDM  Procedures   --Notes from 11/03 suggest initial concern for hyperemesis then gastroenteritis  --At bedside at 1620 to assess impact of Phenergan bag. Patient's FOB has come to MAU since my previous visit to bedside and is  eating hot wings at bedside. Discussed extremely strong smell which is detectable from hallway outside room. Reviewed smell triggers, close proximity to strong flavors as possible triggers.  Patient's husband asked about another midwife who has seen them on previous visits to MAU. I confirmed that we have same credentials but gave them the option of continuing under my care or signing out AMA and returning when that CNM works tonight. After a few minutes of private conversation they agreed to stay under my care.  --At bedside  at 1800 to discuss new game plan for antiemetics vs many other medications previously prescribed. Advised to use Diclegis or Phenergan as primary interventions given positive response. Advised continued [place] of Pepcid and Compazine as written. Patient states she is consistently able to tolerate Ambien as written, ok to continue PRN. Discussed very slow, bland diet with simple sugar solids as first attempted food, avoid smell triggers, consider sips of tepid/room temperature water once able to tolerate an entire saltine cracker. --Patient reports nausea throughout her almost 6 hours in MAU today but has not experienced emesis at any time --Weight is stable, no ketonuria. Normal CMP during MAU visit 09/09/19  Patient Vitals for the past 24 hrs:  BP Temp Pulse Resp SpO2 Height Weight  09/11/19 1828 104/65 - 68 16 - - -  09/11/19 1439 104/68 97.8 F (36.6 C) 78 16 99 % - -  09/11/19 1353 - - - - - 5\' 7"  (1.702 m) 70.7 kg   Results for orders placed or performed during the hospital encounter of 09/11/19 (from the past 24 hour(s))  Urinalysis, Routine w reflex microscopic     Status: Abnormal   Collection Time: 09/11/19  1:13 PM  Result Value Ref Range   Color, Urine YELLOW YELLOW   APPearance HAZY (A) CLEAR   Specific Gravity, Urine 1.013 1.005 - 1.030   pH 7.0 5.0 - 8.0   Glucose, UA NEGATIVE NEGATIVE mg/dL   Hgb urine dipstick NEGATIVE NEGATIVE   Bilirubin Urine NEGATIVE  NEGATIVE   Ketones, ur NEGATIVE NEGATIVE mg/dL   Protein, ur NEGATIVE NEGATIVE mg/dL   Nitrite NEGATIVE NEGATIVE   Leukocytes,Ua NEGATIVE NEGATIVE  Urine rapid drug screen (hosp performed)     Status: None   Collection Time: 09/11/19  1:45 PM  Result Value Ref Range   Opiates NONE DETECTED NONE DETECTED   Cocaine NONE DETECTED NONE DETECTED   Benzodiazepines NONE DETECTED NONE DETECTED   Amphetamines NONE DETECTED NONE DETECTED   Tetrahydrocannabinol NONE DETECTED NONE DETECTED   Barbiturates NONE DETECTED NONE DETECTED   Meds ordered this encounter  Medications  . promethazine (PHENERGAN) 25 mg in sodium chloride 0.9 % 1,000 mL infusion  . diphenhydrAMINE (BENADRYL) injection 25 mg  . prochlorperazine (COMPAZINE) injection 10 mg  . famotidine (PEPCID) IVPB 20 mg premix  . lactated ringers bolus 250 mL  . methylPREDNISolone sodium succinate (SOLU-MEDROL) 125 mg/2 mL injection 48 mg    First dose IV for hyperemesis gravidarum taper  . diphenhydrAMINE (BENADRYL) injection 25 mg   Assessment and Plan  --29 y.o. G1P0000 at [redacted]w[redacted]d  --FHT 138 by Doppler --N/V in early pregnancy with intermittent adherence to prescribed medications --Revised medication regimen as above --New rx steroid taper. Heavy emphasis on disciplined diet modification, removal of environmental triggers --Discharge home in stable condition  F/U: --OB 09/22/19 [facility]  09/24/19, CNM 09/11/2019, 7:01 PM

## 2019-09-11 NOTE — Telephone Encounter (Signed)
Pt called the office stating she is vomiting and not able to keep anything down. Pt was seen in MAU on 09/09/19 and states her symptoms are still the same. Pt advised to go to [facility]. Understanding was voiced.  Mirtha Jain l Rashard Ryle, CMA

## 2019-09-11 NOTE — MAU Note (Signed)
Pt states she was here "the other night" for excessive vomiting.  Pt states she was still vomiting when she was d/c.  States today she has been vomiting since 0830 this am. Pt states she also has noticed constant left sided cramping since arrival to MAU.  No VB or LOF.  States 1 episode of diarrhea noted yesterday, denies fever.

## 2019-09-11 NOTE — Progress Notes (Signed)
Lab called re: UDS. Lab tech stated to send the lab requisition & they will run it.  Lab requisition sent to lab via pneumatic tube station.

## 2019-09-11 NOTE — Progress Notes (Signed)
Pharmacy called re: ordered compazine. States they will send it soon.

## 2019-09-11 NOTE — Discharge Instructions (Signed)
Nausea/Vomiting in Pregnancy Nausea and vomiting usually occur during the first half (the first 20 weeks) of pregnancy. It often goes away once a woman is in her second half of pregnancy. However, sometimes hyperemesis continues through an entire pregnancy. What are the causes? The cause of this condition is not known. It may be related to changes in chemicals (hormones) in the body during pregnancy, such as the high level of pregnancy hormone (human chorionic gonadotropin) or the increase in the female sex hormone (estrogen). What are the signs or symptoms? Symptoms of this condition include:  Nausea that does not go away.  Vomiting that does not allow you to keep any food down.  Weight loss.  Body fluid loss (dehydration).  Having no desire to eat, or not liking food that you have previously enjoyed. How is this diagnosed? This condition may be diagnosed based on:  A physical exam.  Your medical history.  Your symptoms.  Blood tests.  Urine tests. How is this treated? This condition is managed by controlling symptoms. This may include:  Following an eating plan. This can help lessen nausea and vomiting.  Taking prescription medicines. An eating plan and medicines are often used together to help control symptoms. If medicines do not help relieve nausea and vomiting, you may need to receive fluids through an IV at the hospital. Follow these instructions at home: Eating and drinking   Avoid the following: ? Drinking fluids with meals. Try not to drink anything during the 30 minutes before and after your meals. ? Drinking more than 1 cup of fluid at a time. ? Eating foods that trigger your symptoms. These may include spicy foods, coffee, high-fat foods, very sweet foods, and acidic foods. ? Skipping meals. Nausea can be more intense on an empty stomach. If you cannot tolerate food, do not force it. Try sucking on ice chips or other frozen items and make up for missed calories  later. ? Lying down within 2 hours after eating. ? Being exposed to environmental triggers. These may include food smells, smoky rooms, closed spaces, rooms with strong smells, warm or humid places, overly loud and noisy rooms, and rooms with motion or flickering lights. Try eating meals in a well-ventilated area that is free of strong smells. ? Quick and sudden changes in your movement. ? Taking iron pills and multivitamins that contain iron. If you take prescription iron pills, do not stop taking them unless your health care provider approves. ? Preparing food. The smell of food can spoil your appetite or trigger nausea.  To help relieve your symptoms: ? Listen to your body. Everyone is different and has different preferences. Find what works best for you. ? Eat and drink slowly. ? Eat 5-6 small meals daily instead of 3 large meals. Eating small meals and snacks can help you avoid an empty stomach. ? In the morning, before getting out of bed, eat a couple of crackers to avoid moving around on an empty stomach. ? Try eating starchy foods as these are usually tolerated well. Examples include cereal, toast, bread, potatoes, pasta, rice, and pretzels. ? Include at least 1 serving of protein with your meals and snacks. Protein options include lean meats, poultry, seafood, beans, nuts, nut butters, eggs, cheese, and yogurt. ? Try eating a protein-rich snack before bed. Examples of a protein-rick snack include cheese and crackers or a peanut butter sandwich made with 1 slice of whole-wheat bread and 1 tsp (5 g) of peanut butter. ? Eat or  suck on things that have ginger in them. It may help relieve nausea. Add  tsp ground ginger to hot tea or choose ginger tea. ? Try drinking 100% fruit juice or an electrolyte drink. An electrolyte drink contains sodium, potassium, and chloride. ? Drink fluids that are cold, clear, and carbonated or sour. Examples include lemonade, ginger ale, lemon-lime soda, ice water,  and sparkling water. ? Brush your teeth or use a mouth rinse after meals. ? Talk with your health care provider about starting a supplement of vitamin B6. General instructions  Take over-the-counter and prescription medicines only as told by your health care provider.  Follow instructions from your health care provider about eating or drinking restrictions.  Continue to take your prenatal vitamins as told by your health care provider. If you are having trouble taking your prenatal vitamins, talk with your health care provider about different options.  Keep all follow-up and pre-birth (prenatal) visits as told by your health care provider. This is important. Contact a health care provider if:  You have pain in your abdomen.  You have a severe headache.  You have vision problems.  You are losing weight.  You feel weak or dizzy. Get help right away if:  You cannot drink fluids without vomiting.  You vomit blood.  You have constant nausea and vomiting.  You are very weak.  You faint.  You have a fever and your symptoms suddenly get worse. Summary  Making some changes to your eating habits may help relieve nausea and vomiting.  This condition may be managed with medicine.  If medicines do not help relieve nausea and vomiting, you may need to receive fluids through an IV at the hospital. This information is not intended to replace advice given to you by your health care provider. Make sure you discuss any questions you have with your health care provider. Document Released: 10/23/2005 Document Revised: 11/12/2017 Document Reviewed: 06/21/2016 Elsevier Patient Education  2020 [employer].

## 2019-09-12 ENCOUNTER — Other Ambulatory Visit: Payer: Self-pay | Admitting: Advanced Practice Midwife

## 2019-09-12 MED ORDER — METHYLPREDNISOLONE 8 MG PO TABS: ORAL_TABLET | ORAL | 0 refills | Status: DC

## 2019-09-15 ENCOUNTER — Inpatient Hospital Stay (HOSPITAL_COMMUNITY)
Admission: AD | Admit: 2019-09-15 | Discharge: 2019-09-15 | Disposition: A | Discharge status: Home / Self Care | Payer: 59 | Attending: Obstetrics and Gynecology | Admitting: Obstetrics and Gynecology

## 2019-09-15 ENCOUNTER — Encounter (HOSPITAL_COMMUNITY): Payer: Self-pay

## 2019-09-15 ENCOUNTER — Other Ambulatory Visit: Payer: Self-pay

## 2019-09-15 ENCOUNTER — Telehealth: Payer: Self-pay

## 2019-09-15 DIAGNOSIS — Z833 Family history of diabetes mellitus: Secondary | ICD-10-CM | POA: Diagnosis not present

## 2019-09-15 DIAGNOSIS — J45909 Unspecified asthma, uncomplicated: Secondary | ICD-10-CM | POA: Diagnosis not present

## 2019-09-15 DIAGNOSIS — O219 Vomiting of pregnancy, unspecified: Secondary | ICD-10-CM | POA: Diagnosis present

## 2019-09-15 DIAGNOSIS — Z823 Family history of stroke: Secondary | ICD-10-CM | POA: Diagnosis not present

## 2019-09-15 DIAGNOSIS — G43711 Chronic migraine without aura, intractable, with status migrainosus: Secondary | ICD-10-CM | POA: Insufficient documentation

## 2019-09-15 DIAGNOSIS — Z91013 Allergy to seafood: Secondary | ICD-10-CM | POA: Diagnosis not present

## 2019-09-15 DIAGNOSIS — I471 Supraventricular tachycardia: Secondary | ICD-10-CM | POA: Diagnosis not present

## 2019-09-15 DIAGNOSIS — Z3A15 15 weeks gestation of pregnancy: Secondary | ICD-10-CM | POA: Insufficient documentation

## 2019-09-15 DIAGNOSIS — O99512 Diseases of the respiratory system complicating pregnancy, second trimester: Secondary | ICD-10-CM | POA: Insufficient documentation

## 2019-09-15 DIAGNOSIS — Z8249 Family history of ischemic heart disease and other diseases of the circulatory system: Secondary | ICD-10-CM | POA: Diagnosis not present

## 2019-09-15 DIAGNOSIS — Z91018 Allergy to other foods: Secondary | ICD-10-CM | POA: Insufficient documentation

## 2019-09-15 DIAGNOSIS — I340 Nonrheumatic mitral (valve) insufficiency: Secondary | ICD-10-CM | POA: Diagnosis not present

## 2019-09-15 DIAGNOSIS — O99355 Diseases of the nervous system complicating the puerperium: Secondary | ICD-10-CM | POA: Diagnosis not present

## 2019-09-15 DIAGNOSIS — Z888 Allergy status to other drugs, medicaments and biological substances status: Secondary | ICD-10-CM | POA: Diagnosis not present

## 2019-09-15 DIAGNOSIS — O99342 Other mental disorders complicating pregnancy, second trimester: Secondary | ICD-10-CM | POA: Insufficient documentation

## 2019-09-15 DIAGNOSIS — G43709 Chronic migraine without aura, not intractable, without status migrainosus: Secondary | ICD-10-CM

## 2019-09-15 DIAGNOSIS — F419 Anxiety disorder, unspecified: Secondary | ICD-10-CM | POA: Diagnosis not present

## 2019-09-15 DIAGNOSIS — O26892 Other specified pregnancy related conditions, second trimester: Secondary | ICD-10-CM

## 2019-09-15 DIAGNOSIS — Z803 Family history of malignant neoplasm of breast: Secondary | ICD-10-CM | POA: Diagnosis not present

## 2019-09-15 DIAGNOSIS — O99412 Diseases of the circulatory system complicating pregnancy, second trimester: Secondary | ICD-10-CM | POA: Diagnosis not present

## 2019-09-15 LAB — URINALYSIS, ROUTINE W REFLEX MICROSCOPIC
Bilirubin Urine: NEGATIVE
Glucose, UA: NEGATIVE mg/dL
Hgb urine dipstick: NEGATIVE
Ketones, ur: 5 mg/dL — AB
Leukocytes,Ua: NEGATIVE
Nitrite: NEGATIVE
Protein, ur: NEGATIVE mg/dL
Specific Gravity, Urine: 1.015 (ref 1.005–1.030)
pH: 6 (ref 5.0–8.0)

## 2019-09-15 LAB — COMPREHENSIVE METABOLIC PANEL
ALT: 12 U/L (ref 0–44)
AST: 11 U/L — ABNORMAL LOW (ref 15–41)
Albumin: 3.4 g/dL — ABNORMAL LOW (ref 3.5–5.0)
Alkaline Phosphatase: 38 U/L (ref 38–126)
Anion gap: 9 (ref 5–15)
BUN: 5 mg/dL — ABNORMAL LOW (ref 6–20)
CO2: 21 mmol/L — ABNORMAL LOW (ref 22–32)
Calcium: 9.2 mg/dL (ref 8.9–10.3)
Chloride: 105 mmol/L (ref 98–111)
Creatinine, Ser: 0.67 mg/dL (ref 0.44–1.00)
GFR calc Af Amer: >60 mL/min (ref >60)
GFR calc non Af Amer: >60 mL/min (ref >60)
Glucose, Bld: 93 mg/dL (ref 70–99)
Potassium: 3.5 mmol/L (ref 3.5–5.1)
Sodium: 135 mmol/L (ref 135–145)
Total Bilirubin: 0.5 mg/dL (ref 0.3–1.2)
Total Protein: 6.3 g/dL — ABNORMAL LOW (ref 6.5–8.1)

## 2019-09-15 LAB — LIPASE, BLOOD: Lipase: 24 U/L (ref 11–51)

## 2019-09-15 LAB — TSH: TSH: 0.489 u[IU]/mL (ref 0.350–4.500)

## 2019-09-15 MED ORDER — BUPIVACAINE HCL (PF) 0.25 % IJ SOLN
10.0000 mL | INTRAMUSCULAR | Status: AC
  Administered 2019-09-15: 10 mL
  Filled 2019-09-15 (×2): qty 10

## 2019-09-15 MED ORDER — M.V.I. ADULT IV INJ: Freq: Once | INTRAVENOUS | Status: DC

## 2019-09-15 MED ORDER — DEXAMETHASONE SODIUM PHOSPHATE 10 MG/ML IJ SOLN
10.0000 mg | Freq: Once | INTRAMUSCULAR | Status: AC
  Administered 2019-09-15: 10 mg via INTRAVENOUS
  Filled 2019-09-15: qty 1

## 2019-09-15 MED ORDER — PROMETHAZINE HCL 12.5 MG PO TABS: 12.5000 mg | ORAL_TABLET | Freq: Every evening | ORAL | 0 refills | Status: DC | PRN

## 2019-09-15 MED ORDER — CYCLOBENZAPRINE HCL 10 MG PO TABS
10.0000 mg | ORAL_TABLET | Freq: Once | ORAL | Status: AC
  Administered 2019-09-15: 10 mg via ORAL
  Filled 2019-09-15: qty 1

## 2019-09-15 MED ORDER — METOCLOPRAMIDE HCL 10 MG PO TABS: 10.0000 mg | ORAL_TABLET | Freq: Three times a day (TID) | ORAL | 2 refills | Status: DC

## 2019-09-15 MED ORDER — POTASSIUM CHLORIDE 2 MEQ/ML IV SOLN
Freq: Once | INTRAVENOUS | Status: AC
  Administered 2019-09-15: 19:00:00 via INTRAVENOUS
  Filled 2019-09-15: qty 1000

## 2019-09-15 MED ORDER — SODIUM CHLORIDE 0.9 % IV SOLN
12.5000 mg | Freq: Once | INTRAVENOUS | Status: AC
  Administered 2019-09-15: 12.5 mg via INTRAVENOUS
  Filled 2019-09-15: qty 0.5

## 2019-09-15 MED ORDER — CYCLOBENZAPRINE HCL 10 MG PO TABS: 10.0000 mg | ORAL_TABLET | Freq: Three times a day (TID) | ORAL | 0 refills | Status: DC | PRN

## 2019-09-15 MED ORDER — KETOROLAC TROMETHAMINE 30 MG/ML IJ SOLN
15.0000 mg | Freq: Once | INTRAMUSCULAR | Status: AC
  Administered 2019-09-15: 15 mg via INTRAVENOUS
  Filled 2019-09-15 (×2): qty 1

## 2019-09-15 MED ORDER — DIPHENHYDRAMINE HCL 50 MG/ML IJ SOLN
12.5000 mg | Freq: Once | INTRAMUSCULAR | Status: AC
  Administered 2019-09-15: 12.5 mg via INTRAVENOUS
  Filled 2019-09-15: qty 1

## 2019-09-15 MED ORDER — IBUPROFEN 600 MG PO TABS: 600.0000 mg | ORAL_TABLET | Freq: Four times a day (QID) | ORAL | 0 refills | Status: DC | PRN

## 2019-09-15 NOTE — Procedures (Signed)
SUBJECTIVE 28 yo female with chronic migraines, history of previous trigger point injections  OBJECTIVE: Vitals: reviewed GU: normal female anatomy, bilateral testes descended, no evidence of Epi- or hypospadias.   Procedure: Bilateral greater and lesser occipital nerve blocks, trigger point injections Indication: Intractable migraine, history good response with previous trigger point injections EBL: None Complications: None immediate Anesthesia: 0.25% bupivicaine, 10 cc used  Procedure in detail:  Written consent was obtained after the risks and benefits of the procedure were discussed.   The patient was seated in the upright position. The greater and lesser occipital nerves were found through palpation of the superior and inferior nuchal lines. The skin was prepped with alcohol and 1.0 cc was injected into the greater occipital nerve area bilaterally. 0.5 cc was injected into the lesser occipital nerve areas bilaterally. Patient had immediate improvement in her pain.  6 trigger points were palpated along the cervical muscles and medial trapezius. The skin was prepped with alcohol and 1.0 cc was injected into each trigger point.  The patient felt immediate relief of pain and tolerate the procedure well.  Merilyn Baba, DO OB Fellow, [facility] 09/15/2019 9:40 PM

## 2019-09-15 NOTE — MAU Provider Note (Addendum)
History     CSN: [id number]  Arrival date and time: 09/15/19 1447   First Provider Initiated Contact with Patient 09/15/19 1644      Chief Complaint  Patient presents with  . Nausea  . Emesis  . Dizziness   HPI   Michelle Vincent is a 28 y.o. female G1P0000 @ [redacted]w[redacted]d with a history of  . Allergic rhinitis  . Anemia  . Anesthesia  slow to wake up  . Anxiety  . Fatty liver  . MR (mitral regurgitation) 03/2014  1+  . Nodule of right lung 12/2013  5 mm left upper lobe CT due in 12/2014 - completed and stable- no further f/u needed  . OM (otitis media)  . Orthostatic hypotension  . Ovarian cyst 01/2015  . TR (tricuspid regurgitation) 03/2014   here with nausea and vomiting/ migraine HA. The patient has had > 6 visit to MAU for this complaint. Multiple ED visits this year for HA's. She has tried multiple different regimens however is not taking anything regularly.  Her last doses of nausea medication were Zofran 8 mg and compazine at 7:30 am and then Zofran 4 mg down this morning at 10 am.  Says the last time she vomited was 1430. Today she was able to keep down a plain biscuit, some saltines and some animal crackers. She has been drinking water.   Ongoing migraines. Has a migraine specialist that she see's however was told by her that she cannot take anything while she is pregnant. Migraines started at age 35. She has been followed by neurology, pain management, and PCP. She was taking Aimovig and Zonegran.    Pre-pregnancy weight 69 kg Weight today 11/9: 70.353  OB History    Gravida  1   Para  0   Term  0   Preterm  0   AB  0   Living  0     SAB  0   TAB  0   Ectopic  0   Multiple  0   Live Births  0           Past Medical History:  Diagnosis Date  . Anemia   . Anxiety    takes lexapro  . Asthma    prn inhaler use  . HA (headache)   . Occipital neuralgia   . Sinus tachycardia   . SVT (supraventricular tachycardia) (HCC)     Past Surgical  History:  Procedure Laterality Date  . shoulder Right   . TONSILLECTOMY AND ADENOIDECTOMY      Family History  Problem Relation Age of Onset  . Hypertension Mother   . Transient ischemic attack Mother   . Diabetes Brother        Type 1  . Breast cancer Maternal Aunt   . Cancer Maternal Aunt     Social History   Tobacco Use  . Smoking status: Never Smoker  . Smokeless tobacco: Never Used  Substance Use Topics  . Alcohol use: Not Currently    Alcohol/week: 1.0 standard drinks    Types: 1 Glasses of wine per week    Frequency: Never  . Drug use: Never    Allergies:  Allergies  Allergen Reactions  . Nitrous Oxide Anaphylaxis  . Peach Flavor Anaphylaxis  . Shellfish Allergy Anaphylaxis  . Adhesive [Tape] Hives, Itching and Other (See Comments)    blisters  . Reglan [Metoclopramide]     Muscle spasms. Pt takes med at home    Medications  Prior to Admission  Medication Sig Dispense Refill Last Dose  . doxylamine, Sleep, (UNISOM) 25 MG tablet Take 1 tablet (25 mg total) by mouth at bedtime as needed. 30 tablet 0 Past Month at Unknown time  . escitalopram (LEXAPRO) 10 MG tablet Take 1 tablet (10 mg total) by mouth daily. 30 tablet 3 09/15/2019 at Unknown time  . famotidine (PEPCID) 20 MG tablet Take 1 tablet (20 mg total) by mouth 2 (two) times daily. 60 tablet 3 09/15/2019 at Unknown time  . ferrous sulfate 325 (65 FE) MG tablet Take 325 mg by mouth 3 (three) times daily.   09/14/2019 at Unknown time  . Magnesium Gluconate 550 MG TABS TAKE 1 TABLET BY MOUTH 2 TIMES DAILY 60 tablet 30 09/14/2019 at Unknown time  . methocarbamol (ROBAXIN) 750 MG tablet Take 750 mg by mouth 3 (three) times daily as needed (for headache).    Past Month at Unknown time  . metoCLOPramide (REGLAN) 10 MG tablet Take 1 tablet (10 mg total) by mouth 4 (four) times daily as needed for nausea or vomiting. 30 tablet 2 09/14/2019 at Unknown time  . Prenatal Vit-Fe Fumarate-FA (PRENATAL MULTIVITAMIN) TABS tablet  Take 1 tablet by mouth daily at 12 noon.   09/14/2019 at Unknown time  . prochlorperazine (COMPAZINE) 10 MG tablet Take 1 tablet (10 mg total) by mouth every 8 (eight) hours as needed for nausea or vomiting. 30 tablet 0 09/15/2019 at Unknown time  . pyridOXINE (VITAMIN B-6) 100 MG tablet Take 1 tablet (100 mg total) by mouth daily. 30 tablet 2 Past Month at Unknown time  . zolpidem (AMBIEN) 10 MG tablet Take 10 mg by mouth at bedtime.   09/14/2019 at Unknown time  . zolpidem (AMBIEN) 10 MG tablet Take by mouth.   09/14/2019 at Unknown time  . Doxylamine-Pyridoxine (DICLEGIS) 10-10 MG TBEC Take 2 tablets by mouth at bedtime. If symptoms persist, add one tablet in the morning and one in the afternoon (Patient not taking: Reported on 08/12/2019) 100 tablet 5   . methylPREDNISolone (MEDROL) 8 MG tablet Day 1, 2 & 3: 2 tabs TID. Day 4 &5: 2 tabs am & pm, 1 tab midday. Day 6: 1 tab TID. Day 7: 1 tab am & pm, 1/2 tab midday, Day 8: 1 tab am, 1/2 tab midday and pm. Day 9 &10 : 1 tab am, 1/2 tab midday. Day 11& 12: 1 tab Q am. Day 13 & 14: 1/2 tab Q am 40 tablet 0   . prochlorperazine (COMPAZINE) 10 MG tablet Take 1 tablet (10 mg total) by mouth 2 (two) times daily as needed for nausea or vomiting. (Patient not taking: Reported on 08/12/2019) 20 tablet 0   . propranolol (INDERAL) 10 MG tablet TAKE 1 TABLET BY MOUTH DAILY AS NEEDED (Patient taking differently: Take 10 mg by mouth daily as needed (svt). ) 30 tablet 10    Results for orders placed or performed during the hospital encounter of 09/15/19 (from the past 48 hour(s))  Comprehensive metabolic panel     Status: Abnormal   Collection Time: 09/15/19  3:28 PM  Result Value Ref Range   Sodium 135 135 - 145 mmol/L   Potassium 3.5 3.5 - 5.1 mmol/L   Chloride 105 98 - 111 mmol/L   CO2 21 (L) 22 - 32 mmol/L   Glucose, Bld 93 70 - 99 mg/dL   BUN 5 (L) 6 - 20 mg/dL   Creatinine, Ser 7.67 0.44 - 1.00 mg/dL  Calcium 9.2 8.9 - 10.3 mg/dL   Total Protein 6.3 (L) 6.5  - 8.1 g/dL   Albumin 3.4 (L) 3.5 - 5.0 g/dL   AST 11 (L) 15 - 41 U/L   ALT 12 0 - 44 U/L   Alkaline Phosphatase 38 38 - 126 U/L   Total Bilirubin 0.5 0.3 - 1.2 mg/dL   GFR calc non Af Amer >60 >60 mL/min   GFR calc Af Amer >60 >60 mL/min   Anion gap 9 5 - 15    Comment: Performed at [facility], [address] [place], [place] [address]  TSH     Status: None   Collection Time: 09/15/19  3:28 PM  Result Value Ref Range   TSH 0.489 0.350 - 4.500 uIU/mL    Comment: Performed by a 3rd Generation assay with a functional sensitivity of <=0.01 uIU/mL. Performed at [facility], [address] [place], [place] [address]   Lipase, blood     Status: None   Collection Time: 09/15/19  3:42 PM  Result Value Ref Range   Lipase 24 11 - 51 U/L    Comment: Performed at [facility], [address] [place], [place] [address]  Urinalysis, Routine w reflex microscopic     Status: Abnormal   Collection Time: 09/15/19  4:22 PM  Result Value Ref Range   Color, Urine YELLOW YELLOW   APPearance CLOUDY (A) CLEAR   Specific Gravity, Urine 1.015 1.005 - 1.030   pH 6.0 5.0 - 8.0   Glucose, UA NEGATIVE NEGATIVE mg/dL   Hgb urine dipstick NEGATIVE NEGATIVE   Bilirubin Urine NEGATIVE NEGATIVE   Ketones, ur 5 (A) NEGATIVE mg/dL   Protein, ur NEGATIVE NEGATIVE mg/dL   Nitrite NEGATIVE NEGATIVE   Leukocytes,Ua NEGATIVE NEGATIVE    Comment: Performed at [facility], [address] [place], [place] [address]   Review of Systems  Constitutional: Negative for fever.  Eyes: Positive for photophobia.  Gastrointestinal: Positive for nausea and vomiting. Negative for diarrhea.  Neurological: Positive for headaches.   Physical Exam   Blood pressure (!) 113/59, pulse 85, temperature 98.1 F (36.7 C), temperature source Oral, resp. rate 16, weight 70.4 kg, last menstrual period 06/01/2019, SpO2 99 %.  Physical Exam  Constitutional: She is oriented to person, place, and time. She  appears well-developed and well-nourished. She has a sickly appearance. She appears ill. No distress.  Musculoskeletal: Normal range of motion.  Neurological: She is alert and oriented to person, place, and time.  Skin: Skin is warm. She is not diaphoretic. There is pallor.  Psychiatric: Her behavior is normal.   MAU Course  Procedures  None  MDM  + fetal heart tones via doppler  UA shows 5 of ketones, otherwise stable, specific gravity 1.015 Discussed doing a bolus of MVI and patient is agreeable and appreciative.  K+ 3.5: added 10 meq of K+ HA cocktail as patient says this has been beneficial in the past. She is unable to take IV reglan d/t reaction, replaced that with phenergan 12.5 IV.  Discussed referring her to see [facility] at [facility] for her migraines. She is agreeable.  No relief; continues to rate HA 9/10. One episode of vomiting, Small amount of clear fluid as witnessed by RN.  Toradol 30 mg IV & Flexeril given.  Orthostatic vitals show HR increase from sitting (59) to standing (82) Patient up to the bathroom w/o difficulty.  Lipase normal  HA pain  now down to 3/10. Discussed patient with Dr. Roselie Awkward. Ok to restart Zonegran however patient is hesitant because her neurologist was adamant that she stopped it during pregnancy. Dr. Trixie Rude called and recommends trigger point injections. Patient agreeable and very appreciative.   Assessment and Plan   A:  1. Nausea/vomiting in pregnancy   2. Chronic migraine without aura without status migrainosus, not intractable   3. [redacted] weeks gestation of pregnancy     P:  Discharge home with return precautions Message sent to Allie Dimmer to schedule appt ASAP for migraine management.  Rx: Ibuprofen, Flexeril Return to MAU if symptoms worsen  Continue Reglan TID, Phenergan at bedtime. Stressed VAGINAL use.  Care turned over to Dr. Trixie Rude @ 2045; patient awaiting injections.  Michelle Lye, NP 09/15/2019 8:45  PM  Care assumed from Michelle Saupe, NP.  9:30 PM- Trigger point injections and occipital nerve blocks performed, consent was signed- see procedure note for details  10:57 PM -patient's pain has completely resolve, nausea is greatly improved -patient would like to DC home -return precautions given, plan outlined as above  Michelle Vincent, Michelle Europe, DO OB Fellow, [facility] 09/15/2019 10:59 PM

## 2019-09-15 NOTE — Discharge Instructions (Signed)
Trigger Point Injection Trigger points are areas where you have pain. A trigger point injection is a shot given in the trigger point to help relieve pain for a few days to a few months. Common places for trigger points include:  The neck.  The shoulders.  The upper back.  The lower back. A trigger point injection will not cure long-term (chronic) pain permanently. These injections do not always work for every person. For some people, they can help to relieve pain for a few days to a few months. Tell a health care provider about:  Any allergies you have.  All medicines you are taking, including vitamins, herbs, eye drops, creams, and over-the-counter medicines.  Any problems you or family members have had with anesthetic medicines.  Any blood disorders you have.  Any surgeries you have had.  Any medical conditions you have. What are the risks? Generally, this is a safe procedure. However, problems may occur, including:  Infection.  Bleeding or bruising.  Allergic reaction to the injected medicine.  Irritation of the skin around the injection site. What happens before the procedure? Ask your health care provider about:  Changing or stopping your regular medicines. This is especially important if you are taking diabetes medicines or blood thinners.  Taking medicines such as aspirin and ibuprofen. These medicines can thin your blood. Do not take these medicines unless your health care provider tells you to take them.  Taking over-the-counter medicines, vitamins, herbs, and supplements. What happens during the procedure?   Your health care provider will feel for trigger points. A marker may be used to circle the area for the injection.  The skin over the trigger point will be washed with a germ-killing (antiseptic) solution.  A thin needle is used for the injection. You may feel pain or a twitching feeling when the needle enters the trigger point.  A numbing solution may  be injected into the trigger point. Sometimes a medicine to keep down inflammation is also injected.  Your health care provider may move the needle around the area where the trigger point is located until the tightness and twitching goes away.  After the injection, your health care provider may put gentle pressure over the injection site.  The injection site will be covered with a bandage (dressing). The procedure may vary among health care providers and hospitals. What can I expect after treatment? After treatment, you may have:  Soreness and stiffness for 1-2 days.  A dressing. This can be taken off in a few hours or as told by your health care provider. Follow these instructions at home: Injection site care  Remove your dressing as told by your health care provider.  Check your injection site every day for signs of infection. Check for: ? Redness, swelling, or pain. ? Fluid or blood. ? Warmth. ? Pus or a bad smell. Managing pain, stiffness, and swelling  If directed, put ice on the affected area. ? Put ice in a plastic bag. ? Place a towel between your skin and the bag. ? Leave the ice on for 20 minutes, 2-3 times a day. General instructions  If you were asked to stop your regular medicines, ask your health care provider when you may start taking them again.  Return to your normal activities as told by your health care provider. Ask your health care provider what activities are safe for you.  Do not take baths, swim, or use a hot tub until your health care provider approves.    You may be asked to see an occupational or physical therapist for exercises that reduce muscle strain and stretch the area of the trigger point.  Keep all follow-up visits as told by your health care provider. This is important. Contact a health care provider if:  Your pain comes back, and it is worse than before the injection. You may need more injections.  You have chills or a fever.  The  injection site becomes more painful, red, swollen, or warm to the touch. Summary  A trigger point injection is a shot given in the trigger point to help relieve pain for a few days to a few months.  Common places for trigger point injections are the neck, shoulder, upper back, and lower back.  These injections do not always work for every person, but for some people, the injections can help to relieve pain for a few days to a few months.  Contact a health care provider if symptoms come back or they are worse than before treatment. Also, get help if the injection site becomes more painful, red, swollen, or warm to the touch. This information is not intended to replace advice given to you by your health care provider. Make sure you discuss any questions you have with your health care provider. Document Released: 10/12/2011 Document Revised: 12/04/2018 Document Reviewed: 12/04/2018 Elsevier Patient Education  2020 [employer]  

## 2019-09-15 NOTE — Telephone Encounter (Signed)
Patient called stating she sent a my chart message and is having daily vomiting. Patient advised she can go to MAU for IV fluids. Patient states she doesn't want to go back to MAU as "all the midwife wanted to talk to me about was a diet".   Patient made aware that I have sent her note to Dr. Nehemiah Settle and will send this phone note as well. Kathrene Alu RN

## 2019-09-15 NOTE — MAU Note (Signed)
Ongoing n/v. Dizziness.

## 2019-09-22 ENCOUNTER — Other Ambulatory Visit: Payer: Self-pay

## 2019-09-22 ENCOUNTER — Inpatient Hospital Stay (HOSPITAL_COMMUNITY)
Admission: AD | Admit: 2019-09-22 | Discharge: 2019-09-22 | Disposition: A | Discharge status: Home / Self Care | Payer: 59 | Attending: Obstetrics & Gynecology | Admitting: Obstetrics & Gynecology

## 2019-09-22 ENCOUNTER — Ambulatory Visit (INDEPENDENT_AMBULATORY_CARE_PROVIDER_SITE_OTHER): Payer: 59 | Admitting: Family Medicine

## 2019-09-22 ENCOUNTER — Encounter (HOSPITAL_COMMUNITY): Payer: Self-pay | Admitting: *Deleted

## 2019-09-22 VITALS — BP 105/58 | HR 83 | Wt 159.0 lb

## 2019-09-22 DIAGNOSIS — D649 Anemia, unspecified: Secondary | ICD-10-CM | POA: Diagnosis not present

## 2019-09-22 DIAGNOSIS — I471 Supraventricular tachycardia: Secondary | ICD-10-CM | POA: Diagnosis not present

## 2019-09-22 DIAGNOSIS — O26892 Other specified pregnancy related conditions, second trimester: Secondary | ICD-10-CM | POA: Diagnosis not present

## 2019-09-22 DIAGNOSIS — M9900 Segmental and somatic dysfunction of head region: Secondary | ICD-10-CM

## 2019-09-22 DIAGNOSIS — Z833 Family history of diabetes mellitus: Secondary | ICD-10-CM | POA: Insufficient documentation

## 2019-09-22 DIAGNOSIS — Z91013 Allergy to seafood: Secondary | ICD-10-CM | POA: Diagnosis not present

## 2019-09-22 DIAGNOSIS — G43709 Chronic migraine without aura, not intractable, without status migrainosus: Secondary | ICD-10-CM | POA: Insufficient documentation

## 2019-09-22 DIAGNOSIS — O99512 Diseases of the respiratory system complicating pregnancy, second trimester: Secondary | ICD-10-CM | POA: Diagnosis not present

## 2019-09-22 DIAGNOSIS — M9902 Segmental and somatic dysfunction of thoracic region: Secondary | ICD-10-CM

## 2019-09-22 DIAGNOSIS — Z79899 Other long term (current) drug therapy: Secondary | ICD-10-CM | POA: Insufficient documentation

## 2019-09-22 DIAGNOSIS — O99412 Diseases of the circulatory system complicating pregnancy, second trimester: Secondary | ICD-10-CM | POA: Diagnosis not present

## 2019-09-22 DIAGNOSIS — R12 Heartburn: Secondary | ICD-10-CM | POA: Insufficient documentation

## 2019-09-22 DIAGNOSIS — O99342 Other mental disorders complicating pregnancy, second trimester: Secondary | ICD-10-CM | POA: Diagnosis not present

## 2019-09-22 DIAGNOSIS — Z8249 Family history of ischemic heart disease and other diseases of the circulatory system: Secondary | ICD-10-CM | POA: Diagnosis not present

## 2019-09-22 DIAGNOSIS — J45909 Unspecified asthma, uncomplicated: Secondary | ICD-10-CM | POA: Insufficient documentation

## 2019-09-22 DIAGNOSIS — M9901 Segmental and somatic dysfunction of cervical region: Secondary | ICD-10-CM

## 2019-09-22 DIAGNOSIS — O99012 Anemia complicating pregnancy, second trimester: Secondary | ICD-10-CM | POA: Diagnosis not present

## 2019-09-22 DIAGNOSIS — Z803 Family history of malignant neoplasm of breast: Secondary | ICD-10-CM | POA: Diagnosis not present

## 2019-09-22 DIAGNOSIS — F419 Anxiety disorder, unspecified: Secondary | ICD-10-CM | POA: Insufficient documentation

## 2019-09-22 DIAGNOSIS — Z3A16 16 weeks gestation of pregnancy: Secondary | ICD-10-CM

## 2019-09-22 DIAGNOSIS — M9908 Segmental and somatic dysfunction of rib cage: Secondary | ICD-10-CM

## 2019-09-22 DIAGNOSIS — O99891 Other specified diseases and conditions complicating pregnancy: Secondary | ICD-10-CM | POA: Insufficient documentation

## 2019-09-22 DIAGNOSIS — Z91018 Allergy to other foods: Secondary | ICD-10-CM | POA: Insufficient documentation

## 2019-09-22 DIAGNOSIS — O99352 Diseases of the nervous system complicating pregnancy, second trimester: Secondary | ICD-10-CM | POA: Diagnosis present

## 2019-09-22 DIAGNOSIS — Z888 Allergy status to other drugs, medicaments and biological substances status: Secondary | ICD-10-CM | POA: Diagnosis not present

## 2019-09-22 DIAGNOSIS — Z3400 Encounter for supervision of normal first pregnancy, unspecified trimester: Secondary | ICD-10-CM

## 2019-09-22 DIAGNOSIS — R000 Tachycardia, unspecified: Secondary | ICD-10-CM

## 2019-09-22 DIAGNOSIS — O210 Mild hyperemesis gravidarum: Secondary | ICD-10-CM

## 2019-09-22 LAB — COMPREHENSIVE METABOLIC PANEL
ALT: 11 U/L (ref 0–44)
AST: 12 U/L — ABNORMAL LOW (ref 15–41)
Albumin: 3.1 g/dL — ABNORMAL LOW (ref 3.5–5.0)
Alkaline Phosphatase: 45 U/L (ref 38–126)
Anion gap: 8 (ref 5–15)
BUN: 7 mg/dL (ref 6–20)
CO2: 22 mmol/L (ref 22–32)
Calcium: 8.9 mg/dL (ref 8.9–10.3)
Chloride: 105 mmol/L (ref 98–111)
Creatinine, Ser: 0.68 mg/dL (ref 0.44–1.00)
GFR calc Af Amer: >60 mL/min (ref >60)
GFR calc non Af Amer: >60 mL/min (ref >60)
Glucose, Bld: 86 mg/dL (ref 70–99)
Potassium: 3.8 mmol/L (ref 3.5–5.1)
Sodium: 135 mmol/L (ref 135–145)
Total Bilirubin: 0.3 mg/dL (ref 0.3–1.2)
Total Protein: 5.9 g/dL — ABNORMAL LOW (ref 6.5–8.1)

## 2019-09-22 LAB — CBC
HCT: 34.4 % — ABNORMAL LOW (ref 36.0–46.0)
Hemoglobin: 11.7 g/dL — ABNORMAL LOW (ref 12.0–15.0)
MCH: 31 pg (ref 26.0–34.0)
MCHC: 34 g/dL (ref 30.0–36.0)
MCV: 91.2 fL (ref 80.0–100.0)
Platelets: 282 10*3/uL (ref 150–400)
RBC: 3.77 MIL/uL — ABNORMAL LOW (ref 3.87–5.11)
RDW: 13 % (ref 11.5–15.5)
WBC: 10.6 10*3/uL — ABNORMAL HIGH (ref 4.0–10.5)
nRBC: 0 % (ref 0.0–0.2)

## 2019-09-22 LAB — URINALYSIS, ROUTINE W REFLEX MICROSCOPIC
Bilirubin Urine: NEGATIVE
Glucose, UA: NEGATIVE mg/dL
Hgb urine dipstick: NEGATIVE
Ketones, ur: NEGATIVE mg/dL
Leukocytes,Ua: NEGATIVE
Nitrite: NEGATIVE
Protein, ur: 30 mg/dL — AB
Specific Gravity, Urine: 1.019 (ref 1.005–1.030)
pH: 6 (ref 5.0–8.0)

## 2019-09-22 LAB — LIPASE, BLOOD: Lipase: 25 U/L (ref 11–51)

## 2019-09-22 LAB — AMYLASE: Amylase: 42 U/L (ref 28–100)

## 2019-09-22 LAB — TROPONIN I (HIGH SENSITIVITY): Troponin I (High Sensitivity): 4 ng/L (ref <18)

## 2019-09-22 MED ORDER — FAMOTIDINE IN NACL 20-0.9 MG/50ML-% IV SOLN
20.0000 mg | Freq: Once | INTRAVENOUS | Status: AC
  Administered 2019-09-22: 20 mg via INTRAVENOUS
  Filled 2019-09-22: qty 50

## 2019-09-22 MED ORDER — LACTATED RINGERS IV BOLUS
1000.0000 mL | Freq: Once | INTRAVENOUS | Status: AC
  Administered 2019-09-22: 1000 mL via INTRAVENOUS

## 2019-09-22 MED ORDER — ALUM & MAG HYDROXIDE-SIMETH 200-200-20 MG/5ML PO SUSP
30.0000 mL | Freq: Once | ORAL | Status: AC
  Administered 2019-09-22: 30 mL via ORAL
  Filled 2019-09-22: qty 30

## 2019-09-22 MED ORDER — DEXAMETHASONE SODIUM PHOSPHATE 10 MG/ML IJ SOLN
10.0000 mg | Freq: Once | INTRAMUSCULAR | Status: AC
  Administered 2019-09-22: 10 mg via INTRAVENOUS
  Filled 2019-09-22: qty 1

## 2019-09-22 MED ORDER — DIPHENHYDRAMINE HCL 50 MG/ML IJ SOLN
25.0000 mg | Freq: Once | INTRAMUSCULAR | Status: AC
  Administered 2019-09-22: 25 mg via INTRAVENOUS
  Filled 2019-09-22: qty 1

## 2019-09-22 MED ORDER — ONDANSETRON 4 MG PO TBDP
8.0000 mg | ORAL_TABLET | Freq: Once | ORAL | Status: AC
  Administered 2019-09-22: 8 mg via ORAL
  Filled 2019-09-22: qty 2

## 2019-09-22 MED ORDER — KETOROLAC TROMETHAMINE 60 MG/2ML IM SOLN
30.0000 mg | Freq: Once | INTRAMUSCULAR | Status: AC
  Administered 2019-09-22: 30 mg via INTRAMUSCULAR

## 2019-09-22 MED ORDER — LIDOCAINE VISCOUS HCL 2 % MT SOLN
15.0000 mL | Freq: Once | OROMUCOSAL | Status: AC
  Administered 2019-09-22: 15 mL via ORAL
  Filled 2019-09-22: qty 15

## 2019-09-22 MED ORDER — PROCHLORPERAZINE EDISYLATE 10 MG/2ML IJ SOLN
10.0000 mg | Freq: Once | INTRAMUSCULAR | Status: AC
  Administered 2019-09-22: 14:00:00 10 mg via INTRAVENOUS
  Filled 2019-09-22: qty 2

## 2019-09-22 NOTE — Progress Notes (Signed)
Patient given ketorolac 30mg  in left gluteal. Patient tolerated well, no side effects to note at this time.

## 2019-09-22 NOTE — MAU Note (Addendum)
.   Michelle Vincent is a 28 y.o. at [redacted]w[redacted]d here in MAU reporting: that she was told to come here for IV fluids and meds by Dr Nehemiah Settle at her appointment for her headache. Denies any VB. Reports mild low abdominal cramping  Onset of complaint: Yesterday Pain score: 8 Vitals:   09/22/19 1249  BP: (!) 110/58  Pulse: 86  Resp: 16  Temp: 97.6 F (36.4 C)  SpO2: 100%     FHT:144 Lab orders placed from triage: UA

## 2019-09-22 NOTE — Discharge Instructions (Signed)
Migraine Headache A migraine headache is an intense, throbbing pain on one side or both sides of the head. Migraine headaches may also cause other symptoms, such as nausea, vomiting, and sensitivity to light and noise. A migraine headache can last from 4 hours to 3 days. Talk with your doctor about what things may bring on (trigger) your migraine headaches. What are the causes? The exact cause of this condition is not known. However, a migraine may be caused when nerves in the brain become irritated and release chemicals that cause inflammation of blood vessels. This inflammation causes pain. This condition may be triggered or caused by:  Drinking alcohol.  Smoking.  Taking medicines, such as: ? Medicine used to treat chest pain (nitroglycerin). ? Birth control pills. ? Estrogen. ? Certain blood pressure medicines.  Eating or drinking products that contain nitrates, glutamate, aspartame, or tyramine. Aged cheeses, chocolate, or caffeine may also be triggers.  Doing physical activity. Other things that may trigger a migraine headache include:  Menstruation.  Pregnancy.  Hunger.  Stress.  Lack of sleep or too much sleep.  Weather changes.  Fatigue. What increases the risk? The following factors may make you more likely to experience migraine headaches:  Being a certain age. This condition is more common in people who are 26-74 years old.  Being female.  Having a family history of migraine headaches.  Being Caucasian.  Having a mental health condition, such as depression or anxiety.  Being obese. What are the signs or symptoms? The main symptom of this condition is pulsating or throbbing pain. This pain may:  Happen in any area of the head, such as on one side or both sides.  Interfere with daily activities.  Get worse with physical activity.  Get worse with exposure to bright lights or loud noises. Other symptoms may  include:  Nausea.  Vomiting.  Dizziness.  General sensitivity to bright lights, loud noises, or smells. Before you get a migraine headache, you may get warning signs (an aura). An aura may include:  Seeing flashing lights or having blind spots.  Seeing bright spots, halos, or zigzag lines.  Having tunnel vision or blurred vision.  Having numbness or a tingling feeling.  Having trouble talking.  Having muscle weakness. Some people have symptoms after a migraine headache (postdromal phase), such as:  Feeling tired.  Difficulty concentrating. How is this diagnosed? A migraine headache can be diagnosed based on:  Your symptoms.  A physical exam.  Tests, such as: ? CT scan or an MRI of the head. These imaging tests can help rule out other causes of headaches. ? Taking fluid from the spine (lumbar puncture) and analyzing it (cerebrospinal fluid analysis, or CSF analysis). How is this treated? This condition may be treated with medicines that:  Relieve pain.  Relieve nausea.  Prevent migraine headaches. Treatment for this condition may also include:  Acupuncture.  Lifestyle changes like avoiding foods that trigger migraine headaches.  Biofeedback.  Cognitive behavioral therapy. Follow these instructions at home: Medicines  Take over-the-counter and prescription medicines only as told by your health care provider.  Ask your health care provider if the medicine prescribed to you: ? Requires you to avoid driving or using heavy machinery. ? Can cause constipation. You may need to take these actions to prevent or treat constipation:  Drink enough fluid to keep your urine pale yellow.  Take over-the-counter or prescription medicines.  Eat foods that are high in fiber, such as beans, whole grains, and  fresh fruits and vegetables.  Limit foods that are high in fat and processed sugars, such as fried or sweet foods. Lifestyle  Do not drink alcohol.  Do not  use any products that contain nicotine or tobacco, such as cigarettes, e-cigarettes, and chewing tobacco. If you need help quitting, ask your health care provider.  Get at least 8 hours of sleep every night.  Find ways to manage stress, such as meditation, deep breathing, or yoga. General instructions      Keep a journal to find out what may trigger your migraine headaches. For example, write down: ? What you eat and drink. ? How much sleep you get. ? Any change to your diet or medicines.  If you have a migraine headache: ? Avoid things that make your symptoms worse, such as bright lights. ? It may help to lie down in a dark, quiet room. ? Do not drive or use heavy machinery. ? Ask your health care provider what activities are safe for you while you are experiencing symptoms.  Keep all follow-up visits as told by your health care provider. This is important. Contact a health care provider if:  You develop symptoms that are different or more severe than your usual migraine headache symptoms.  You have more than 15 headache days in one month. Get help right away if:  Your migraine headache becomes severe.  Your migraine headache lasts longer than 72 hours.  You have a fever.  You have a stiff neck.  You have vision loss.  Your muscles feel weak or like you cannot control them.  You start to lose your balance often.  You have trouble walking.  You faint.  You have a seizure. Summary  A migraine headache is an intense, throbbing pain on one side or both sides of the head. Migraines may also cause other symptoms, such as nausea, vomiting, and sensitivity to light and noise.  This condition may be treated with medicines and lifestyle changes. You may also need to avoid certain things that trigger a migraine headache.  Keep a journal to find out what may trigger your migraine headaches.  Contact your health care provider if you have more than 15 headache days in a  month or you develop symptoms that are different or more severe than your usual migraine headache symptoms. This information is not intended to replace advice given to you by your health care provider. Make sure you discuss any questions you have with your health care provider. Document Released: 10/23/2005 Document Revised: 02/14/2019 Document Reviewed: 12/05/2018 [employer] Patient Education  2020 [employer]     Heartburn During Pregnancy Heartburn is a type of pain or discomfort that can happen in the throat or chest. It is often described as a burning sensation. Heartburn is common during pregnancy because:  A hormone (progesterone) that is released during pregnancy may relax the valve (lower esophageal sphincter, or LES) that separates the esophagus from the stomach. This allows stomach acid to move up into the esophagus, causing heartburn.  The uterus gets larger and pushes up on the stomach, which pushes more acid into the esophagus. This is especially true in the later stages of pregnancy. Heartburn usually goes away or gets better after giving birth. What are the causes? Heartburn is caused by stomach acid backing up into the esophagus (reflux). Reflux can be triggered by:  Changing hormone levels.  Large meals.  Certain foods and beverages, such as coffee, chocolate, onions, and peppermint.  Exercise.  Increased stomach acid production. What increases the risk? You are more likely to experience heartburn during pregnancy if you:  Had heartburn prior to becoming pregnant.  Have been pregnant more than once before.  Are overweight or obese. The likelihood that you will get heartburn also increases as you get farther along in your pregnancy, especially during the last trimester. What are the signs or symptoms? Symptoms of this condition include:  Burning pain in the chest or lower throat.  Bitter taste in the mouth.  Coughing.  Problems  swallowing.  Vomiting.  Hoarse voice.  Asthma. Symptoms may get worse when you lie down or bend over. Symptoms are often worse at night. How is this diagnosed? This condition is diagnosed based on:  Your medical history.  Your symptoms.  Blood tests to check for a certain type of bacteria associated with heartburn.  Whether taking heartburn medicine relieves your symptoms.  Examination of the stomach and esophagus using a tube with a light and camera on the end (endoscopy). How is this treated? Treatment varies depending on how severe your symptoms are. Your health care provider may recommend:  Over-the-counter medicines (antacids or acid reducers) for mild heartburn.  Prescription medicines to decrease stomach acid or to protect your stomach lining.  Certain changes in your diet.  Raising the head of your bed so it is higher than the foot of the bed. This helps prevent stomach acid from backing up into the esophagus when you are lying down. Follow these instructions at home: Eating and drinking  Do not drink alcohol during your pregnancy.  Identify foods and beverages that make your symptoms worse, and avoid them.  Beverages that you may want to avoid include: ? Coffee and tea (with or without caffeine). ? Energy drinks and sports drinks. ? Carbonated drinks or sodas. ? Citrus fruit juices.  Foods that you may want to avoid include: ? Chocolate and cocoa. ? Peppermint and mint flavorings. ? Garlic, onions, and horseradish. ? Spicy and acidic foods, including peppers, chili powder, curry powder, vinegar, hot sauces, and barbecue sauce. ? Citrus fruits, such as oranges, lemons, and limes. ? Tomato-based foods, such as red sauce, chili, and salsa. ? Fried and fatty foods, such as donuts, french fries, potato chips, and high-fat dressings. ? High-fat meats, such as hot dogs, cold cuts, sausage, ham, and bacon. ? High-fat dairy items, such as whole milk, butter, and  cheese.  Eat small, frequent meals instead of large meals.  Avoid drinking large amounts of liquid with your meals.  Avoid eating meals during the 2-3 hours before bedtime.  Avoid lying down right after you eat.  Do not exercise right after you eat. Medicines  Take over-the-counter and prescription medicines only as told by your health care provider.  Do not take aspirin, ibuprofen, or other NSAIDs unless your health care provider tells you to do that.  You may be instructed to avoid medicines that contain sodium bicarbonate. General instructions   If directed, raise the head of your bed about 6 inches (15 cm) by putting blocks under the legs. Sleeping with more pillows does not effectively relieve heartburn because it only changes the position of your head.  Do not use any products that contain nicotine or tobacco, such as cigarettes and e-cigarettes. If you need help quitting, ask your health care provider.  Wear loose-fitting clothing.  Try to reduce your stress, such as with yoga or meditation. If you need help managing stress, ask your health care  provider.  Maintain a healthy weight. If you are overweight, work with your health care provider to safely lose weight.  Keep all follow-up visits as told by your health care provider. This is important. Contact a health care provider if:  You develop new symptoms.  Your symptoms do not improve with treatment.  You have unexplained weight loss.  You have difficulty swallowing.  You make loud sounds when you breathe (wheeze).  You have a cough that does not go away.  You have frequent heartburn for more than 2 weeks.  You have nausea or vomiting that does not get better with treatment.  You have pain in your abdomen. Get help right away if:  You have severe chest pain that spreads to your arm, neck, or jaw.  You feel sweaty, dizzy, or light-headed.  You have shortness of breath.  You have pain when  swallowing.  You vomit, and your vomit looks like blood or coffee grounds.  Your stool is bloody or black. This information is not intended to replace advice given to you by your health care provider. Make sure you discuss any questions you have with your health care provider. Document Released: 10/20/2000 Document Revised: 01/21/2018 Document Reviewed: 07/10/2016 [employer] Patient Education  2020 [employer]

## 2019-09-22 NOTE — MAU Provider Note (Signed)
Chief Complaint: Headache and Chest Pain   First Provider Initiated Contact with Patient 09/22/19 1311     SUBJECTIVE HPI: Michelle Vincent is a 28 y.o. G1P0000 at [redacted]w[redacted]d who presents to Maternity Admissions reporting headache & chest pain. States she was told to come to MAU by Dr. Nehemiah Settle for IV fluids.  Has had issues with n/v during this pregnancy & frequent migraines. Has f/u with headache specialist next month. Has been treated with headache cocktail in [place] & states that helps. Was given a toradol injection at the office this morning without improvement.  Current headache started last night. Reports left frontal throbbing & endorses n/v & photophobia. Also reports chest pain that has been ongoing. In the past it occurs with her SVT, took dose of propanolol last night. Today the chest pain has continued & feels sore. Says it could also be due to her n/v. No SOB or palpitations. Denies cough, sore throat, or fever/chills.  Has had some lower abdominal cramping & feels sore. Pain is worse with position changes & walking. Denies dysuria, vaginal bleeding, or LOF.  Last BM was yesterday but has been having issues with constipation.   Location: head & chest Quality: throbbing & crushing Severity: 8/10 on pain scale Duration: 1 day Timing: constant Modifying factors: lights make head worse. Nothing makes chest pain better or worse Associated signs and symptoms: n/v & abdominal pain  Past Medical History:  Diagnosis Date  . Anemia   . Anxiety    takes lexapro  . Asthma    prn inhaler use  . HA (headache)   . Occipital neuralgia   . Sinus tachycardia   . SVT (supraventricular tachycardia) (HCC)    OB History  Gravida Para Term Preterm AB Living  1 0 0 0 0 0  SAB TAB Ectopic Multiple Live Births  0 0 0 0 0    # Outcome Date GA Lbr Len/2nd Weight Sex Delivery Anes PTL Lv  1 Current            Past Surgical History:  Procedure Laterality Date  . shoulder Right   . TONSILLECTOMY  AND ADENOIDECTOMY     Social History   Socioeconomic History  . Marital status: Single    Spouse name: Not on file  . Number of children: Not on file  . Years of education: Not on file  . Highest education level: Not on file  Occupational History  . Not on file  Social Needs  . Financial resource strain: Not on file  . Food insecurity    Worry: Not on file    Inability: Not on file  . Transportation needs    Medical: Not on file    Non-medical: Not on file  Tobacco Use  . Smoking status: Never Smoker  . Smokeless tobacco: Never Used  Substance and Sexual Activity  . Alcohol use: Not Currently    Alcohol/week: 1.0 standard drinks    Types: 1 Glasses of wine per week    Frequency: Never  . Drug use: Never  . Sexual activity: Yes  Lifestyle  . Physical activity    Days per week: Not on file    Minutes per session: Not on file  . Stress: Not on file  Relationships  . Social Herbalist on phone: Not on file    Gets together: Not on file    Attends religious service: Not on file    Active member of club or organization: Not  on file    Attends meetings of clubs or organizations: Not on file    Relationship status: Not on file  . Intimate partner violence    Fear of current or ex partner: Not on file    Emotionally abused: Not on file    Physically abused: Not on file    Forced sexual activity: Not on file  Other Topics Concern  . Not on file  Social History Narrative   EMT   Family History  Problem Relation Age of Onset  . Hypertension Mother   . Transient ischemic attack Mother   . Diabetes Brother        Type 1  . Breast cancer Maternal Aunt   . Cancer Maternal Aunt    No current facility-administered medications on file prior to encounter.    Current Outpatient Medications on File Prior to Encounter  Medication Sig Dispense Refill  . cyclobenzaprine (FLEXERIL) 10 MG tablet Take 1 tablet (10 mg total) by mouth 3 (three) times daily as needed for  muscle spasms. 30 tablet 0  . escitalopram (LEXAPRO) 10 MG tablet Take 1 tablet (10 mg total) by mouth daily. 30 tablet 3  . famotidine (PEPCID) 20 MG tablet Take 1 tablet (20 mg total) by mouth 2 (two) times daily. 60 tablet 3  . ferrous sulfate 325 (65 FE) MG tablet Take 325 mg by mouth 3 (three) times daily.    . Magnesium Gluconate 550 MG TABS TAKE 1 TABLET BY MOUTH 2 TIMES DAILY 60 tablet 30  . metoCLOPramide (REGLAN) 10 MG tablet Take 1 tablet (10 mg total) by mouth 3 (three) times daily before meals. 30 tablet 2  . Prenatal Vit-Fe Fumarate-FA (PRENATAL MULTIVITAMIN) TABS tablet Take 1 tablet by mouth daily at 12 noon.    . promethazine (PHENERGAN) 12.5 MG tablet Take 1 tablet (12.5 mg total) by mouth at bedtime as needed for nausea or vomiting. 30 tablet 0  . propranolol (INDERAL) 10 MG tablet TAKE 1 TABLET BY MOUTH DAILY AS NEEDED (Patient taking differently: Take 10 mg by mouth daily as needed (svt). ) 30 tablet 10  . zolpidem (AMBIEN) 10 MG tablet Take 10 mg by mouth at bedtime.     Allergies  Allergen Reactions  . Nitrous Oxide Anaphylaxis  . Peach Flavor Anaphylaxis  . Shellfish Allergy Anaphylaxis  . Adhesive [Tape] Hives, Itching and Other (See Comments)    blisters  . Reglan [Metoclopramide]     Muscle spasms. Pt takes med at home    I have reviewed patient's Past Medical Hx, Surgical Hx, Family Hx, Social Hx, medications and allergies.   Review of Systems  Constitutional: Negative.   HENT: Negative for sore throat.   Eyes: Positive for photophobia.  Respiratory: Negative.  Negative for chest tightness and shortness of breath.   Cardiovascular: Positive for chest pain. Negative for palpitations and leg swelling.  Gastrointestinal: Positive for abdominal pain, constipation, nausea and vomiting. Negative for diarrhea.  Genitourinary: Negative.   Musculoskeletal: Negative for back pain and neck pain.  Neurological: Positive for headaches. Negative for syncope.     OBJECTIVE Patient Vitals for the past 24 hrs:  BP Temp Pulse Resp SpO2 Weight  09/22/19 1626 105/72 - 77 16 - -  09/22/19 1249 (!) 110/58 97.6 F (36.4 C) 86 16 100 % -  09/22/19 1242 - - - - - 72.1 kg   Constitutional: Well-developed, well-nourished female in no acute distress.  Cardiovascular: normal rate & rhythm, no murmur Respiratory: normal  rate and effort. Lung sounds clear throughout GI: Abd soft, non-tender, Pos BS x 4. No guarding or rebound tenderness MS: Extremities nontender, no edema, normal ROM Neurologic: Alert and oriented x 4.    LAB RESULTS Results for orders placed or performed during the hospital encounter of 09/22/19 (from the past 24 hour(s))  Urinalysis, Routine w reflex microscopic     Status: Abnormal   Collection Time: 09/22/19  1:38 PM  Result Value Ref Range   Color, Urine YELLOW YELLOW   APPearance CLOUDY (A) CLEAR   Specific Gravity, Urine 1.019 1.005 - 1.030   pH 6.0 5.0 - 8.0   Glucose, UA NEGATIVE NEGATIVE mg/dL   Hgb urine dipstick NEGATIVE NEGATIVE   Bilirubin Urine NEGATIVE NEGATIVE   Ketones, ur NEGATIVE NEGATIVE mg/dL   Protein, ur 30 (A) NEGATIVE mg/dL   Nitrite NEGATIVE NEGATIVE   Leukocytes,Ua NEGATIVE NEGATIVE   RBC / HPF 0-5 0 - 5 RBC/hpf   WBC, UA 0-5 0 - 5 WBC/hpf   Bacteria, UA RARE (A) NONE SEEN   Squamous Epithelial / LPF 0-5 0 - 5   Mucus PRESENT    Amorphous Crystal PRESENT   Comprehensive metabolic panel     Status: Abnormal   Collection Time: 09/22/19  1:40 PM  Result Value Ref Range   Sodium 135 135 - 145 mmol/L   Potassium 3.8 3.5 - 5.1 mmol/L   Chloride 105 98 - 111 mmol/L   CO2 22 22 - 32 mmol/L   Glucose, Bld 86 70 - 99 mg/dL   BUN 7 6 - 20 mg/dL   Creatinine, Ser 1.610.68 0.44 - 1.00 mg/dL   Calcium 8.9 8.9 - 09.610.3 mg/dL   Total Protein 5.9 (L) 6.5 - 8.1 g/dL   Albumin 3.1 (L) 3.5 - 5.0 g/dL   AST 12 (L) 15 - 41 U/L   ALT 11 0 - 44 U/L   Alkaline Phosphatase 45 38 - 126 U/L   Total Bilirubin 0.3 0.3 -  1.2 mg/dL   GFR calc non Af Amer >60 >60 mL/min   GFR calc Af Amer >60 >60 mL/min   Anion gap 8 5 - 15  Amylase     Status: None   Collection Time: 09/22/19  1:40 PM  Result Value Ref Range   Amylase 42 28 - 100 U/L  Lipase, blood     Status: None   Collection Time: 09/22/19  1:40 PM  Result Value Ref Range   Lipase 25 11 - 51 U/L  CBC     Status: Abnormal   Collection Time: 09/22/19  1:40 PM  Result Value Ref Range   WBC 10.6 (H) 4.0 - 10.5 K/uL   RBC 3.77 (L) 3.87 - 5.11 MIL/uL   Hemoglobin 11.7 (L) 12.0 - 15.0 g/dL   HCT 04.534.4 (L) 40.936.0 - 81.146.0 %   MCV 91.2 80.0 - 100.0 fL   MCH 31.0 26.0 - 34.0 pg   MCHC 34.0 30.0 - 36.0 g/dL   RDW 91.413.0 78.211.5 - 95.615.5 %   Platelets 282 150 - 400 K/uL   nRBC 0.0 0.0 - 0.2 %  Troponin I (High Sensitivity)     Status: None   Collection Time: 09/22/19  1:40 PM  Result Value Ref Range   Troponin I (High Sensitivity) 4 <18 ng/L    IMAGING No results found.  MAU COURSE Orders Placed This Encounter  Procedures  . Urinalysis, Routine w reflex microscopic  . Comprehensive metabolic panel  . Amylase  .  Lipase, blood  . CBC  . ED EKG  . Insert peripheral IV  . Discharge patient   Meds ordered this encounter  Medications  . AND Linked Order Group   . diphenhydrAMINE (BENADRYL) injection 25 mg   . dexamethasone (DECADRON) injection 10 mg  . famotidine (PEPCID) IVPB 20 mg premix  . lactated ringers bolus 1,000 mL  . prochlorperazine (COMPAZINE) injection 10 mg  . ondansetron (ZOFRAN-ODT) disintegrating tablet 8 mg  . AND Linked Order Group   . alum & mag hydroxide-simeth (MAALOX/MYLANTA) 200-200-20 MG/5ML suspension 30 mL   . lidocaine (XYLOCAINE) 2 % viscous mouth solution 15 mL    MDM FHT present via doppler  Pt with ongoing n/v. Has vomited 3 times today. Currently not her chief complaint & reports moderate control with current antiemetic regimen. Has gaine 2 kg since last week.   Chest pain has also been an ongoing issue that comes  & goes. Currently no palpitations or tachycardia. No other cardiac or pulmonary symptoms. Normal vital signs. EKG normal sinus rhythm. Had a cardiologist appt a few weeks ago & has follow up scheduled in a few months. Chest pain cardiac vs GI. Will give IV pepcid. CP unresolved with IV pepcid, given zofran & maalox/viscous lidocaine - pt reports improvement.  CBC, CMP, amylase, lipase, & troponin - normal.   Abdominal pain description consistent with round ligament pain.   Headache treated with headache cocktail (decadron, benadryl, compazine). She had a trigger point injection in MAU last week which helped with her symptoms. Per Dr. Salomon Mast, too soon for repeat injection.  Headache improved after meds.   ASSESSMENT 1. Chronic migraine without aura without status migrainosus, not intractable   2. Heartburn during pregnancy in second trimester   3. [redacted] weeks gestation of pregnancy     PLAN Discharge home in stable condition. Discussed reasons to return to MAU F/u with cardiologist per symptoms Continue at home medication regimen Keep f/u with headache specialist  Follow-up Information    [facility] Follow up.   Specialty: Obstetrics and Gynecology Why: return for worsening symptoms Contact information: [address] [id number] Wilhemina Bonito [place] [address] [phone]         Allergies as of 09/22/2019      Reactions   Nitrous Oxide Anaphylaxis   Peach Flavor Anaphylaxis   Shellfish Allergy Anaphylaxis   Adhesive [tape] Hives, Itching, Other (See Comments)   blisters   Reglan [metoclopramide]    Muscle spasms. Pt takes med at home      Medication List    TAKE these medications   cyclobenzaprine 10 MG tablet Commonly known as: FLEXERIL Take 1 tablet (10 mg total) by mouth 3 (three) times daily as needed for muscle spasms.   escitalopram 10 MG tablet Commonly known as: Lexapro Take 1 tablet (10 mg total) by mouth daily.    famotidine 20 MG tablet Commonly known as: PEPCID Take 1 tablet (20 mg total) by mouth 2 (two) times daily.   ferrous sulfate 325 (65 FE) MG tablet Take 325 mg by mouth 3 (three) times daily.   Magnesium Gluconate 550 MG Tabs TAKE 1 TABLET BY MOUTH 2 TIMES DAILY   metoCLOPramide 10 MG tablet Commonly known as: REGLAN Take 1 tablet (10 mg total) by mouth 3 (three) times daily before meals.   prenatal multivitamin Tabs tablet Take 1 tablet by mouth daily at 12 noon.   promethazine 12.5 MG tablet Commonly known as: PHENERGAN Take 1 tablet (  12.5 mg total) by mouth at bedtime as needed for nausea or vomiting.   propranolol 10 MG tablet Commonly known as: INDERAL TAKE 1 TABLET BY MOUTH DAILY AS NEEDED What changed: reasons to take this   zolpidem 10 MG tablet Commonly known as: AMBIEN Take 10 mg by mouth at bedtime. What changed: Another medication with the same name was removed. Continue taking this medication, and follow the directions you see here.        Judeth Horn, NP 09/22/2019  4:47 PM

## 2019-09-22 NOTE — Progress Notes (Signed)
   PRENATAL VISIT NOTE  Subjective:  Emilee Market is a 28 y.o. G1P0000 at [redacted]w[redacted]d being seen today for ongoing prenatal care.  She is currently monitored for the following issues for this high-risk pregnancy and has Tachycardia; Supervision of normal first pregnancy, antepartum; Inappropriate sinus tachycardia; Allergic rhinitis; Chronic migraine without aura without status migrainosus, not intractable; Mild intermittent asthma without complication; Shift work sleep disorder; and Hyperemesis gravidarum on their problem list.  Patient reports Continues to have fairly extensive nasuea and vomiting. 2 days a week where she feels good with minimal symptoms, but even then is only able to eat small amounts. Other days, she is extensively sick. Feels better after IVF when she comes to MAU. She is also having daily migraines. Headache coctail helpful for about a day or so. Injections in MAU helped for a brief period..  Contractions: Not present. Vag. Bleeding: None.  Movement: Absent. Denies leaking of fluid.   The following portions of the patient's history were reviewed and updated as appropriate: allergies, current medications, past family history, past medical history, past social history, past surgical history and problem list. Problem list updated.  Objective:   Vitals:   09/22/19 0953  BP: (!) 105/58  Pulse: 83  Weight: 159 lb (72.1 kg)    Fetal Status: Fetal Heart Rate (bpm): 140   Movement: Absent     General:  Alert, oriented and cooperative. Patient is in no acute distress.  Skin: Skin is warm and dry. No rash noted.   Cardiovascular: Normal heart rate noted  Respiratory: Normal respiratory effort, no problems with respiration noted  Abdomen: Soft, gravid, appropriate for gestational age. Pain/Pressure: Present     Pelvic:  Cervical exam deferred        MSK: Restriction, tenderness, tissue texture changes, and paraspinal spasm in the cervical spine  Neuro: Moves all four  extremities with no focal neurological deficit  Extremities: Normal range of motion.  Edema: Trace  Mental Status: Normal mood and affect. Normal behavior. Normal judgment and thought content.   OSE: Head OA SRRL  Cervical C5 FSRR  Thoracic T2 FSRR, T4 FSRL  Rib rib2 inhaled  Lumbar   Sacrum   Pelvis     Assessment and Plan:  Pregnancy: G1P0000 at [redacted]w[redacted]d  1. Supervision of normal first pregnancy, antepartum FHT and FH normal - AFP, Serum, Open Spina Bifida  2. Hyperemesis gravidarum Currently taking reglan and phenergan. This appears to work the best for her.   3. Chronic migraine without aura without status migrainosus, not intractable Toradol injection 30mg  given Patient to see Allie Dimmer, PA-C to help with migraines. OMT as below  4. Inappropriate sinus tachycardia propranolol  5. Somatic dysfunction of head region 6. Somatic dysfunction of spine, cervical 7. Somatic dysfunction of spine, thoracic 8. Somatic dysfunction of rib OMT done after patient permission. HVLA technique utilized. 4 areas treated with improvement of tissue texture and joint mobility. Patient tolerated procedure well.    Preterm labor symptoms and general obstetric precautions including but not limited to vaginal bleeding, contractions, leaking of fluid and fetal movement were reviewed in detail with the patient. Please refer to After Visit Summary for other counseling recommendations.  No follow-ups on file.  Truett Mainland, DO

## 2019-09-23 MED ORDER — PROMETHAZINE HCL 25 MG RE SUPP: 25.0000 mg | Freq: Four times a day (QID) | RECTAL | 5 refills | Status: DC | PRN

## 2019-09-25 NOTE — Telephone Encounter (Signed)
Can you or Marva call her with the Fax number for short term disability paperwork.

## 2019-09-25 NOTE — Telephone Encounter (Signed)
Can you look into Home Health for her:  1L LR with phenergan 25mg  in the bag of LR three times a week. Thanks!

## 2019-09-26 ENCOUNTER — Other Ambulatory Visit: Payer: Self-pay | Admitting: Family Medicine

## 2019-09-28 ENCOUNTER — Encounter (HOSPITAL_BASED_OUTPATIENT_CLINIC_OR_DEPARTMENT_OTHER): Payer: Self-pay | Admitting: Emergency Medicine

## 2019-09-28 ENCOUNTER — Other Ambulatory Visit: Payer: Self-pay

## 2019-09-28 ENCOUNTER — Emergency Department (HOSPITAL_BASED_OUTPATIENT_CLINIC_OR_DEPARTMENT_OTHER)
Admission: EM | Admit: 2019-09-28 | Discharge: 2019-09-28 | Disposition: A | Discharge status: Home / Self Care | Payer: 59 | Attending: Emergency Medicine | Admitting: Emergency Medicine

## 2019-09-28 DIAGNOSIS — J45909 Unspecified asthma, uncomplicated: Secondary | ICD-10-CM | POA: Diagnosis not present

## 2019-09-28 DIAGNOSIS — Z79899 Other long term (current) drug therapy: Secondary | ICD-10-CM | POA: Insufficient documentation

## 2019-09-28 DIAGNOSIS — R519 Headache, unspecified: Secondary | ICD-10-CM | POA: Insufficient documentation

## 2019-09-28 DIAGNOSIS — R110 Nausea: Secondary | ICD-10-CM | POA: Diagnosis not present

## 2019-09-28 DIAGNOSIS — Z3A17 17 weeks gestation of pregnancy: Secondary | ICD-10-CM | POA: Diagnosis not present

## 2019-09-28 DIAGNOSIS — O26892 Other specified pregnancy related conditions, second trimester: Secondary | ICD-10-CM | POA: Insufficient documentation

## 2019-09-28 LAB — CBC
HCT: 33.5 % — ABNORMAL LOW (ref 36.0–46.0)
Hemoglobin: 11.3 g/dL — ABNORMAL LOW (ref 12.0–15.0)
MCH: 31.1 pg (ref 26.0–34.0)
MCHC: 33.7 g/dL (ref 30.0–36.0)
MCV: 92.3 fL (ref 80.0–100.0)
Platelets: 256 10*3/uL (ref 150–400)
RBC: 3.63 MIL/uL — ABNORMAL LOW (ref 3.87–5.11)
RDW: 13.2 % (ref 11.5–15.5)
WBC: 10.2 10*3/uL (ref 4.0–10.5)
nRBC: 0 % (ref 0.0–0.2)

## 2019-09-28 LAB — COMPREHENSIVE METABOLIC PANEL
ALT: 9 U/L (ref 0–44)
AST: 13 U/L — ABNORMAL LOW (ref 15–41)
Albumin: 2.9 g/dL — ABNORMAL LOW (ref 3.5–5.0)
Alkaline Phosphatase: 42 U/L (ref 38–126)
Anion gap: 6 (ref 5–15)
BUN: 5 mg/dL — ABNORMAL LOW (ref 6–20)
CO2: 21 mmol/L — ABNORMAL LOW (ref 22–32)
Calcium: 8 mg/dL — ABNORMAL LOW (ref 8.9–10.3)
Chloride: 112 mmol/L — ABNORMAL HIGH (ref 98–111)
Creatinine, Ser: 0.59 mg/dL (ref 0.44–1.00)
GFR calc Af Amer: >60 mL/min (ref >60)
GFR calc non Af Amer: >60 mL/min (ref >60)
Glucose, Bld: 82 mg/dL (ref 70–99)
Potassium: 4.1 mmol/L (ref 3.5–5.1)
Sodium: 139 mmol/L (ref 135–145)
Total Bilirubin: 0.3 mg/dL (ref 0.3–1.2)
Total Protein: 5.6 g/dL — ABNORMAL LOW (ref 6.5–8.1)

## 2019-09-28 MED ORDER — PROMETHAZINE HCL 25 MG/ML IJ SOLN
INTRAMUSCULAR | Status: AC
  Filled 2019-09-28: qty 1

## 2019-09-28 MED ORDER — ZOLPIDEM TARTRATE 5 MG PO TABS: 5.0000 mg | ORAL_TABLET | Freq: Every evening | ORAL | 0 refills | Status: DC | PRN

## 2019-09-28 MED ORDER — MAGNESIUM SULFATE IN D5W 1-5 GM/100ML-% IV SOLN
1.0000 g | Freq: Once | INTRAVENOUS | Status: AC
  Administered 2019-09-28: 09:00:00 1 g via INTRAVENOUS
  Filled 2019-09-28: qty 100

## 2019-09-28 MED ORDER — FENTANYL CITRATE (PF) 100 MCG/2ML IJ SOLN
50.0000 ug | Freq: Once | INTRAMUSCULAR | Status: AC
  Administered 2019-09-28: 50 ug via INTRAVENOUS
  Filled 2019-09-28: qty 2

## 2019-09-28 MED ORDER — PROMETHAZINE HCL 25 MG/ML IJ SOLN
25.0000 mg | Freq: Once | INTRAMUSCULAR | Status: AC
  Administered 2019-09-28: 09:00:00 25 mg via INTRAVENOUS

## 2019-09-28 MED ORDER — SODIUM CHLORIDE 0.9 % IV BOLUS
1000.0000 mL | Freq: Once | INTRAVENOUS | Status: AC
  Administered 2019-09-28: 1000 mL via INTRAVENOUS

## 2019-09-28 NOTE — ED Provider Notes (Signed)
[facility] EMERGENCY DEPARTMENT Provider Note   CSN: [id number] Arrival date & time: 09/28/19  [id number]     History   Chief Complaint Chief Complaint  Patient presents with  . Headache    [redacted] weeks pregnant    HPI Michelle Vincent is a 28 y.o. female.     HPI Patient presents with headache.  Throbbing on left side of her head.  Like previous migraines.  Has been on chronic treatment for.  He has however also [redacted] weeks pregnant.  Has had recent treatment.  Had been on prophylactic medicines at home before pregnancy but has been stopped due to pregnancy.  Has had more frequent pains.  States that she has been unable to sleep for the last 2 days and thinks that is making the pain worse.  No numbness or weakness.  Does somewhat center on her left eye also..  Patient has follow-up with neurologist that specializes in OB during this week. Past Medical History:  Diagnosis Date  . Anemia   . Anxiety    takes lexapro  . Asthma    prn inhaler use  . HA (headache)   . Occipital neuralgia   . Sinus tachycardia   . SVT (supraventricular tachycardia) [facility](HCC)     Patient Active Problem List   Diagnosis Date Noted  . Hyperemesis gravidarum 08/29/2019  . Supervision of normal first pregnancy, antepartum 08/12/2019  . Allergic rhinitis 08/12/2019  . Tachycardia 03/03/2019  . 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 Mild intermittent asthma without complication 12/27/2017  . Chronic migraine without aura without status migrainosus, not intractable 05/24/2017  . 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 shoulder Right   . TONSILLECTOMY AND ADENOIDECTOMY       OB History    Gravida  1   Para  0   Term  0   Preterm  0   AB  0   Living  0     SAB  0   TAB  0   Ectopic  0   Multiple  0   Live Births  0            Home Medications    Prior to Admission medications   Medication Sig Start Date  End Date Taking? Authorizing Provider  cyclobenzaprine (FLEXERIL) 10 MG tablet Take 1 tablet (10 mg total) by mouth 3 (three) times daily as needed for muscle spasms. 09/15/19   Rasch, Victorino DikeJennifer I, NP  escitalopram (LEXAPRO) 10 MG tablet Take 1 tablet (10 mg total) by mouth daily. 09/09/19   Levie HeritageStinson, Jacob J, DO  famotidine (PEPCID) 20 MG tablet Take 1 tablet (20 mg total) by mouth 2 (two) times daily. 09/09/19   Levie HeritageStinson, Jacob J, DO  ferrous sulfate 325 (65 FE) MG tablet Take 325 mg by mouth 3 (three) times daily. 12/04/14   [provider]  Magnesium Gluconate 550 MG TABS TAKE 1 TABLET BY MOUTH 2 TIMES DAILY 09/01/19   Levie HeritageStinson, Jacob J, DO  metoCLOPramide (REGLAN) 10 MG tablet Take 1 tablet (10 mg total) by mouth 3 (three) times daily before meals. 09/15/19   Rasch, Harolyn RutherfordJennifer I, NP  Prenatal Vit-Fe Fumarate-FA (PRENATAL MULTIVITAMIN) TABS tablet Take 1 tablet by mouth daily at 12 noon.    [provider]  promethazine (PHENERGAN) 25 MG suppository Place 1 suppository (25 mg total) rectally every 6 (six) hours as needed for nausea or vomiting. 09/23/19   Stinson,  Rhona Raider, DO  propranolol (INDERAL) 10 MG tablet TAKE 1 TABLET BY MOUTH DAILY AS NEEDED Patient taking differently: Take 10 mg by mouth daily as needed (svt).  04/15/19   Rollene Rotunda, MD  zolpidem (AMBIEN) 10 MG tablet Take 10 mg by mouth at bedtime. 04/30/19   [provider]    Family History Family History  Problem Relation Age of Onset  . Hypertension Mother   . Transient ischemic attack Mother   . Diabetes Brother        Type 1  . Breast cancer Maternal Aunt   . Cancer Maternal Aunt     Social History Social History   Tobacco Use  . Smoking status: Never Smoker  . Smokeless tobacco: Never Used  Substance Use Topics  . Alcohol use: Not Currently    Alcohol/week: 1.0 standard drinks    Types: 1 Glasses of wine per week    Frequency: Never  . Drug use: Never     Allergies   Nitrous oxide, Peach  flavor, Shellfish allergy, Adhesive [tape], and Reglan [metoclopramide]   Review of Systems Review of Systems  Constitutional: Negative for appetite change.  Respiratory: Negative for shortness of breath.   Gastrointestinal: Positive for nausea.  Genitourinary: Negative for dysuria.  Musculoskeletal: Negative for back pain.  Skin: Negative for rash.  Neurological: Positive for headaches.  Psychiatric/Behavioral: Negative for confusion.     Physical Exam Updated Vital Signs BP 117/72 (BP Location: Right Arm)   Pulse 69   Temp 98.2 F (36.8 C) (Oral)   Resp 18   Ht 5\' 7"  (1.702 m)   Wt 69.9 kg   LMP 06/01/2019   SpO2 99%   BMI 24.12 kg/m   Physical Exam Vitals signs reviewed.  HENT:     Head: Normocephalic.  Eyes:     Pupils: Pupils are equal, round, and reactive to light.  Neck:     Musculoskeletal: Neck supple.  Cardiovascular:     Rate and Rhythm: Regular rhythm.  Pulmonary:     Effort: Pulmonary effort is normal.  Abdominal:     Palpations: Abdomen is soft.  Neurological:     Mental Status: She is alert.  Psychiatric:        Mood and Affect: Mood normal.      ED Treatments / Results  Labs (all labs ordered are listed, but only abnormal results are displayed) Labs Reviewed  COMPREHENSIVE METABOLIC PANEL  CBC    EKG None  Radiology No results found.  Procedures Procedures (including critical care time)  Medications Ordered in ED Medications  fentaNYL (SUBLIMAZE) injection 50 mcg (has no administration in time range)  promethazine (PHENERGAN) injection 25 mg (25 mg Intravenous Given 09/28/19 0911)  sodium chloride 0.9 % bolus 1,000 mL (0 mLs Intravenous Stopped 09/28/19 1022)  magnesium sulfate IVPB 1 g 100 mL (0 g Intravenous Stopped 09/28/19 1022)  fentaNYL (SUBLIMAZE) injection 50 mcg (50 mcg Intravenous Given 09/28/19 1026)     Initial Impression / Assessment and Plan / ED Course  I have reviewed the triage vital signs and the nursing  notes.  Pertinent labs & imaging results that were available during my care of the patient were reviewed by me and considered in my medical decision making (see chart for details).        Patient with headaches.  History of same.  Throbbing.  Has had pretty constantly over the last few days.  Has been treated as an outpatient without consistent relief.  Treated here with Phenergan magnesium and some fentanyl along with IV fluids.  Continued rather severe headache.  With the intractable nature of the headache I feels the patient would benefit from admission to the hospital for further treatment and neurology consultation.  Discussed with Dr. Cheral Marker from neurology.  Feels there is not much benefit that will come from an inpatient admission.  We have tried most of the treatments already.  Recommended some sleep aid.  Patient is already taken Benadryl and will add on some Ambien now.  Patient had headache previously but appears not currently on it.  Has neurology follow-up.  Discharge home  Final Clinical Impressions(s) / ED Diagnoses   Final diagnoses:  Status migrainosus    ED Discharge Orders    None       Davonna Belling, MD 09/28/19 1231

## 2019-09-28 NOTE — ED Notes (Signed)
Patient still in pain

## 2019-09-28 NOTE — ED Triage Notes (Addendum)
Headache x 3 days with nausea and insomnia. Also endorses nausea. She is [redacted] weeks pregnant.

## 2019-09-28 NOTE — Discharge Instructions (Signed)
Follow-up with the neurologist as planned 

## 2019-09-29 ENCOUNTER — Emergency Department (HOSPITAL_BASED_OUTPATIENT_CLINIC_OR_DEPARTMENT_OTHER)
Admission: EM | Admit: 2019-09-29 | Discharge: 2019-09-29 | Disposition: A | Discharge status: Home / Self Care | Payer: 59 | Attending: Emergency Medicine | Admitting: Emergency Medicine

## 2019-09-29 ENCOUNTER — Other Ambulatory Visit: Payer: Self-pay

## 2019-09-29 ENCOUNTER — Encounter (HOSPITAL_BASED_OUTPATIENT_CLINIC_OR_DEPARTMENT_OTHER): Payer: Self-pay | Admitting: Emergency Medicine

## 2019-09-29 DIAGNOSIS — G43801 Other migraine, not intractable, with status migrainosus: Secondary | ICD-10-CM

## 2019-09-29 DIAGNOSIS — G43909 Migraine, unspecified, not intractable, without status migrainosus: Secondary | ICD-10-CM | POA: Diagnosis not present

## 2019-09-29 DIAGNOSIS — Z3A17 17 weeks gestation of pregnancy: Secondary | ICD-10-CM | POA: Insufficient documentation

## 2019-09-29 DIAGNOSIS — O99891 Other specified diseases and conditions complicating pregnancy: Secondary | ICD-10-CM | POA: Insufficient documentation

## 2019-09-29 HISTORY — DX: Migraine, unspecified, not intractable, without status migrainosus: G43.909

## 2019-09-29 LAB — BASIC METABOLIC PANEL
Anion gap: 6 (ref 5–15)
BUN: 6 mg/dL (ref 6–20)
CO2: 20 mmol/L — ABNORMAL LOW (ref 22–32)
Calcium: 8.4 mg/dL — ABNORMAL LOW (ref 8.9–10.3)
Chloride: 108 mmol/L (ref 98–111)
Creatinine, Ser: 0.54 mg/dL (ref 0.44–1.00)
GFR calc Af Amer: >60 mL/min (ref >60)
GFR calc non Af Amer: >60 mL/min (ref >60)
Glucose, Bld: 92 mg/dL (ref 70–99)
Potassium: 3.8 mmol/L (ref 3.5–5.1)
Sodium: 134 mmol/L — ABNORMAL LOW (ref 135–145)

## 2019-09-29 LAB — CBC WITH DIFFERENTIAL/PLATELET
Abs Immature Granulocytes: 0.07 10*3/uL (ref 0.00–0.07)
Basophils Absolute: 0 10*3/uL (ref 0.0–0.1)
Basophils Relative: 0 %
Eosinophils Absolute: 0.4 10*3/uL (ref 0.0–0.5)
Eosinophils Relative: 4 %
HCT: 33.6 % — ABNORMAL LOW (ref 36.0–46.0)
Hemoglobin: 11.6 g/dL — ABNORMAL LOW (ref 12.0–15.0)
Immature Granulocytes: 1 %
Lymphocytes Relative: 15 %
Lymphs Abs: 1.5 10*3/uL (ref 0.7–4.0)
MCH: 31.5 pg (ref 26.0–34.0)
MCHC: 34.5 g/dL (ref 30.0–36.0)
MCV: 91.3 fL (ref 80.0–100.0)
Monocytes Absolute: 0.6 10*3/uL (ref 0.1–1.0)
Monocytes Relative: 6 %
Neutro Abs: 7.4 10*3/uL (ref 1.7–7.7)
Neutrophils Relative %: 74 %
Platelets: 243 10*3/uL (ref 150–400)
RBC: 3.68 MIL/uL — ABNORMAL LOW (ref 3.87–5.11)
RDW: 13.1 % (ref 11.5–15.5)
WBC: 9.8 10*3/uL (ref 4.0–10.5)
nRBC: 0 % (ref 0.0–0.2)

## 2019-09-29 MED ORDER — PROMETHAZINE HCL 25 MG/ML IJ SOLN
25.0000 mg | Freq: Once | INTRAMUSCULAR | Status: AC
  Administered 2019-09-29: 25 mg via INTRAVENOUS
  Filled 2019-09-29: qty 1

## 2019-09-29 MED ORDER — DIPHENHYDRAMINE HCL 50 MG/ML IJ SOLN
12.5000 mg | Freq: Once | INTRAMUSCULAR | Status: AC
  Administered 2019-09-29: 12.5 mg via INTRAVENOUS
  Filled 2019-09-29: qty 1

## 2019-09-29 MED ORDER — SODIUM CHLORIDE 0.9 % IV BOLUS
1000.0000 mL | Freq: Once | INTRAVENOUS | Status: AC
  Administered 2019-09-29: 1000 mL via INTRAVENOUS

## 2019-09-29 MED ORDER — FENTANYL CITRATE (PF) 100 MCG/2ML IJ SOLN
50.0000 ug | Freq: Once | INTRAMUSCULAR | Status: AC
  Administered 2019-09-29: 50 ug via INTRAVENOUS
  Filled 2019-09-29: qty 2

## 2019-09-29 MED ORDER — MAGNESIUM 400 MG PO TABS: 400.0000 mg | ORAL_TABLET | Freq: Every day | ORAL | 0 refills | Status: DC

## 2019-09-29 MED ORDER — DIPHENHYDRAMINE HCL 50 MG/ML IJ SOLN
25.0000 mg | Freq: Once | INTRAMUSCULAR | Status: AC
  Administered 2019-09-29: 12:00:00 via INTRAVENOUS
  Filled 2019-09-29: qty 1

## 2019-09-29 MED ORDER — SODIUM CHLORIDE 0.9 % IV SOLN
INTRAVENOUS | Status: DC
  Administered 2019-09-29: 12:00:00 via INTRAVENOUS

## 2019-09-29 MED ORDER — KETOROLAC TROMETHAMINE 30 MG/ML IJ SOLN
30.0000 mg | Freq: Once | INTRAMUSCULAR | Status: AC
  Administered 2019-09-29: 30 mg via INTRAVENOUS
  Filled 2019-09-29: qty 1

## 2019-09-29 MED ORDER — METOCLOPRAMIDE HCL 5 MG/ML IJ SOLN
10.0000 mg | Freq: Once | INTRAMUSCULAR | Status: AC
  Administered 2019-09-29: 10 mg via INTRAVENOUS
  Filled 2019-09-29: qty 2

## 2019-09-29 NOTE — ED Provider Notes (Signed)
[facility]PART[facility] Provider Note   CSN: [id number] Arrival date & time: 09/29/19  0906     History   Chief Complaint Chief Complaint  Patient presents with  . Headache    HPI Michelle Vincent is a 28 y.o. female.     Pt presents to the ED today with headache and vomiting.  She has a hx of migraines and is [redacted] weeks pregnant.  She has had multiple visits to the MAU and to ED for headache.  She said the pain subsides a little bit, but it never goes away.  She was seen in the ED yesterday and felt a little better.  She was still having pain, so she was d/w Dr. Cheral Marker who did not feel like pt would benefit from an inpatient admission.  The pt said she has continued to vomit and headache is now on the right side of the head.  She denies any f/c.  No known covid exposures.  She has an appointment with a neurologist who specializes in OB patients on 12/4.     Past Medical History:  Diagnosis Date  . Anemia   . Anxiety    takes lexapro  . Asthma    prn inhaler use  . HA (headache)   . Migraine   . Occipital neuralgia   . Sinus tachycardia   . SVT (supraventricular tachycardia) [facility])     Patient Active Problem List   Diagnosis Date Noted  . Hyperemesis gravidarum 08/29/2019  . Supervision of normal first pregnancy, antepartum 08/12/2019  . Allergic rhinitis 08/12/2019  . Tachycardia 03/03/2019  . 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 Mild intermittent asthma without complication 13/24/4010  . Chronic migraine without aura without status migrainosus, not intractable 05/24/2017  . 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 shoulder Right   . TONSILLECTOMY AND ADENOIDECTOMY       OB History    Gravida  1   Para  0   Term  0   Preterm  0   AB  0   Living  0     SAB  0   TAB  0   Ectopic  0   Multiple  0   Live Births  0            Home Medications    Prior  to Admission medications   Medication Sig Start Date End Date Taking? Authorizing Provider  cyclobenzaprine (FLEXERIL) 10 MG tablet Take 1 tablet (10 mg total) by mouth 3 (three) times daily as needed for muscle spasms. 09/15/19   Rasch, Anderson Malta I, NP  escitalopram (LEXAPRO) 10 MG tablet Take 1 tablet (10 mg total) by mouth daily. 09/09/19   Truett Mainland, DO  famotidine (PEPCID) 20 MG tablet Take 1 tablet (20 mg total) by mouth 2 (two) times daily. 09/09/19   Truett Mainland, DO  ferrous sulfate 325 (65 FE) MG tablet Take 325 mg by mouth 3 (three) times daily. 12/04/14   [provider]  Magnesium 400 MG TABS Take 400 mg by mouth daily. 09/29/19   Isla Pence, MD  Magnesium Gluconate 550 MG TABS TAKE 1 TABLET BY MOUTH 2 TIMES DAILY 09/01/19   Truett Mainland, DO  metoCLOPramide (REGLAN) 10 MG tablet Take 1 tablet (10 mg total) by mouth 3 (three) times daily before meals. 09/15/19   Rasch, Artist Pais, NP  Prenatal Vit-Fe Fumarate-FA (PRENATAL MULTIVITAMIN) TABS tablet  Take 1 tablet by mouth daily at 12 noon.    [provider]  promethazine (PHENERGAN) 25 MG suppository Place 1 suppository (25 mg total) rectally every 6 (six) hours as needed for nausea or vomiting. 09/23/19   Levie HeritageStinson, Jacob J, DO  propranolol (INDERAL) 10 MG tablet TAKE 1 TABLET BY MOUTH DAILY AS NEEDED Patient taking differently: Take 10 mg by mouth daily as needed (svt).  04/15/19   Rollene RotundaHochrein, James, MD  zolpidem (AMBIEN) 5 MG tablet Take 1 tablet (5 mg total) by mouth at bedtime as needed for sleep. 09/28/19   Benjiman CorePickering, Nathan, MD    Family History Family History  Problem Relation Age of Onset  . Hypertension Mother   . Transient ischemic attack Mother   . Diabetes Brother        Type 1  . Breast cancer Maternal Aunt   . Cancer Maternal Aunt     Social History Social History   Tobacco Use  . Smoking status: Never Smoker  . Smokeless tobacco: Never Used  Substance Use Topics  . Alcohol use:  Not Currently    Alcohol/week: 1.0 standard drinks    Types: 1 Glasses of wine per week    Frequency: Never  . Drug use: Never     Allergies   Nitrous oxide, Peach flavor, Shellfish allergy, Adhesive [tape], and Reglan [metoclopramide]   Review of Systems Review of Systems  Gastrointestinal: Positive for nausea and vomiting.  Neurological: Positive for headaches.  All other systems reviewed and are negative.    Physical Exam Updated Vital Signs BP 116/66   Pulse (!) 59   Temp 98.1 F (36.7 C) (Oral)   Resp 16   Ht 5\' 7"  (1.702 m)   Wt 69.9 kg   LMP 06/01/2019   SpO2 100%   BMI 24.12 kg/m   Physical Exam Vitals signs and nursing note reviewed.  Constitutional:      Appearance: She is well-developed.  HENT:     Head: Normocephalic and atraumatic.     Mouth/Throat:     Mouth: Mucous membranes are moist.  Eyes:     Extraocular Movements: Extraocular movements intact.     Pupils: Pupils are equal, round, and reactive to light.  Neck:     Musculoskeletal: Normal range of motion and neck supple.  Cardiovascular:     Rate and Rhythm: Normal rate and regular rhythm.  Pulmonary:     Effort: Pulmonary effort is normal.     Breath sounds: Normal breath sounds.  Abdominal:     General: Bowel sounds are normal.     Palpations: Abdomen is soft.  Musculoskeletal: Normal range of motion.  Skin:    General: Skin is warm and dry.     Capillary Refill: Capillary refill takes less than 2 seconds.  Neurological:     Mental Status: She is alert and oriented to person, place, and time.  Psychiatric:        Mood and Affect: Mood normal.        Speech: Speech normal.        Behavior: Behavior normal.      ED Treatments / Results  Labs (all labs ordered are listed, but only abnormal results are displayed) Labs Reviewed  BASIC METABOLIC PANEL - Abnormal; Notable for the following components:      Result Value   Sodium 134 (*)    CO2 20 (*)    Calcium 8.4 (*)    All  other components within normal  limits  CBC WITH DIFFERENTIAL/PLATELET - Abnormal; Notable for the following components:   RBC 3.68 (*)    Hemoglobin 11.6 (*)    HCT 33.6 (*)    All other components within normal limits  URINALYSIS, ROUTINE W REFLEX MICROSCOPIC    EKG None  Radiology No results found.  Procedures Procedures (including critical care time)  Medications Ordered in ED Medications  sodium chloride 0.9 % bolus 1,000 mL (0 mLs Intravenous Stopped 09/29/19 1102)    And  0.9 %  sodium chloride infusion ( Intravenous New Bag/Given 09/29/19 1146)  diphenhydrAMINE (BENADRYL) injection 12.5 mg (12.5 mg Intravenous Given 09/29/19 0944)  promethazine (PHENERGAN) injection 25 mg (25 mg Intravenous Given 09/29/19 0944)  fentaNYL (SUBLIMAZE) injection 50 mcg (50 mcg Intravenous Given 09/29/19 0946)  sodium chloride 0.9 % bolus 1,000 mL (0 mLs Intravenous Stopped 09/29/19 1146)  ketorolac (TORADOL) 30 MG/ML injection 30 mg (30 mg Intravenous Given 09/29/19 1147)  metoCLOPramide (REGLAN) injection 10 mg (10 mg Intravenous Given 09/29/19 1150)  diphenhydrAMINE (BENADRYL) injection 25 mg ( Intravenous Given 09/29/19 1148)     Initial Impression / Assessment and Plan / ED Course  I have reviewed the triage vital signs and the nursing notes.  Pertinent labs & imaging results that were available during my care of the patient were reviewed by me and considered in my medical decision making (see chart for details).       Pain is now down to a 2.  I did call Dr. Amada Jupiter (neurology) to see if he had any other recommendations.  He did not think an admission would be helpful.  He said for her to take magnesium.  Pt does not need any more nausea meds.  She knows to return if worse and to f/u with her neurologist as scheduled.  Final Clinical Impressions(s) / ED Diagnoses   Final diagnoses:  Other migraine with status migrainosus, not intractable  [redacted] weeks gestation of pregnancy     ED Discharge Orders         Ordered    Magnesium 400 MG TABS  Daily     09/29/19 1344           Jacalyn Lefevre, MD 09/29/19 1347

## 2019-09-29 NOTE — ED Triage Notes (Signed)
Pt on 3rd day of headache,  Seen yesterday for same.

## 2019-09-30 ENCOUNTER — Encounter (HOSPITAL_COMMUNITY): Payer: Self-pay | Admitting: *Deleted

## 2019-09-30 ENCOUNTER — Inpatient Hospital Stay (HOSPITAL_COMMUNITY)
Admission: AD | Admit: 2019-09-30 | Discharge: 2019-09-30 | Disposition: A | Discharge status: Home / Self Care | Payer: 59 | Attending: Obstetrics and Gynecology | Admitting: Obstetrics and Gynecology

## 2019-09-30 ENCOUNTER — Other Ambulatory Visit: Payer: Self-pay

## 2019-09-30 DIAGNOSIS — Z79899 Other long term (current) drug therapy: Secondary | ICD-10-CM | POA: Insufficient documentation

## 2019-09-30 DIAGNOSIS — Z3A17 17 weeks gestation of pregnancy: Secondary | ICD-10-CM | POA: Diagnosis not present

## 2019-09-30 DIAGNOSIS — F419 Anxiety disorder, unspecified: Secondary | ICD-10-CM | POA: Insufficient documentation

## 2019-09-30 DIAGNOSIS — O99512 Diseases of the respiratory system complicating pregnancy, second trimester: Secondary | ICD-10-CM | POA: Diagnosis not present

## 2019-09-30 DIAGNOSIS — Z3400 Encounter for supervision of normal first pregnancy, unspecified trimester: Secondary | ICD-10-CM

## 2019-09-30 DIAGNOSIS — O99342 Other mental disorders complicating pregnancy, second trimester: Secondary | ICD-10-CM | POA: Diagnosis not present

## 2019-09-30 DIAGNOSIS — O99352 Diseases of the nervous system complicating pregnancy, second trimester: Secondary | ICD-10-CM | POA: Diagnosis present

## 2019-09-30 DIAGNOSIS — G43109 Migraine with aura, not intractable, without status migrainosus: Secondary | ICD-10-CM | POA: Diagnosis not present

## 2019-09-30 DIAGNOSIS — O99012 Anemia complicating pregnancy, second trimester: Secondary | ICD-10-CM | POA: Insufficient documentation

## 2019-09-30 DIAGNOSIS — D649 Anemia, unspecified: Secondary | ICD-10-CM | POA: Diagnosis not present

## 2019-09-30 DIAGNOSIS — J45909 Unspecified asthma, uncomplicated: Secondary | ICD-10-CM | POA: Insufficient documentation

## 2019-09-30 DIAGNOSIS — O26892 Other specified pregnancy related conditions, second trimester: Secondary | ICD-10-CM

## 2019-09-30 DIAGNOSIS — O219 Vomiting of pregnancy, unspecified: Secondary | ICD-10-CM | POA: Insufficient documentation

## 2019-09-30 LAB — URINALYSIS, ROUTINE W REFLEX MICROSCOPIC
Bilirubin Urine: NEGATIVE
Glucose, UA: NEGATIVE mg/dL
Hgb urine dipstick: NEGATIVE
Ketones, ur: NEGATIVE mg/dL
Leukocytes,Ua: NEGATIVE
Nitrite: NEGATIVE
Protein, ur: NEGATIVE mg/dL
Specific Gravity, Urine: 1.008 (ref 1.005–1.030)
pH: 6 (ref 5.0–8.0)

## 2019-09-30 MED ORDER — DEXAMETHASONE SODIUM PHOSPHATE 10 MG/ML IJ SOLN
10.0000 mg | Freq: Once | INTRAMUSCULAR | Status: AC
  Administered 2019-09-30: 10 mg via INTRAVENOUS
  Filled 2019-09-30: qty 1

## 2019-09-30 MED ORDER — DIPHENHYDRAMINE HCL 50 MG/ML IJ SOLN
12.5000 mg | Freq: Once | INTRAMUSCULAR | Status: AC
  Administered 2019-09-30: 12.5 mg via INTRAVENOUS
  Filled 2019-09-30: qty 1

## 2019-09-30 MED ORDER — KETOROLAC TROMETHAMINE 30 MG/ML IJ SOLN
30.0000 mg | Freq: Once | INTRAMUSCULAR | Status: AC
  Administered 2019-09-30: 30 mg via INTRAVENOUS
  Filled 2019-09-30: qty 1

## 2019-09-30 MED ORDER — METOCLOPRAMIDE HCL 5 MG/ML IJ SOLN: 10.0000 mg | Freq: Once | INTRAMUSCULAR | Status: DC

## 2019-09-30 MED ORDER — LACTATED RINGERS IV BOLUS
1000.0000 mL | Freq: Once | INTRAVENOUS | Status: AC
  Administered 2019-09-30: 1000 mL via INTRAVENOUS

## 2019-09-30 MED ORDER — METOCLOPRAMIDE HCL 10 MG PO TABS
10.0000 mg | ORAL_TABLET | Freq: Once | ORAL | Status: AC
  Administered 2019-09-30: 10 mg via ORAL
  Filled 2019-09-30: qty 1

## 2019-09-30 NOTE — MAU Note (Signed)
Has migraine, has had about 4 days.  Light sensitive.  Having nausea and vomiting.

## 2019-09-30 NOTE — MAU Provider Note (Signed)
History     CSN: [id number]  Arrival date and time: 09/30/19 1132   First Provider Initiated Contact with Patient 09/30/19 1339      Chief Complaint  Patient presents with  . Migraine  . Headache  . Nausea  . Emesis   Ms. Michelle Vincent is a 28 y.o. G1P0000 at [id number] who presents to MAU for migraine. Pt reports she did not sleep on Friday and Saturday nights this week, which she believes triggered the migraine. Pt [facility] and Monday for HA treatment and reports that they were able to get the migraine pain mostly relieved with fentanyl, phenergan and toradol. Pt also reports she was given a magnesium drip, but this made her HA worse with a new throbbing sensation. Pt reports she was only discharged home with magnesium tablets.  Pt reports a history of migraines, beginning "a few years ago" and reports they were under control until she got pregnant, when her neurologist removed her from all her medications. Pt reports she has an appointment with a HA clinic on 10/10/2019.  Onset: 4days ago Location: frontal HA, right-side Duration: 4days Character: pulsating, sharp Aggravating/Associated: movement, lights/blurry vision, nausea Relieving: none Treatment: pt reports she took Robaxin this morning around 0730, pt last took Flexeril last night - pt reports neither of these medications helped, pt last took Tylenol last night Severity: 10/10  Pt denies VB, LOF, ctx, decreased FM, vaginal discharge/odor/itching. Pt denies vomiting, abdominal pain, constipation, diarrhea, or urinary problems. Pt denies fever, chills, fatigue, sweating or changes in appetite. Pt denies SOB or chest pain. Pt denies dizziness, HA, light-headedness, weakness.  Problems this pregnancy include: migraines, SVT, N/V. Allergies? Nitrous oxide, peach, shellfish, Reglan (IV form only), adhesive Current medications/supplements? Propranalol, Remus Lofflerambien (last took both of these last night),  magnesium, phenergan (last took last night QHS), Lexapro (daily), Pepcid (last took last night) Prenatal care provider? [facility], next appt 10/23/2019   OB History    Gravida  1   Para  0   Term  0   Preterm  0   AB  0   Living  0     SAB  0   TAB  0   Ectopic  0   Multiple  0   Live Births  0           Past Medical History:  Diagnosis Date  . Anemia   . Anxiety    takes lexapro  . Asthma    prn inhaler use  . HA (headache)   . Migraine   . Occipital neuralgia   . Sinus tachycardia   . SVT (supraventricular tachycardia) ([facility])     Past Surgical History:  Procedure Laterality Date  . shoulder Right   . TONSILLECTOMY AND ADENOIDECTOMY      Family History  Problem Relation Age of Onset  . Hypertension Mother   . Transient ischemic attack Mother   . Diabetes Brother        Type 1  . Breast cancer Maternal Aunt   . Cancer Maternal Aunt     Social History   Tobacco Use  . Smoking status: Never Smoker  . Smokeless tobacco: Never Used  Substance Use Topics  . Alcohol use: Not Currently    Alcohol/week: 1.0 standard drinks    Types: 1 Glasses of wine per week    Frequency: Never  . Drug use: Never    Allergies:  Allergies  Allergen Reactions  .  Nitrous Oxide Anaphylaxis  . Peach Flavor Anaphylaxis  . Shellfish Allergy Anaphylaxis  . Adhesive [Tape] Hives, Itching and Other (See Comments)    blisters  . Reglan [Metoclopramide]     Muscle spasms. Pt takes med at home    Medications Prior to Admission  Medication Sig Dispense Refill Last Dose  . escitalopram (LEXAPRO) 10 MG tablet Take 1 tablet (10 mg total) by mouth daily. 30 tablet 3 09/29/2019 at 1800  . famotidine (PEPCID) 20 MG tablet Take 1 tablet (20 mg total) by mouth 2 (two) times daily. 60 tablet 3 09/29/2019 at 1800  . ferrous sulfate 325 (65 FE) MG tablet Take 325 mg by mouth 3 (three) times daily.   09/29/2019 at 0800  . Magnesium 400 MG TABS Take 400 mg by mouth daily. 30  tablet 0 09/29/2019 at 1800  . metoCLOPramide (REGLAN) 10 MG tablet Take 1 tablet (10 mg total) by mouth 3 (three) times daily before meals. 30 tablet 2 09/29/2019 at 1200  . Prenatal Vit-Fe Fumarate-FA (PRENATAL MULTIVITAMIN) TABS tablet Take 1 tablet by mouth daily at 12 noon.   09/29/2019 at 1800  . promethazine (PHENERGAN) 25 MG suppository Place 1 suppository (25 mg total) rectally every 6 (six) hours as needed for nausea or vomiting. 60 each 5 09/29/2019 at 1900  . propranolol (INDERAL) 10 MG tablet TAKE 1 TABLET BY MOUTH DAILY AS NEEDED (Patient taking differently: Take 10 mg by mouth daily as needed (svt). ) 30 tablet 10 09/29/2019 at 1800  . cyclobenzaprine (FLEXERIL) 10 MG tablet Take 1 tablet (10 mg total) by mouth 3 (three) times daily as needed for muscle spasms. 30 tablet 0 09/27/2019  . Magnesium Gluconate 550 MG TABS TAKE 1 TABLET BY MOUTH 2 TIMES DAILY 60 tablet 30   . zolpidem (AMBIEN) 5 MG tablet Take 1 tablet (5 mg total) by mouth at bedtime as needed for sleep. 5 tablet 0 09/29/2019 at 1800    Review of Systems  Constitutional: Negative for chills, diaphoresis, fatigue and fever.  Eyes: Positive for photophobia and visual disturbance.  Respiratory: Negative for shortness of breath.   Cardiovascular: Negative for chest pain.  Gastrointestinal: Positive for nausea. Negative for abdominal pain, constipation, diarrhea and vomiting.  Genitourinary: Negative for dysuria, flank pain, frequency, pelvic pain, urgency, vaginal bleeding and vaginal discharge.  Neurological: Positive for headaches. Negative for dizziness, weakness and light-headedness.   Physical Exam   Blood pressure (!) 110/55, pulse 85, temperature 98.1 F (36.7 C), temperature source Oral, resp. rate 16, height 5\' 7"  (1.702 m), weight 73.9 kg, last menstrual period 06/01/2019, SpO2 100 %.  Patient Vitals for the past 24 hrs:  BP Temp Temp src Pulse Resp SpO2 Height Weight  09/30/19 1207 (!) 110/55 98.1 F (36.7  C) Oral 85 16 100 % 5\' 7"  (1.702 m) 73.9 kg   Physical Exam  Constitutional: She is oriented to person, place, and time. She appears well-developed and well-nourished. No distress.  HENT:  Head: Normocephalic and atraumatic.  Respiratory: Effort normal.  Neurological: She is alert and oriented to person, place, and time.  Skin: She is not diaphoretic.  Psychiatric: She has a normal mood and affect. Her behavior is normal. Judgment and thought content normal.   Results for orders placed or performed during the hospital encounter of 09/30/19 (from the past 24 hour(s))  Urinalysis, Routine w reflex microscopic     Status: Abnormal   Collection Time: 09/30/19 12:27 PM  Result Value Ref Range  Color, Urine YELLOW YELLOW   APPearance HAZY (A) CLEAR   Specific Gravity, Urine 1.008 1.005 - 1.030   pH 6.0 5.0 - 8.0   Glucose, UA NEGATIVE NEGATIVE mg/dL   Hgb urine dipstick NEGATIVE NEGATIVE   Bilirubin Urine NEGATIVE NEGATIVE   Ketones, ur NEGATIVE NEGATIVE mg/dL   Protein, ur NEGATIVE NEGATIVE mg/dL   Nitrite NEGATIVE NEGATIVE   Leukocytes,Ua NEGATIVE NEGATIVE   MAU Course  Procedures  MDM -10/10 migraine HA with blurry vision, nausea, photophobia -Compazine not able to be given with current medications -Reglan given PO as patient reports side effects with IV medication -HA cocktail ordered, IV team called to establish access -after HA cocktail, pt reports HA 8-9/10 -consulted with Dr. Adrian Blackwater regarding additional pain medications, per Dr. Adrian Blackwater can given Toradol 30mg  IV. -after Toradol, pt reports HA is now 4/10 and blurry vision, nausea and photophobia have resolved -pt discharged to home in stable condition  Orders Placed This Encounter  Procedures  . Urinalysis, Routine w reflex microscopic    Standing Status:   Standing    Number of Occurrences:   1  . Insert peripheral IV    Standing Status:   Standing    Number of Occurrences:   1  . Discharge patient    Order  Specific Question:   Discharge disposition    Answer:   01-Home or Self Care [1]    Order Specific Question:   Discharge patient date    Answer:   09/30/2019   Meds ordered this encounter  Medications  . lactated ringers bolus 1,000 mL  . dexamethasone (DECADRON) injection 10 mg  . diphenhydrAMINE (BENADRYL) injection 12.5 mg  . DISCONTD: metoCLOPramide (REGLAN) injection 10 mg  . metoCLOPramide (REGLAN) tablet 10 mg  . ketorolac (TORADOL) 30 MG/ML injection 30 mg   Assessment and Plan   1. Migraine with aura and without status migrainosus, not intractable   2. Supervision of normal first pregnancy, antepartum   3. [redacted] weeks gestation of pregnancy    -keep appt at [facility] with on 10/10/2019 -pt advised to keep journal of food ingested for 24hrs prior to start of HA and look for patterns and bring to HA clinic, patient also advised to maintain regular sleep schedule -discussed preventive vitamins and minerals and discussed appropriate use of medications for migraines in pregnancy, including starting medications at the earliest sign of a migraine -pt advised not to use -pt advised not to take Robaxin, or any other medications for her HA that were not prescribed for her this pregnancy or authorized by her OB -discussed appropriate way to treat migraine HA in pregnancy -return MAU precautions given -pt discharged to home in stable condition  14/02/2019 E  09/30/2019, 5:53 PM

## 2019-09-30 NOTE — Discharge Instructions (Signed)
For prevention of migraines in pregnancy: -Magnesium, 400mg  by mouth, once daily -Vitamin B2, 400mg  by mouth, once daily  For treatment of migraines in pregnancy: -take medication at the first sign of the pain of a headache, or the first sign of your aura -start with 1000mg  Tylenol (do not exceed 4000mg  of Tylenol in 24hrs), with or without Reglan 10mg  -if no relief after 1-2hours, can take Flexeril 10mg  **DO NOT USE REGLAN AND PHENERGAN AT THE SAME TIME**   Second Trimester of Pregnancy The second trimester is from week 14 through week 27 (months 4 through 6). The second trimester is often a time when you feel your best. Your body has adjusted to being pregnant, and you begin to feel better physically. Usually, morning sickness has lessened or quit completely, you may have more energy, and you may have an increase in appetite. The second trimester is also a time when the fetus is growing rapidly. At the end of the sixth month, the fetus is about 9 inches long and weighs about 1 pounds. You will likely begin to feel the baby move (quickening) between 16 and 20 weeks of pregnancy. Body changes during your second trimester Your body continues to go through many changes during your second trimester. The changes vary from woman to woman.  Your weight will continue to increase. You will notice your lower abdomen bulging out.  You may begin to get stretch marks on your hips, abdomen, and breasts.  You may develop headaches that can be relieved by medicines. The medicines should be approved by your health care provider.  You may urinate more often because the fetus is pressing on your bladder.  You may develop or continue to have heartburn as a result of your pregnancy.  You may develop constipation because certain hormones are causing the muscles that push waste through your intestines to slow down.  You may develop hemorrhoids or swollen, bulging veins (varicose veins).  You may have back  pain. This is caused by: ? Weight gain. ? Pregnancy hormones that are relaxing the joints in your pelvis. ? A shift in weight and the muscles that support your balance.  Your breasts will continue to grow and they will continue to become tender.  Your gums may bleed and may be sensitive to brushing and flossing.  Dark spots or blotches (chloasma, mask of pregnancy) may develop on your face. This will likely fade after the baby is born.  A dark line from your belly button to the pubic area (linea nigra) may appear. This will likely fade after the baby is born.  You may have changes in your hair. These can include thickening of your hair, rapid growth, and changes in texture. Some women also have hair loss during or after pregnancy, or hair that feels dry or thin. Your hair will most likely return to normal after your baby is born. What to expect at prenatal visits During a routine prenatal visit:  You will be weighed to make sure you and the fetus are growing normally.  Your blood pressure will be taken.  Your abdomen will be measured to track your baby's growth.  The fetal heartbeat will be listened to.  Any test results from the previous visit will be discussed. Your health care provider may ask you:  How you are feeling.  If you are feeling the baby move.  If you have had any abnormal symptoms, such as leaking fluid, bleeding, severe headaches, or abdominal cramping.  If you  are using any tobacco products, including cigarettes, chewing tobacco, and electronic cigarettes.  If you have any questions. Other tests that may be performed during your second trimester include:  Blood tests that check for: ? Low iron levels (anemia). ? High blood sugar that affects pregnant women (gestational diabetes) between 2824 and 28 weeks. ? Rh antibodies. This is to check for a protein on red blood cells (Rh factor).  Urine tests to check for infections, diabetes, or protein in the  urine.  An ultrasound to confirm the proper growth and development of the baby.  An amniocentesis to check for possible genetic problems.  Fetal screens for spina bifida and Down syndrome.  HIV (human immunodeficiency virus) testing. Routine prenatal testing includes screening for HIV, unless you choose not to have this test. Follow these instructions at home: Medicines  Follow your health care provider's instructions regarding medicine use. Specific medicines may be either safe or unsafe to take during pregnancy.  Take a prenatal vitamin that contains at least 600 micrograms (mcg) of folic acid.  If you develop constipation, try taking a stool softener if your health care provider approves. Eating and drinking   Eat a balanced diet that includes fresh fruits and vegetables, whole grains, good sources of protein such as meat, eggs, or tofu, and low-fat dairy. Your health care provider will help you determine the amount of weight gain that is right for you.  Avoid raw meat and uncooked cheese. These carry germs that can cause birth defects in the baby.  If you have low calcium intake from food, talk to your health care provider about whether you should take a daily calcium supplement.  Limit foods that are high in fat and processed sugars, such as fried and sweet foods.  To prevent constipation: ? Drink enough fluid to keep your urine clear or pale yellow. ? Eat foods that are high in fiber, such as fresh fruits and vegetables, whole grains, and beans. Activity  Exercise only as directed by your health care provider. Most women can continue their usual exercise routine during pregnancy. Try to exercise for 30 minutes at least 5 days a week. Stop exercising if you experience uterine contractions.  Avoid heavy lifting, wear low heel shoes, and practice good posture.  A sexual relationship may be continued unless your health care provider directs you otherwise. Relieving pain and  discomfort  Wear a good support bra to prevent discomfort from breast tenderness.  Take warm sitz baths to soothe any pain or discomfort caused by hemorrhoids. Use hemorrhoid cream if your health care provider approves.  Rest with your legs elevated if you have leg cramps or low back pain.  If you develop varicose veins, wear support hose. Elevate your feet for 15 minutes, 3-4 times a day. Limit salt in your diet. Prenatal Care  Write down your questions. Take them to your prenatal visits.  Keep all your prenatal visits as told by your health care provider. This is important. Safety  Wear your seat belt at all times when driving.  Make a list of emergency phone numbers, including numbers for family, friends, the hospital, and police and fire departments. General instructions  Ask your health care provider for a referral to a local prenatal education class. Begin classes no later than the beginning of month 6 of your pregnancy.  Ask for help if you have counseling or nutritional needs during pregnancy. Your health care provider can offer advice or refer you to specialists for help  with various needs.  Do not use hot tubs, steam rooms, or saunas.  Do not douche or use tampons or scented sanitary pads.  Do not cross your legs for long periods of time.  Avoid cat litter boxes and soil used by cats. These carry germs that can cause birth defects in the baby and possibly loss of the fetus by miscarriage or stillbirth.  Avoid all smoking, herbs, alcohol, and unprescribed drugs. Chemicals in these products can affect the formation and growth of the baby.  Do not use any products that contain nicotine or tobacco, such as cigarettes and e-cigarettes. If you need help quitting, ask your health care provider.  Visit your dentist if you have not gone yet during your pregnancy. Use a soft toothbrush to brush your teeth and be gentle when you floss. Contact a health care provider if:  You  have dizziness.  You have mild pelvic cramps, pelvic pressure, or nagging pain in the abdominal area.  You have persistent nausea, vomiting, or diarrhea.  You have a bad smelling vaginal discharge.  You have pain when you urinate. Get help right away if:  You have a fever.  You are leaking fluid from your vagina.  You have spotting or bleeding from your vagina.  You have severe abdominal cramping or pain.  You have rapid weight gain or weight loss.  You have shortness of breath with chest pain.  You notice sudden or extreme swelling of your face, hands, ankles, feet, or legs.  You have not felt your baby move in over an hour.  You have severe headaches that do not go away when you take medicine.  You have vision changes. Summary  The second trimester is from week 14 through week 27 (months 4 through 6). It is also a time when the fetus is growing rapidly.  Your body goes through many changes during pregnancy. The changes vary from woman to woman.  Avoid all smoking, herbs, alcohol, and unprescribed drugs. These chemicals affect the formation and growth your baby.  Do not use any tobacco products, such as cigarettes, chewing tobacco, and e-cigarettes. If you need help quitting, ask your health care provider.  Contact your health care provider if you have any questions. Keep all prenatal visits as told by your health care provider. This is important. This information is not intended to replace advice given to you by your health care provider. Make sure you discuss any questions you have with your health care provider. Document Released: 10/17/2001 Document Revised: 02/14/2019 Document Reviewed: 11/28/2016 Elsevier Patient Education  2020 [employer].

## 2019-10-06 ENCOUNTER — Other Ambulatory Visit (HOSPITAL_COMMUNITY): Payer: Self-pay | Admitting: *Deleted

## 2019-10-06 ENCOUNTER — Encounter (HOSPITAL_BASED_OUTPATIENT_CLINIC_OR_DEPARTMENT_OTHER): Payer: Self-pay | Admitting: *Deleted

## 2019-10-06 ENCOUNTER — Emergency Department (HOSPITAL_BASED_OUTPATIENT_CLINIC_OR_DEPARTMENT_OTHER)
Admission: EM | Admit: 2019-10-06 | Discharge: 2019-10-06 | Disposition: A | Discharge status: Home / Self Care | Payer: 59 | Attending: Emergency Medicine | Admitting: Emergency Medicine

## 2019-10-06 ENCOUNTER — Other Ambulatory Visit: Payer: Self-pay

## 2019-10-06 DIAGNOSIS — O99512 Diseases of the respiratory system complicating pregnancy, second trimester: Secondary | ICD-10-CM | POA: Diagnosis not present

## 2019-10-06 DIAGNOSIS — Z3A18 18 weeks gestation of pregnancy: Secondary | ICD-10-CM | POA: Diagnosis not present

## 2019-10-06 DIAGNOSIS — G43009 Migraine without aura, not intractable, without status migrainosus: Secondary | ICD-10-CM | POA: Insufficient documentation

## 2019-10-06 DIAGNOSIS — O99352 Diseases of the nervous system complicating pregnancy, second trimester: Secondary | ICD-10-CM | POA: Diagnosis present

## 2019-10-06 DIAGNOSIS — J45909 Unspecified asthma, uncomplicated: Secondary | ICD-10-CM | POA: Diagnosis not present

## 2019-10-06 DIAGNOSIS — R112 Nausea with vomiting, unspecified: Secondary | ICD-10-CM | POA: Insufficient documentation

## 2019-10-06 LAB — COMPREHENSIVE METABOLIC PANEL
ALT: 13 U/L (ref 0–44)
AST: 14 U/L — ABNORMAL LOW (ref 15–41)
Albumin: 3.1 g/dL — ABNORMAL LOW (ref 3.5–5.0)
Alkaline Phosphatase: 43 U/L (ref 38–126)
Anion gap: 7 (ref 5–15)
BUN: 8 mg/dL (ref 6–20)
CO2: 20 mmol/L — ABNORMAL LOW (ref 22–32)
Calcium: 8.7 mg/dL — ABNORMAL LOW (ref 8.9–10.3)
Chloride: 108 mmol/L (ref 98–111)
Creatinine, Ser: 0.47 mg/dL (ref 0.44–1.00)
GFR calc Af Amer: >60 mL/min (ref >60)
GFR calc non Af Amer: >60 mL/min (ref >60)
Glucose, Bld: 87 mg/dL (ref 70–99)
Potassium: 3.7 mmol/L (ref 3.5–5.1)
Sodium: 135 mmol/L (ref 135–145)
Total Bilirubin: 0.4 mg/dL (ref 0.3–1.2)
Total Protein: 6.3 g/dL — ABNORMAL LOW (ref 6.5–8.1)

## 2019-10-06 LAB — CBC WITH DIFFERENTIAL/PLATELET
Abs Immature Granulocytes: 0.12 10*3/uL — ABNORMAL HIGH (ref 0.00–0.07)
Basophils Absolute: 0 10*3/uL (ref 0.0–0.1)
Basophils Relative: 0 %
Eosinophils Absolute: 0.5 10*3/uL (ref 0.0–0.5)
Eosinophils Relative: 4 %
HCT: 33.5 % — ABNORMAL LOW (ref 36.0–46.0)
Hemoglobin: 11.1 g/dL — ABNORMAL LOW (ref 12.0–15.0)
Immature Granulocytes: 1 %
Lymphocytes Relative: 19 %
Lymphs Abs: 2.4 10*3/uL (ref 0.7–4.0)
MCH: 30.8 pg (ref 26.0–34.0)
MCHC: 33.1 g/dL (ref 30.0–36.0)
MCV: 93.1 fL (ref 80.0–100.0)
Monocytes Absolute: 0.8 10*3/uL (ref 0.1–1.0)
Monocytes Relative: 7 %
Neutro Abs: 8.4 10*3/uL — ABNORMAL HIGH (ref 1.7–7.7)
Neutrophils Relative %: 69 %
Platelets: 262 10*3/uL (ref 150–400)
RBC: 3.6 MIL/uL — ABNORMAL LOW (ref 3.87–5.11)
RDW: 13.2 % (ref 11.5–15.5)
WBC: 12.1 10*3/uL — ABNORMAL HIGH (ref 4.0–10.5)
nRBC: 0 % (ref 0.0–0.2)

## 2019-10-06 LAB — URINALYSIS, ROUTINE W REFLEX MICROSCOPIC
Bilirubin Urine: NEGATIVE
Glucose, UA: NEGATIVE mg/dL
Hgb urine dipstick: NEGATIVE
Ketones, ur: NEGATIVE mg/dL
Leukocytes,Ua: NEGATIVE
Nitrite: NEGATIVE
Protein, ur: NEGATIVE mg/dL
Specific Gravity, Urine: 1.015 (ref 1.005–1.030)
pH: 7 (ref 5.0–8.0)

## 2019-10-06 LAB — LIPASE, BLOOD: Lipase: 36 U/L (ref 11–51)

## 2019-10-06 MED ORDER — PROMETHAZINE HCL 25 MG/ML IJ SOLN
INTRAMUSCULAR | Status: AC
  Filled 2019-10-06: qty 1

## 2019-10-06 MED ORDER — DIPHENHYDRAMINE HCL 50 MG/ML IJ SOLN
12.5000 mg | Freq: Once | INTRAMUSCULAR | Status: AC
  Administered 2019-10-06: 12.5 mg via INTRAVENOUS
  Filled 2019-10-06: qty 1

## 2019-10-06 MED ORDER — GENERIC EXTERNAL MEDICATION
Status: DC
Start: ? — End: 2019-10-06

## 2019-10-06 MED ORDER — SODIUM CHLORIDE 0.9 % IV BOLUS
1000.0000 mL | Freq: Once | INTRAVENOUS | Status: AC
  Administered 2019-10-06: 1000 mL via INTRAVENOUS

## 2019-10-06 MED ORDER — DEXAMETHASONE SODIUM PHOSPHATE 10 MG/ML IJ SOLN
10.0000 mg | Freq: Once | INTRAMUSCULAR | Status: AC
  Administered 2019-10-06: 10 mg via INTRAVENOUS
  Filled 2019-10-06: qty 1

## 2019-10-06 MED ORDER — FENTANYL CITRATE (PF) 100 MCG/2ML IJ SOLN
100.0000 ug | Freq: Once | INTRAMUSCULAR | Status: AC
  Administered 2019-10-06: 20:00:00 100 ug via INTRAVENOUS
  Filled 2019-10-06: qty 2

## 2019-10-06 MED ORDER — PROMETHAZINE HCL 25 MG/ML IJ SOLN
25.0000 mg | Freq: Once | INTRAMUSCULAR | Status: AC
  Administered 2019-10-06: 20:00:00 25 mg via INTRAVENOUS

## 2019-10-06 MED ORDER — PROMETHAZINE HCL 25 MG/ML IJ SOLN
12.5000 mg | Freq: Once | INTRAMUSCULAR | Status: AC
  Administered 2019-10-06: 12.5 mg via INTRAVENOUS

## 2019-10-06 MED ORDER — FENTANYL CITRATE (PF) 100 MCG/2ML IJ SOLN
100.0000 ug | Freq: Once | INTRAMUSCULAR | Status: AC
  Administered 2019-10-06: 100 ug via INTRAVENOUS
  Filled 2019-10-06: qty 2

## 2019-10-06 NOTE — ED Triage Notes (Signed)
Migraine headache since this am.

## 2019-10-06 NOTE — ED Notes (Signed)
Pt placed on oxygen via [place] at 4 L.

## 2019-10-06 NOTE — ED Provider Notes (Signed)
Emergency Department Provider Note   I have reviewed the triage vital signs and the nursing notes.   HISTORY  Chief Complaint Migraine   HPI Michelle Vincent is a 28 y.o. female with past history reviewed below presents to the emergency department with acute worsening migraine type headache.  Patient has left-sided throbbing discomfort typical of her migraine headaches.  She has history of similar headaches in the past and medications were discontinued once she became pregnant.  She is currently 18 weeks and [facility]  She has had associated nausea and vomiting symptoms with the headache but these have been ongoing through most of pregnancy.  She has been to the emergency department and [facility] several times with breakthrough headaches.  She states in the past her pain is improved with fentanyl, Decadron, Phenergan.  She has been referred to an outpatient neurologist specializing in migraine management in pregnancy and sees them on Friday (2 days) and has appointment scheduled 3 times per week with her OB to come in for IV fluids and Phenergan.  Her next appointment for that is tomorrow.  Patient would like some help with resolving her headache symptoms tonight so she can make those appointments.  She denies any fevers, chills, numbness, weakness.   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   Mild intermittent asthma without complication 12/27/2017   Chronic migraine without aura without status migrainosus, not  intractable 05/24/2017   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    Past Surgical History:  Procedure Laterality Date   shoulder Right    TONSILLECTOMY AND ADENOIDECTOMY      Allergies Nitrous oxide, Peach flavor, Shellfish allergy, Adhesive [tape], and Reglan [metoclopramide]  Family History  Problem Relation Age of Onset   Hypertension Mother    Transient ischemic attack Mother    Diabetes Brother        Type 1   Breast cancer Maternal Aunt    Cancer Maternal Aunt     Social History Social History   Tobacco Use   Smoking status: Never Smoker   Smokeless tobacco: Never Used  Substance Use Topics   Alcohol use: Not Currently    Alcohol/week: 1.0 standard drinks    Types: 1 Glasses of wine per week    Frequency: Never   Drug use: Never    Review of Systems  Constitutional: No fever/chills Eyes: No visual changes. ENT: No sore throat. Cardiovascular: Denies chest pain. Respiratory: Denies shortness of breath. Gastrointestinal: No abdominal pain. Positive nausea and vomiting.  No diarrhea.  No constipation. Genitourinary: Negative for dysuria. Musculoskeletal: Negative for back pain. Skin: Negative for rash. Neurological: Negative for focal weakness or numbness. Positive HA.   10-point ROS otherwise negative.  ____________________________________________   PHYSICAL EXAM:  VITAL SIGNS: ED Triage Vitals  Enc Vitals Group     BP 10/06/19 1748 106/78     Pulse Rate 10/06/19 1748 89     Resp 10/06/19 1748 14     Temp  10/06/19 1748 97.8 F (36.6 C)     Temp Source 10/06/19 1748 Oral     SpO2 10/06/19 1748 100 %     Weight 10/06/19 1749 163 lb (73.9 kg)     Height 10/06/19 1749 5\' 7"  (1.702 m)   Constitutional: Alert and oriented. Well appearing and in no acute distress. Eyes: Conjunctivae are normal.  Head: Atraumatic. Nose: No congestion/rhinnorhea. Mouth/Throat: Mucous membranes are moist.   Neck: No stridor.   Cardiovascular: Normal rate,  regular rhythm. Good peripheral circulation. Grossly normal heart sounds.   Respiratory: Normal respiratory effort.  No retractions. Lungs CTAB. Gastrointestinal: Soft and nontender. No distention.  Musculoskeletal: No lower extremity tenderness nor edema. No gross deformities of extremities. Neurologic:  Normal speech and language. No gross focal neurologic deficits are appreciated.  Skin:  Skin is warm, dry and intact. No rash noted.  ____________________________________________   LABS (all labs ordered are listed, but only abnormal results are displayed)  Labs Reviewed  COMPREHENSIVE METABOLIC PANEL - Abnormal; Notable for the following components:      Result Value   CO2 20 (*)    Calcium 8.7 (*)    Total Protein 6.3 (*)    Albumin 3.1 (*)    AST 14 (*)    All other components within normal limits  CBC WITH DIFFERENTIAL/PLATELET - Abnormal; Notable for the following components:   WBC 12.1 (*)    RBC 3.60 (*)    Hemoglobin 11.1 (*)    HCT 33.5 (*)    Neutro Abs 8.4 (*)    Abs Immature Granulocytes 0.12 (*)    All other components within normal limits  LIPASE, BLOOD  URINALYSIS, ROUTINE W REFLEX MICROSCOPIC   ____________________________________________   PROCEDURES  Procedure(s) performed:   Procedures  None  ____________________________________________   INITIAL IMPRESSION / ASSESSMENT AND PLAN / ED COURSE  Pertinent labs & imaging results that were available during my care of the patient were reviewed by me and considered in my medical decision making (see chart for details).   Patient presents to the emergency department with headache symptoms typical of her migraine headaches.  She has had multiple breakthrough headaches in pregnancy along with nausea and vomiting.  Plan primarily for symptom control at this point.  My suspicion for emergency cause of headache such as hemorrhage or venous thrombosis is very low.  Her headache is very typical of her migraines  prior to her pregnancy.  She has appropriate outpatient follow-up at this point.  Plan for symptom management and reassess.   10:30 PM  Patient feeling much better after additional pain and nausea medication.  Her lab work is reviewed with no acute findings.  She has follow-up appointment tomorrow with GYN and neurology later this week.  Typically would not treat with the above headache cocktail blood pregnancy limits options.  She has a ride home.  Discussed ED return precautions.  She reports having Phenergan tablets as well as suppositories at home.  ____________________________________________  FINAL CLINICAL IMPRESSION(S) / ED DIAGNOSES  Final diagnoses:  Migraine without aura and without status migrainosus, not intractable  Non-intractable vomiting with nausea, unspecified vomiting type     MEDICATIONS GIVEN DURING THIS VISIT:  Medications  sodium chloride 0.9 % bolus 1,000 mL ( Intravenous Stopped 10/06/19 2054)  promethazine (PHENERGAN) injection 25 mg (25 mg Intravenous Given 10/06/19 1952)  fentaNYL (SUBLIMAZE) injection 100 mcg (100 mcg Intravenous Given 10/06/19 1954)  dexamethasone (DECADRON) injection 10 mg (10 mg Intravenous Given 10/06/19 1956)  promethazine (PHENERGAN) injection 12.5 mg (12.5 mg Intravenous Given 10/06/19 2055)  fentaNYL (SUBLIMAZE) injection 100 mcg (100 mcg Intravenous Given 10/06/19 2055)  diphenhydrAMINE (BENADRYL) injection 12.5 mg (12.5 mg Intravenous Given 10/06/19 2141)    Note:  This document was prepared using Dragon voice recognition software and may include unintentional dictation errors.  Nanda Quinton, MD, [facility]    Krystal Delduca, [facility], MD 10/06/19 2231

## 2019-10-06 NOTE — Discharge Instructions (Signed)
You have been seen in the Emergency Department (ED) for a headache.  Please use Tylenol as needed for symptoms, but only as written on the box.  As we have discussed, please follow up with your primary care doctor as soon as possible regarding today's Emergency Department (ED) visit and your headache symptoms.    Call your doctor or return to the ED if you have a worsening headache, sudden and severe headache, confusion, slurred speech, facial droop, weakness or numbness in any arm or leg, extreme fatigue, vision problems, or other symptoms that concern you.  

## 2019-10-07 ENCOUNTER — Ambulatory Visit (HOSPITAL_COMMUNITY)
Admission: RE | Admit: 2019-10-07 | Discharge: 2019-10-07 | Disposition: A | Discharge status: Home / Self Care | Payer: 59 | Source: Ambulatory Visit | Attending: Family Medicine | Admitting: Family Medicine

## 2019-10-07 DIAGNOSIS — O210 Mild hyperemesis gravidarum: Secondary | ICD-10-CM | POA: Diagnosis present

## 2019-10-07 DIAGNOSIS — Z3A00 Weeks of gestation of pregnancy not specified: Secondary | ICD-10-CM | POA: Diagnosis not present

## 2019-10-07 MED ORDER — PROMETHAZINE HCL 25 MG/ML IJ SOLN
25.0000 mg | INTRAVENOUS | Status: DC
  Administered 2019-10-07: 25 mg via INTRAVENOUS
  Filled 2019-10-07: qty 1

## 2019-10-07 NOTE — Progress Notes (Signed)
Flushed PIV and sent patient home with it with instructions regarding care.  Pt verbalized understanding

## 2019-10-08 ENCOUNTER — Other Ambulatory Visit: Payer: Self-pay

## 2019-10-08 ENCOUNTER — Ambulatory Visit (HOSPITAL_COMMUNITY)
Admission: RE | Admit: 2019-10-08 | Discharge: 2019-10-08 | Disposition: A | Discharge status: Home / Self Care | Payer: 59 | Source: Ambulatory Visit | Attending: Family Medicine | Admitting: Family Medicine

## 2019-10-08 DIAGNOSIS — Z3A00 Weeks of gestation of pregnancy not specified: Secondary | ICD-10-CM | POA: Insufficient documentation

## 2019-10-08 DIAGNOSIS — O210 Mild hyperemesis gravidarum: Secondary | ICD-10-CM | POA: Diagnosis present

## 2019-10-08 MED ORDER — PROMETHAZINE HCL 25 MG/ML IJ SOLN
25.0000 mg | INTRAVENOUS | Status: DC
  Administered 2019-10-08: 25 mg via INTRAVENOUS
  Filled 2019-10-08: qty 1

## 2019-10-09 ENCOUNTER — Ambulatory Visit (HOSPITAL_COMMUNITY)
Admission: RE | Admit: 2019-10-09 | Discharge: 2019-10-09 | Disposition: A | Discharge status: Home / Self Care | Payer: 59 | Source: Ambulatory Visit | Attending: Family Medicine | Admitting: Family Medicine

## 2019-10-09 ENCOUNTER — Other Ambulatory Visit: Payer: Self-pay

## 2019-10-09 DIAGNOSIS — O210 Mild hyperemesis gravidarum: Secondary | ICD-10-CM | POA: Insufficient documentation

## 2019-10-09 DIAGNOSIS — Z3A00 Weeks of gestation of pregnancy not specified: Secondary | ICD-10-CM | POA: Diagnosis not present

## 2019-10-09 MED ORDER — PROMETHAZINE HCL 25 MG/ML IJ SOLN
25.0000 mg | INTRAVENOUS | Status: DC
  Filled 2019-10-09: qty 1

## 2019-10-09 MED ORDER — PROMETHAZINE HCL 25 MG/ML IJ SOLN
25.0000 mg | INTRAVENOUS | Status: DC
  Administered 2019-10-09: 25 mg via INTRAVENOUS
  Filled 2019-10-09: qty 1

## 2019-10-10 ENCOUNTER — Encounter: Payer: Self-pay | Admitting: Physician Assistant

## 2019-10-10 ENCOUNTER — Emergency Department (HOSPITAL_BASED_OUTPATIENT_CLINIC_OR_DEPARTMENT_OTHER)
Admission: EM | Admit: 2019-10-10 | Discharge: 2019-10-10 | Disposition: A | Discharge status: Home / Self Care | Payer: 59 | Attending: Emergency Medicine | Admitting: Emergency Medicine

## 2019-10-10 ENCOUNTER — Ambulatory Visit: Payer: 59 | Admitting: Physician Assistant

## 2019-10-10 ENCOUNTER — Encounter (HOSPITAL_BASED_OUTPATIENT_CLINIC_OR_DEPARTMENT_OTHER): Payer: Self-pay

## 2019-10-10 ENCOUNTER — Other Ambulatory Visit: Payer: Self-pay

## 2019-10-10 DIAGNOSIS — G43909 Migraine, unspecified, not intractable, without status migrainosus: Secondary | ICD-10-CM | POA: Diagnosis not present

## 2019-10-10 DIAGNOSIS — R519 Headache, unspecified: Secondary | ICD-10-CM

## 2019-10-10 DIAGNOSIS — O211 Hyperemesis gravidarum with metabolic disturbance: Secondary | ICD-10-CM | POA: Insufficient documentation

## 2019-10-10 DIAGNOSIS — O26892 Other specified pregnancy related conditions, second trimester: Secondary | ICD-10-CM | POA: Insufficient documentation

## 2019-10-10 DIAGNOSIS — Z3A18 18 weeks gestation of pregnancy: Secondary | ICD-10-CM | POA: Diagnosis not present

## 2019-10-10 DIAGNOSIS — Z79899 Other long term (current) drug therapy: Secondary | ICD-10-CM | POA: Diagnosis not present

## 2019-10-10 DIAGNOSIS — G43709 Chronic migraine without aura, not intractable, without status migrainosus: Secondary | ICD-10-CM

## 2019-10-10 DIAGNOSIS — IMO0002 Reserved for concepts with insufficient information to code with codable children: Secondary | ICD-10-CM

## 2019-10-10 DIAGNOSIS — R002 Palpitations: Secondary | ICD-10-CM | POA: Insufficient documentation

## 2019-10-10 DIAGNOSIS — Z3492 Encounter for supervision of normal pregnancy, unspecified, second trimester: Secondary | ICD-10-CM

## 2019-10-10 DIAGNOSIS — O26899 Other specified pregnancy related conditions, unspecified trimester: Secondary | ICD-10-CM

## 2019-10-10 MED ORDER — CYCLOBENZAPRINE HCL 10 MG PO TABS: 10.0000 mg | ORAL_TABLET | Freq: Three times a day (TID) | ORAL | 3 refills | Status: DC | PRN

## 2019-10-10 MED ORDER — DEXAMETHASONE SODIUM PHOSPHATE 10 MG/ML IJ SOLN
10.0000 mg | Freq: Once | INTRAMUSCULAR | Status: AC
  Administered 2019-10-10: 10 mg via INTRAVENOUS
  Filled 2019-10-10: qty 1

## 2019-10-10 MED ORDER — PROMETHAZINE HCL 25 MG/ML IJ SOLN
12.5000 mg | Freq: Once | INTRAMUSCULAR | Status: AC
  Administered 2019-10-10: 12.5 mg via INTRAVENOUS
  Filled 2019-10-10: qty 1

## 2019-10-10 MED ORDER — SODIUM CHLORIDE 0.9 % IV BOLUS
1000.0000 mL | Freq: Once | INTRAVENOUS | Status: AC
  Administered 2019-10-10: 13:00:00 1000 mL via INTRAVENOUS

## 2019-10-10 MED ORDER — PROMETHAZINE HCL 25 MG/ML IJ SOLN
INTRAMUSCULAR | Status: AC
  Filled 2019-10-10: qty 1

## 2019-10-10 MED ORDER — FENTANYL CITRATE (PF) 100 MCG/2ML IJ SOLN
100.0000 ug | Freq: Once | INTRAMUSCULAR | Status: AC
  Administered 2019-10-10: 100 ug via INTRAVENOUS
  Filled 2019-10-10: qty 2

## 2019-10-10 MED ORDER — DIPHENHYDRAMINE HCL 50 MG/ML IJ SOLN
12.5000 mg | Freq: Once | INTRAMUSCULAR | Status: AC
  Administered 2019-10-10: 12.5 mg via INTRAVENOUS
  Filled 2019-10-10: qty 1

## 2019-10-10 MED ORDER — BUTALBITAL-APAP-CAFFEINE 50-325-40 MG PO CAPS: 1.0000 | ORAL_CAPSULE | Freq: Four times a day (QID) | ORAL | 3 refills | Status: DC | PRN

## 2019-10-10 MED ORDER — PROMETHAZINE HCL 25 MG/ML IJ SOLN
12.5000 mg | Freq: Once | INTRAMUSCULAR | Status: AC
  Administered 2019-10-10: 12.5 mg via INTRAVENOUS

## 2019-10-10 NOTE — ED Provider Notes (Signed)
[facility] EMERGENCY DEPARTMENT Provider Note   CSN: [id number] Arrival date & time: 10/10/19  1153     History   Chief Complaint Chief Complaint  Patient presents with  . Tachycardia    18weeks preg    HPI Michelle Vincent is a 28 y.o. female.  She is [redacted] weeks pregnant and is complaining of intermittent rapid heart rate and migraine headache.  She has had multiple visits for same.  She just saw a headache specialist today and was given prescriptions for medications that she has not picked up yet.  She has noticed her heart rate being 120 130 since yesterday that did not respond to taking her propranolol.  She said they usually give her fentanyl Phenergan and Decadron for her headaches which gives her some relief.  No problems with the pregnancy.  She said she has had a lot of nausea and vomiting with her pregnancy and has an IV in place that her doctors used to give her fluids and medications.     The history is provided by the patient.  Migraine This is a recurrent problem. The problem occurs daily. The problem has not changed since onset.Associated symptoms include headaches. Pertinent negatives include no chest pain, no abdominal pain and no shortness of breath. Nothing aggravates the symptoms. Nothing relieves the symptoms. She has tried acetaminophen for the symptoms. The treatment provided no relief.    Past Medical History:  Diagnosis Date  . Anemia   . Anxiety    takes lexapro  . Asthma    prn inhaler use  . HA (headache)   . Migraine   . Occipital neuralgia   . Sinus tachycardia   . SVT (supraventricular tachycardia) [facility])     Patient Active Problem List   Diagnosis Date Noted  . Hyperemesis gravidarum 08/29/2019  . Supervision of normal first pregnancy, antepartum 08/12/2019  . Allergic rhinitis 08/12/2019  . Tachycardia 03/03/2019  . 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 Mild intermittent asthma without complication 12/27/2017  .  Chronic migraine without aura without status migrainosus, not intractable 05/24/2017  . 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 shoulder Right   . TONSILLECTOMY AND ADENOIDECTOMY       OB History    Gravida  1   Para  0   Term  0   Preterm  0   AB  0   Living  0     SAB  0   TAB  0   Ectopic  0   Multiple  0   Live Births  0            Home Medications    Prior to Admission medications   Medication Sig Start Date End Date Taking? Authorizing Provider  Butalbital-APAP-Caffeine 50-325-40 MG capsule Take 1-2 capsules by mouth every 6 (six) hours as needed for headache. 10/10/19   Glyn Ade, Scot Jun, PA-C  cyclobenzaprine (FLEXERIL) 10 MG tablet Take 1 tablet (10 mg total) by mouth 3 (three) times daily as needed for muscle spasms. 10/10/19   Glyn Ade, Scot Jun, PA-C  escitalopram (LEXAPRO) 10 MG tablet Take 1 tablet (10 mg total) by mouth daily. 09/09/19   Levie Heritage, DO  famotidine (PEPCID) 20 MG tablet Take 1 tablet (20 mg total) by mouth 2 (two) times daily. 09/09/19   Levie Heritage, DO  ferrous sulfate 325 (65 FE) MG tablet Take 325 mg by mouth  3 (three) times daily. 12/04/14   [provider]  Magnesium 400 MG TABS Take 400 mg by mouth daily. 09/29/19   Isla Pence, MD  metoCLOPramide (REGLAN) 10 MG tablet Take 1 tablet (10 mg total) by mouth 3 (three) times daily before meals. 09/15/19   Rasch, Artist Pais, NP  Prenatal Vit-Fe Fumarate-FA (PRENATAL MULTIVITAMIN) TABS tablet Take 1 tablet by mouth daily at 12 noon.    [provider]  promethazine (PHENERGAN) 25 MG suppository Place 1 suppository (25 mg total) rectally every 6 (six) hours as needed for nausea or vomiting. 09/23/19   Truett Mainland, DO  propranolol (INDERAL) 10 MG tablet TAKE 1 TABLET BY MOUTH DAILY AS NEEDED Patient not taking: No sig reported 04/15/19   Minus Breeding, MD  zolpidem (AMBIEN) 5 MG tablet Take 1  tablet (5 mg total) by mouth at bedtime as needed for sleep. 09/28/19   Davonna Belling, MD    Family History Family History  Problem Relation Age of Onset  . Hypertension Mother   . Transient ischemic attack Mother   . Diabetes Brother        Type 1  . Breast cancer Maternal Aunt   . Cancer Maternal Aunt     Social History Social History   Tobacco Use  . Smoking status: Never Smoker  . Smokeless tobacco: Never Used  Substance Use Topics  . Alcohol use: Not Currently    Frequency: Never  . Drug use: Never     Allergies   Nitrous oxide, Peach flavor, Shellfish allergy, and Adhesive [tape]   Review of Systems Review of Systems  Constitutional: Negative for fever.  HENT: Negative for sore throat.   Eyes: Negative for pain.  Respiratory: Negative for shortness of breath.   Cardiovascular: Negative for chest pain.  Gastrointestinal: Positive for nausea and vomiting. Negative for abdominal pain.  Genitourinary: Negative for dysuria.  Musculoskeletal: Negative for neck pain.  Skin: Negative for rash.  Neurological: Positive for headaches.     Physical Exam Updated Vital Signs BP 124/75 (BP Location: Right Arm)   Pulse 89   Temp 98.2 F (36.8 C) (Oral)   Resp 20   LMP 06/01/2019   SpO2 99%   Physical Exam Vitals signs and nursing note reviewed.  Constitutional:      General: She is not in acute distress.    Appearance: She is well-developed.  HENT:     Head: Normocephalic and atraumatic.  Eyes:     Conjunctiva/sclera: Conjunctivae normal.  Neck:     Musculoskeletal: Neck supple.  Cardiovascular:     Rate and Rhythm: Normal rate and regular rhythm.     Heart sounds: No murmur.  Pulmonary:     Effort: Pulmonary effort is normal. No respiratory distress.     Breath sounds: Normal breath sounds.  Abdominal:     Palpations: Abdomen is soft.     Tenderness: There is no abdominal tenderness.  Musculoskeletal: Normal range of motion.     Right lower leg:  No edema.     Left lower leg: No edema.  Skin:    General: Skin is warm and dry.  Neurological:     General: No focal deficit present.     Mental Status: She is alert and oriented to person, place, and time.     Sensory: No sensory deficit.     Motor: No weakness.     Gait: Gait normal.      ED Treatments / Results  Labs (  all labs ordered are listed, but only abnormal results are displayed) Labs Reviewed - No data to display  EKG EKG Interpretation  Date/Time:  Friday October 10 2019 12:04:30 EST Ventricular Rate:  88 PR Interval:    QRS Duration: 74 QT Interval:  356 QTC Calculation: 431 R Axis:   69 Text Interpretation: Sinus rhythm Borderline T abnormalities, anterior leads Baseline wander in lead(s) II aVR V2 V4 No significant change since 3/20 Reconfirmed by Meridee ScoreButler, Floris Neuhaus [phone]([id number]) on 10/10/2019 12:10:05 PM   Radiology No results found.  Procedures Procedures (including critical care time)  Medications Ordered in ED Medications  dexamethasone (DECADRON) injection 10 mg (10 mg Intravenous Given 10/10/19 1312)  promethazine (PHENERGAN) injection 12.5 mg (12.5 mg Intravenous Given 10/10/19 1304)  fentaNYL (SUBLIMAZE) injection 100 mcg (100 mcg Intravenous Given 10/10/19 1309)  sodium chloride 0.9 % bolus 1,000 mL ( Intravenous Stopped 10/10/19 1406)  diphenhydrAMINE (BENADRYL) injection 12.5 mg (12.5 mg Intravenous Given 10/10/19 1302)  diphenhydrAMINE (BENADRYL) injection 12.5 mg (12.5 mg Intravenous Given 10/10/19 1443)  fentaNYL (SUBLIMAZE) injection 100 mcg (100 mcg Intravenous Given 10/10/19 1452)  promethazine (PHENERGAN) injection 12.5 mg (12.5 mg Intravenous Given 10/10/19 1446)     Initial Impression / Assessment and Plan / ED Course  I have reviewed the triage vital signs and the nursing notes.  Pertinent labs & imaging results that were available during my care of the patient were reviewed by me and considered in my medical decision making (see chart for  details).  Clinical Course as of Oct 09 1813  Fri Oct 10, 2019  [id number] 28 year old female history of migraines here with complaint of migraine headache and intermittent tachycardia.  Benign exam not tachycardic here.  Afebrile.  Nonfocal neuro exam.  Likely migraine headache in the setting of second trimester pregnancy.  We will repeat what she was given last time around as she says they gave her the most relief.   [MB]  1413 Patient states her symptoms are mildly improved.  She is asking for another round of medicines.   [MB]    Clinical Course User Index [MB] Terrilee FilesButler, Akeen Ledyard C, MD        Final Clinical Impressions(s) / ED Diagnoses   Final diagnoses:  Palpitations  Migraine without status migrainosus, not intractable, unspecified migraine type  Second trimester pregnancy    ED Discharge Orders    None       Terrilee FilesButler, Johney Perotti C, MD 10/10/19 1816

## 2019-10-10 NOTE — ED Triage Notes (Addendum)
Pt c/o intermittent tachycardia x 2 days-"120-130"-hx of same and takes propanolol (last dose 8am) -also c/o migraine x 2 days-pt seen PTA at "headache clinic"-pt is [redacted] weeks pregnant-pt denies fever/flu like sx-NAD-steady gait

## 2019-10-10 NOTE — Discharge Instructions (Signed)
You were seen in the emergency department for worsening migraine headache and elevated heart rate.  You were given medications which helped improve your symptoms.  Please use the medications prescribed by your headache specialist that you saw today.  Return to the emergency department if any acute worsening of your symptoms.

## 2019-10-10 NOTE — Progress Notes (Signed)
Patient is [redacted] weeks pregnant  History:  Michelle Vincent is a 28 y.o. G1P0000 who presents to clinic today for new eval of headache.  She notes migraines run in her family.  In 2013 her headaches worsened significantly.  She would notice her vision blackout 5-10 minutes before the headache. It can start on one side of temporal area/parietal and then spread.  It also goes to the back.  Lights bother her.  Movement makes it worse, Nausea and vomiting with the headache.  It lasts several days.  It is severe.   She uses propanolol for SVT which is a new problem for her.   She has tried tylenol, flexeril, eletriptan, aimovig, zofran, imipramine. Methacarbamol, trigger point injections.  Fentanyl has been most helpful and she has received this from [facility].     HIT6:72 Number of days in the last 4 weeks with:  Severe headache: 16 Moderate headache: 8 Mild headache: 1  No headache: 3   Past Medical History:  Diagnosis Date  . Anemia   . Anxiety    takes lexapro  . Asthma    prn inhaler use  . HA (headache)   . Migraine   . Occipital neuralgia   . Sinus tachycardia   . SVT (supraventricular tachycardia) ([facility])     Social History   Socioeconomic History  . Marital status: Single    Spouse name: Not on file  . Number of children: Not on file  . Years of education: Not on file  . Highest education level: Not on file  Occupational History  . Not on file  Social Needs  . Financial resource strain: Not on file  . Food insecurity    Worry: Not on file    Inability: Not on file  . Transportation needs    Medical: Not on file    Non-medical: Not on file  Tobacco Use  . Smoking status: Never Smoker  . Smokeless tobacco: Never Used  Substance and Sexual Activity  . Alcohol use: Not Currently    Alcohol/week: 1.0 standard drinks    Types: 1 Glasses of wine per week    Frequency: Never  . Drug use: Never  . Sexual activity: Yes  Lifestyle  . Physical activity    Days per  week: Not on file    Minutes per session: Not on file  . Stress: Not on file  Relationships  . Social Herbalist on phone: Not on file    Gets together: Not on file    Attends religious service: Not on file    Active member of club or organization: Not on file    Attends meetings of clubs or organizations: Not on file    Relationship status: Not on file  . Intimate partner violence    Fear of current or ex partner: Not on file    Emotionally abused: Not on file    Physically abused: Not on file    Forced sexual activity: Not on file  Other Topics Concern  . Not on file  Social History Narrative   EMT    Family History  Problem Relation Age of Onset  . Hypertension Mother   . Transient ischemic attack Mother   . Diabetes Brother        Type 1  . Breast cancer Maternal Aunt   . Cancer Maternal Aunt     Allergies  Allergen Reactions  . Nitrous Oxide Anaphylaxis  . Peach Flavor Anaphylaxis  .  Shellfish Allergy Anaphylaxis  . Adhesive [Tape] Hives, Itching and Other (See Comments)    blisters  . Reglan [Metoclopramide]     Muscle spasms. Pt takes med at home    Current Outpatient Medications on File Prior to Visit  Medication Sig Dispense Refill  . cyclobenzaprine (FLEXERIL) 10 MG tablet Take 1 tablet (10 mg total) by mouth 3 (three) times daily as needed for muscle spasms. 30 tablet 0  . escitalopram (LEXAPRO) 10 MG tablet Take 1 tablet (10 mg total) by mouth daily. 30 tablet 3  . famotidine (PEPCID) 20 MG tablet Take 1 tablet (20 mg total) by mouth 2 (two) times daily. 60 tablet 3  . ferrous sulfate 325 (65 FE) MG tablet Take 325 mg by mouth 3 (three) times daily.    . Magnesium 400 MG TABS Take 400 mg by mouth daily. 30 tablet 0  . metoCLOPramide (REGLAN) 10 MG tablet Take 1 tablet (10 mg total) by mouth 3 (three) times daily before meals. 30 tablet 2  . Prenatal Vit-Fe Fumarate-FA (PRENATAL MULTIVITAMIN) TABS tablet Take 1 tablet by mouth daily at 12 noon.     . promethazine (PHENERGAN) 25 MG suppository Place 1 suppository (25 mg total) rectally every 6 (six) hours as needed for nausea or vomiting. 60 each 5  . zolpidem (AMBIEN) 5 MG tablet Take 1 tablet (5 mg total) by mouth at bedtime as needed for sleep. 5 tablet 0  . propranolol (INDERAL) 10 MG tablet TAKE 1 TABLET BY MOUTH DAILY AS NEEDED (Patient not taking: No sig reported) 30 tablet 10   No current facility-administered medications on file prior to visit.      Review of Systems:  All pertinent positive/negative included in HPI, all other review of systems are negative   Objective:  Physical Exam LMP 06/01/2019  CONSTITUTIONAL: Well-developed, well-nourished female in no acute distress.  EYES: EOM intact ENT: Normocephalic RESPIRATORY: Normal rate of 20. MUSCULOSKELETAL: Normal ROM SKIN: Warm, dry without erythema  NEUROLOGICAL: Alert, oriented, CN II-XII grossly intact, Appropriate balance PSYCH: Normal behavior, mood   Assessment & Plan:  Assessment: 1. Pregnancy headache, antepartum   2. Chronic migraine   new diagnoses   Plan: Pt advised of limited options in pregnancy Lifestyle discussed.  Schedule is important to maintain for eating/sleeping/exercise/etc.   Eat/drink as much as able.  Dehydration is likely a significant factor with the migraines Fioricet for acute migraine- limit to 2 days per week Flexeril is another option for migraine/muscle spasm  Or to help her sleep - sedation precautions discussed Reglan and Phenergan may also help with the migraine - but should not be taken together OTC Benadryl okay to use for migraine Acupuncture/massage/chiropractor all may be beneficial  Biofreeze advised - liberal use but careful around the eyes Pt advised we can do injections as needed but she is wary as they have not helped in the past at other clinic.   RTC PRN  Bertram Denver, PA-C 10/10/2019 10:51 AM

## 2019-10-13 ENCOUNTER — Ambulatory Visit (HOSPITAL_COMMUNITY)
Admission: RE | Admit: 2019-10-13 | Discharge: 2019-10-13 | Disposition: A | Discharge status: Home / Self Care | Payer: 59 | Source: Ambulatory Visit | Attending: Family Medicine | Admitting: Family Medicine

## 2019-10-13 ENCOUNTER — Other Ambulatory Visit: Payer: Self-pay

## 2019-10-13 ENCOUNTER — Encounter: Payer: Self-pay | Admitting: Physician Assistant

## 2019-10-13 DIAGNOSIS — Z3A00 Weeks of gestation of pregnancy not specified: Secondary | ICD-10-CM | POA: Diagnosis not present

## 2019-10-13 DIAGNOSIS — O210 Mild hyperemesis gravidarum: Secondary | ICD-10-CM | POA: Diagnosis not present

## 2019-10-13 MED ORDER — PROMETHAZINE HCL 25 MG/ML IJ SOLN
25.0000 mg | INTRAVENOUS | Status: DC
  Administered 2019-10-13: 25 mg via INTRAVENOUS
  Filled 2019-10-13: qty 1

## 2019-10-13 NOTE — Patient Instructions (Signed)

## 2019-10-15 ENCOUNTER — Other Ambulatory Visit: Payer: Self-pay

## 2019-10-15 ENCOUNTER — Ambulatory Visit (HOSPITAL_COMMUNITY)
Admission: RE | Admit: 2019-10-15 | Discharge: 2019-10-15 | Disposition: A | Discharge status: Home / Self Care | Payer: 59 | Source: Ambulatory Visit | Attending: Family Medicine | Admitting: Family Medicine

## 2019-10-15 DIAGNOSIS — O210 Mild hyperemesis gravidarum: Secondary | ICD-10-CM | POA: Diagnosis present

## 2019-10-15 MED ORDER — PROMETHAZINE HCL 25 MG/ML IJ SOLN
25.0000 mg | INTRAVENOUS | Status: DC
  Administered 2019-10-15: 25 mg via INTRAVENOUS
  Filled 2019-10-15: qty 1

## 2019-10-16 ENCOUNTER — Encounter (HOSPITAL_BASED_OUTPATIENT_CLINIC_OR_DEPARTMENT_OTHER): Payer: Self-pay | Admitting: *Deleted

## 2019-10-16 ENCOUNTER — Emergency Department (HOSPITAL_BASED_OUTPATIENT_CLINIC_OR_DEPARTMENT_OTHER)
Admission: EM | Admit: 2019-10-16 | Discharge: 2019-10-16 | Disposition: A | Discharge status: Home / Self Care | Payer: 59 | Attending: Emergency Medicine | Admitting: Emergency Medicine

## 2019-10-16 ENCOUNTER — Other Ambulatory Visit: Payer: Self-pay

## 2019-10-16 DIAGNOSIS — O99512 Diseases of the respiratory system complicating pregnancy, second trimester: Secondary | ICD-10-CM | POA: Diagnosis not present

## 2019-10-16 DIAGNOSIS — J45909 Unspecified asthma, uncomplicated: Secondary | ICD-10-CM | POA: Diagnosis not present

## 2019-10-16 DIAGNOSIS — Z888 Allergy status to other drugs, medicaments and biological substances status: Secondary | ICD-10-CM | POA: Insufficient documentation

## 2019-10-16 DIAGNOSIS — Z3A19 19 weeks gestation of pregnancy: Secondary | ICD-10-CM | POA: Insufficient documentation

## 2019-10-16 DIAGNOSIS — Z91013 Allergy to seafood: Secondary | ICD-10-CM | POA: Insufficient documentation

## 2019-10-16 DIAGNOSIS — Z79899 Other long term (current) drug therapy: Secondary | ICD-10-CM | POA: Diagnosis not present

## 2019-10-16 DIAGNOSIS — Z91048 Other nonmedicinal substance allergy status: Secondary | ICD-10-CM | POA: Insufficient documentation

## 2019-10-16 DIAGNOSIS — O219 Vomiting of pregnancy, unspecified: Secondary | ICD-10-CM | POA: Diagnosis present

## 2019-10-16 DIAGNOSIS — O210 Mild hyperemesis gravidarum: Secondary | ICD-10-CM

## 2019-10-16 LAB — CBC WITH DIFFERENTIAL/PLATELET
Abs Immature Granulocytes: 0.31 10*3/uL — ABNORMAL HIGH (ref 0.00–0.07)
Basophils Absolute: 0.1 10*3/uL (ref 0.0–0.1)
Basophils Relative: 0 %
Eosinophils Absolute: 0.2 10*3/uL (ref 0.0–0.5)
Eosinophils Relative: 2 %
HCT: 33 % — ABNORMAL LOW (ref 36.0–46.0)
Hemoglobin: 11.1 g/dL — ABNORMAL LOW (ref 12.0–15.0)
Immature Granulocytes: 2 %
Lymphocytes Relative: 10 %
Lymphs Abs: 1.5 10*3/uL (ref 0.7–4.0)
MCH: 31.3 pg (ref 26.0–34.0)
MCHC: 33.6 g/dL (ref 30.0–36.0)
MCV: 93 fL (ref 80.0–100.0)
Monocytes Absolute: 0.9 10*3/uL (ref 0.1–1.0)
Monocytes Relative: 6 %
Neutro Abs: 12.2 10*3/uL — ABNORMAL HIGH (ref 1.7–7.7)
Neutrophils Relative %: 80 %
Platelets: 295 10*3/uL (ref 150–400)
RBC: 3.55 MIL/uL — ABNORMAL LOW (ref 3.87–5.11)
RDW: 12.7 % (ref 11.5–15.5)
WBC: 15.2 10*3/uL — ABNORMAL HIGH (ref 4.0–10.5)
nRBC: 0 % (ref 0.0–0.2)

## 2019-10-16 LAB — COMPREHENSIVE METABOLIC PANEL
ALT: 27 U/L (ref 0–44)
AST: 20 U/L (ref 15–41)
Albumin: 3.2 g/dL — ABNORMAL LOW (ref 3.5–5.0)
Alkaline Phosphatase: 44 U/L (ref 38–126)
Anion gap: 9 (ref 5–15)
BUN: 7 mg/dL (ref 6–20)
CO2: 19 mmol/L — ABNORMAL LOW (ref 22–32)
Calcium: 8.7 mg/dL — ABNORMAL LOW (ref 8.9–10.3)
Chloride: 104 mmol/L (ref 98–111)
Creatinine, Ser: 0.41 mg/dL — ABNORMAL LOW (ref 0.44–1.00)
GFR calc Af Amer: >60 mL/min (ref >60)
GFR calc non Af Amer: >60 mL/min (ref >60)
Glucose, Bld: 87 mg/dL (ref 70–99)
Potassium: 4.3 mmol/L (ref 3.5–5.1)
Sodium: 132 mmol/L — ABNORMAL LOW (ref 135–145)
Total Bilirubin: 0.3 mg/dL (ref 0.3–1.2)
Total Protein: 6.1 g/dL — ABNORMAL LOW (ref 6.5–8.1)

## 2019-10-16 LAB — URINALYSIS, ROUTINE W REFLEX MICROSCOPIC
Bilirubin Urine: NEGATIVE
Glucose, UA: NEGATIVE mg/dL
Hgb urine dipstick: NEGATIVE
Ketones, ur: 15 mg/dL — AB
Leukocytes,Ua: NEGATIVE
Nitrite: NEGATIVE
Protein, ur: NEGATIVE mg/dL
Specific Gravity, Urine: 1.015 (ref 1.005–1.030)
pH: 7.5 (ref 5.0–8.0)

## 2019-10-16 MED ORDER — PROMETHAZINE HCL 25 MG/ML IJ SOLN
25.0000 mg | Freq: Once | INTRAMUSCULAR | Status: AC
  Administered 2019-10-16: 25 mg via INTRAVENOUS

## 2019-10-16 MED ORDER — DIPHENHYDRAMINE HCL 50 MG/ML IJ SOLN
25.0000 mg | Freq: Once | INTRAMUSCULAR | Status: AC
  Administered 2019-10-16: 25 mg via INTRAVENOUS
  Filled 2019-10-16: qty 1

## 2019-10-16 MED ORDER — METOCLOPRAMIDE HCL 5 MG/ML IJ SOLN
10.0000 mg | Freq: Once | INTRAMUSCULAR | Status: AC
  Administered 2019-10-16: 10 mg via INTRAVENOUS
  Filled 2019-10-16: qty 2

## 2019-10-16 MED ORDER — LACTATED RINGERS IV BOLUS
1000.0000 mL | Freq: Once | INTRAVENOUS | Status: AC
  Administered 2019-10-16: 16:00:00 1000 mL via INTRAVENOUS

## 2019-10-16 MED ORDER — LACTATED RINGERS IV BOLUS
1000.0000 mL | Freq: Once | INTRAVENOUS | Status: AC
  Administered 2019-10-16: 1000 mL via INTRAVENOUS

## 2019-10-16 MED ORDER — PROMETHAZINE HCL 25 MG/ML IJ SOLN
INTRAMUSCULAR | Status: AC
  Filled 2019-10-16: qty 1

## 2019-10-16 NOTE — ED Notes (Signed)
ED Provider at bedside. 

## 2019-10-16 NOTE — ED Triage Notes (Addendum)
Vomiting since 6am. No pain. States she is [redacted] weeks pregnant and gets IV fluids and Phenergan every other day. IV fluids yesterday.

## 2019-10-16 NOTE — Discharge Instructions (Signed)
Please keep your appointment tomorrow for fluids.  Return to the emergency department any new or suddenly worsening symptoms.

## 2019-10-16 NOTE — ED Notes (Signed)
Ambulatory to bathroom without difficulty. Patient given warm blanket upon return from bathroom.

## 2019-10-16 NOTE — ED Provider Notes (Signed)
[facility] EMERGENCY DEPARTMENT Provider Note   CSN: [id number] Arrival date & time: 10/16/19  1146     History Chief Complaint  Patient presents with  . Emesis During Pregnancy    Michelle Vincent is a 28 y.o. female.  HPI       28 year old female [redacted] weeks pregnant presents with concern for nausea and vomiting since 6 AM today.  She has a history of hyperemesis, and has had an continued nausea and vomiting during the second trimester, although reports overall it has been better controlled on it was.  She has been going through her OB to have IV infusions of Phenergan and lactated Ringer's which helped her tolerate po and eat and have helped control the nausea.  However, this morning, woke up with severe nausea and vomiting again.  Reports vomiting this 20 times.  Reports had some brief abdominal cramping, but denies any other significant abdominal pain.  Denies diarrhea.  Denies dysuria.  Denies fevers, cough.  She denies any recent discontinuation of medication.  Denies marijuana use.  Denies having episodes of vomiting prior to the pregnancy.  Reports that she declined to have her nausea better controlled will be able to eat and drink.  Tried po nausea medication without relief.   Past Medical History:  Diagnosis Date  . Anemia   . Anxiety    takes lexapro  . Asthma    prn inhaler use  . HA (headache)   . Migraine   . Occipital neuralgia   . Sinus tachycardia   . SVT (supraventricular tachycardia) [facility])     Patient Active Problem List   Diagnosis Date Noted  . Hyperemesis gravidarum 08/29/2019  . Supervision of normal first pregnancy, antepartum 08/12/2019  . Allergic rhinitis 08/12/2019  . Tachycardia 03/03/2019  . 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 Mild intermittent asthma without complication 12/27/2017  . Chronic migraine without aura without status migrainosus, not intractable 05/24/2017  . 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 shoulder Right   . TONSILLECTOMY AND ADENOIDECTOMY       OB History    Gravida  1   Para  0   Term  0   Preterm  0   AB  0   Living  0     SAB  0   TAB  0   Ectopic  0   Multiple  0   Live Births  0           Family History  Problem Relation Age of Onset  . Hypertension Mother   . Transient ischemic attack Mother   . Diabetes Brother        Type 1  . Breast cancer Maternal Aunt   . Cancer Maternal Aunt     Social History   Tobacco Use  . Smoking status: Never Smoker  . Smokeless tobacco: Never Used  Substance Use Topics  . Alcohol use: Not Currently  . Drug use: Never    Home Medications Prior to Admission medications   Medication Sig Start Date End Date Taking? Authorizing Provider  Butalbital-APAP-Caffeine 50-325-40 MG capsule Take 1-2 capsules by mouth every 6 (six) hours as needed for headache. 10/10/19   Glyn Ade, Scot Jun, PA-C  cyclobenzaprine (FLEXERIL) 10 MG tablet Take 1 tablet (10 mg total) by mouth 3 (three) times daily as needed for muscle spasms. 10/10/19   Glyn Ade, Scot Jun, PA-C  escitalopram (LEXAPRO)  10 MG tablet Take 1 tablet (10 mg total) by mouth daily. 09/09/19   Levie HeritageStinson, Jacob J, DO  famotidine (PEPCID) 20 MG tablet Take 1 tablet (20 mg total) by mouth 2 (two) times daily. 09/09/19   Levie HeritageStinson, Jacob J, DO  ferrous sulfate 325 (65 FE) MG tablet Take 325 mg by mouth 3 (three) times daily. 12/04/14   [provider]  Magnesium 400 MG TABS Take 400 mg by mouth daily. 09/29/19   Jacalyn LefevreHaviland, Julie, MD  metoCLOPramide (REGLAN) 10 MG tablet Take 1 tablet (10 mg total) by mouth 3 (three) times daily before meals. 09/15/19   Rasch, Harolyn RutherfordJennifer I, NP  Prenatal Vit-Fe Fumarate-FA (PRENATAL MULTIVITAMIN) TABS tablet Take 1 tablet by mouth daily at 12 noon.    [provider]  promethazine (PHENERGAN) 25 MG suppository Place 1 suppository (25 mg total) rectally every 6 (six) hours  as needed for nausea or vomiting. 09/23/19   Levie HeritageStinson, Jacob J, DO  propranolol (INDERAL) 10 MG tablet TAKE 1 TABLET BY MOUTH DAILY AS NEEDED Patient not taking: No sig reported 04/15/19   Rollene RotundaHochrein, James, MD  zolpidem (AMBIEN) 5 MG tablet Take 1 tablet (5 mg total) by mouth at bedtime as needed for sleep. 09/28/19   Benjiman CorePickering, Nathan, MD    Allergies    Nitrous oxide, Peach flavor, Shellfish allergy, and Adhesive [tape]  Review of Systems   Review of Systems  Constitutional: Negative for fever.  HENT: Negative for sore throat.   Eyes: Negative for visual disturbance.  Respiratory: Negative for cough and shortness of breath.   Cardiovascular: Negative for chest pain.  Gastrointestinal: Positive for nausea and vomiting. Negative for abdominal pain, constipation and diarrhea.  Genitourinary: Negative for difficulty urinating and dysuria.  Musculoskeletal: Negative for back pain and neck pain.  Skin: Negative for rash.  Neurological: Negative for syncope and headaches.    Physical Exam Updated Vital Signs BP 108/70 (BP Location: Right Arm)   Pulse 96   Temp 98 F (36.7 C) (Oral)   Resp 16   Ht 5\' 7"  (1.702 m)   Wt 75.4 kg   LMP 06/01/2019   SpO2 100%   BMI 26.03 kg/m   Physical Exam Vitals and nursing note reviewed.  Constitutional:      General: She is not in acute distress.    Appearance: She is well-developed. She is not diaphoretic.  HENT:     Head: Normocephalic and atraumatic.  Eyes:     Conjunctiva/sclera: Conjunctivae normal.  Cardiovascular:     Rate and Rhythm: Normal rate and regular rhythm.     Pulses: Normal pulses.  Pulmonary:     Effort: Pulmonary effort is normal. No respiratory distress.  Abdominal:     General: There is no distension.     Palpations: Abdomen is soft.     Tenderness: There is no abdominal tenderness. There is no guarding.  Musculoskeletal:        General: No tenderness.     Cervical back: Normal range of motion.  Skin:    General:  Skin is warm and dry.     Findings: No erythema or rash.  Neurological:     Mental Status: She is alert and oriented to person, place, and time.     ED Results / Procedures / Treatments   Labs (all labs ordered are listed, but only abnormal results are displayed) Labs Reviewed  URINALYSIS, ROUTINE W REFLEX MICROSCOPIC - Abnormal; Notable for the following components:  Result Value   Ketones, ur 15 (*)    All other components within normal limits  CBC WITH DIFFERENTIAL/PLATELET - Abnormal; Notable for the following components:   WBC 15.2 (*)    RBC 3.55 (*)    Hemoglobin 11.1 (*)    HCT 33.0 (*)    Neutro Abs 12.2 (*)    Abs Immature Granulocytes 0.31 (*)    All other components within normal limits  COMPREHENSIVE METABOLIC PANEL - Abnormal; Notable for the following components:   Sodium 132 (*)    CO2 19 (*)    Creatinine, Ser 0.41 (*)    Calcium 8.7 (*)    Total Protein 6.1 (*)    Albumin 3.2 (*)    All other components within normal limits  URINE CULTURE    EKG None  Radiology No results found.  Procedures Procedures (including critical care time)  Medications Ordered in ED Medications  lactated ringers bolus 1,000 mL (0 mLs Intravenous Stopped 10/16/19 1648)  promethazine (PHENERGAN) injection 25 mg (25 mg Intravenous Given 10/16/19 1406)  metoCLOPramide (REGLAN) injection 10 mg (10 mg Intravenous Given 10/16/19 1542)  diphenhydrAMINE (BENADRYL) injection 25 mg (25 mg Intravenous Given 10/16/19 1540)  lactated ringers bolus 1,000 mL (0 mLs Intravenous Stopped 10/16/19 1648)    ED Course  I have reviewed the triage vital signs and the nursing notes.  Pertinent labs & imaging results that were available during my care of the patient were reviewed by me and considered in my medical decision making (see chart for details).    MDM Rules/Calculators/[name]     28 year old female [redacted] weeks pregnant presents with concern for nausea and vomiting since 6 AM  today.  Labs show no significant electrolyte abnormalities or signs of urinary tract infection.  Abdominal exam benign, nontender, no sign of acute intra-abdominal pathology, doubt ill.  Fluids and Phenergan, with continued nausea.  Will give Reglan and Benadryl for additional.  Signed out to Dr. Laverta Baltimore with continued nausea control pending.  Final Clinical Impression(s) / ED Diagnoses Final diagnoses:  Hyperemesis gravidarum    Rx / DC Orders ED Discharge Orders    None       Gareth Morgan, MD 10/16/19 2215

## 2019-10-16 NOTE — ED Provider Notes (Signed)
Blood pressure 120/77, pulse 97, temperature 98.6 F (37 C), temperature source Oral, resp. rate 16, height 5\' 7"  (1.702 m), weight 75.4 kg, last menstrual period 06/01/2019, SpO2 100 %.  In short, Michelle Vincent is a 28 y.o. female with a chief complaint of Emesis During Pregnancy .  Refer to the original H&P for additional details.  05:19 PM  Patient feeling somewhat improved after IV fluids.  She has been able to eat saltines and drink some fluids without vomiting.  She continues to have some low-grade nausea.  She has an appointment with her GYN tomorrow for IV fluids and Phenergan as scheduled.  Discussed ED return precautions.  Patient feels comfortable with discharge at this time.     Margette Fast, MD 10/16/19 1719

## 2019-10-17 ENCOUNTER — Ambulatory Visit (HOSPITAL_COMMUNITY)
Admission: RE | Admit: 2019-10-17 | Discharge: 2019-10-17 | Disposition: A | Discharge status: Home / Self Care | Payer: 59 | Source: Ambulatory Visit | Attending: Family Medicine | Admitting: Family Medicine

## 2019-10-17 DIAGNOSIS — O210 Mild hyperemesis gravidarum: Secondary | ICD-10-CM | POA: Diagnosis not present

## 2019-10-17 LAB — URINE CULTURE: Culture: NO GROWTH

## 2019-10-17 MED ORDER — PROMETHAZINE HCL 25 MG/ML IJ SOLN
25.0000 mg | INTRAVENOUS | Status: DC
  Administered 2019-10-17: 25 mg via INTRAVENOUS
  Filled 2019-10-17: qty 1

## 2019-10-20 ENCOUNTER — Telehealth: Payer: Self-pay

## 2019-10-20 ENCOUNTER — Other Ambulatory Visit: Payer: Self-pay

## 2019-10-20 ENCOUNTER — Ambulatory Visit (HOSPITAL_COMMUNITY)
Admission: RE | Admit: 2019-10-20 | Discharge: 2019-10-20 | Disposition: A | Discharge status: Home / Self Care | Payer: 59 | Source: Ambulatory Visit | Attending: Family Medicine | Admitting: Family Medicine

## 2019-10-20 DIAGNOSIS — O210 Mild hyperemesis gravidarum: Secondary | ICD-10-CM | POA: Diagnosis present

## 2019-10-20 DIAGNOSIS — Z3A00 Weeks of gestation of pregnancy not specified: Secondary | ICD-10-CM | POA: Diagnosis not present

## 2019-10-20 MED ORDER — PROMETHAZINE HCL 25 MG/ML IJ SOLN
25.0000 mg | INTRAVENOUS | Status: DC
  Administered 2019-10-20: 25 mg via INTRAVENOUS
  Filled 2019-10-20: qty 1

## 2019-10-20 NOTE — Telephone Encounter (Signed)
12/14 Mychart message:  "Dr Percival Spanish,  I wanted to check with you. My heart rate has been going up again to about 120 resting, I do my propanolol as you said, ( as I feel I need it) but its going to where i have to take it everyday and my heart rate is still occasionally going up with resting. What would you like for me to do?   Thanks  Michelle Vincent"

## 2019-10-20 NOTE — Telephone Encounter (Signed)
It would be prudent to take this twice daily morning and about eight hours later.   Let us see how you do with that.

## 2019-10-22 ENCOUNTER — Other Ambulatory Visit: Payer: Self-pay

## 2019-10-22 ENCOUNTER — Ambulatory Visit (HOSPITAL_COMMUNITY)
Admission: RE | Admit: 2019-10-22 | Discharge: 2019-10-22 | Disposition: A | Discharge status: Home / Self Care | Payer: 59 | Source: Ambulatory Visit | Attending: Family Medicine | Admitting: Family Medicine

## 2019-10-22 ENCOUNTER — Ambulatory Visit (INDEPENDENT_AMBULATORY_CARE_PROVIDER_SITE_OTHER): Payer: 59 | Admitting: Obstetrics & Gynecology

## 2019-10-22 VITALS — BP 113/56 | HR 92 | Wt 172.0 lb

## 2019-10-22 DIAGNOSIS — O210 Mild hyperemesis gravidarum: Secondary | ICD-10-CM | POA: Insufficient documentation

## 2019-10-22 DIAGNOSIS — Z3400 Encounter for supervision of normal first pregnancy, unspecified trimester: Secondary | ICD-10-CM

## 2019-10-22 DIAGNOSIS — R000 Tachycardia, unspecified: Secondary | ICD-10-CM

## 2019-10-22 DIAGNOSIS — Z3A20 20 weeks gestation of pregnancy: Secondary | ICD-10-CM

## 2019-10-22 DIAGNOSIS — O219 Vomiting of pregnancy, unspecified: Secondary | ICD-10-CM

## 2019-10-22 DIAGNOSIS — J4520 Mild intermittent asthma, uncomplicated: Secondary | ICD-10-CM

## 2019-10-22 MED ORDER — LABETALOL HCL 100 MG PO TABS: 200.0000 mg | ORAL_TABLET | Freq: Two times a day (BID) | ORAL | 1 refills | Status: DC

## 2019-10-22 MED ORDER — PROMETHAZINE HCL 25 MG/ML IJ SOLN
25.0000 mg | INTRAVENOUS | Status: DC
  Administered 2019-10-22: 25 mg via INTRAVENOUS
  Filled 2019-10-22: qty 1

## 2019-10-22 NOTE — Progress Notes (Signed)
   PRENATAL VISIT NOTE  Subjective:  Michelle Vincent is a 28 y.o. G1P0000 at [redacted]w[redacted]d being seen today for ongoing prenatal care.  She is currently monitored for the following issues for this high-risk pregnancy and has Tachycardia; Supervision of normal first pregnancy, antepartum; Inappropriate sinus tachycardia; Allergic rhinitis; Chronic migraine without aura without status migrainosus, not intractable; Mild intermittent asthma without complication; Shift work sleep disorder; and Hyperemesis gravidarum on their problem list.  Patient reports continued N/V.  Contractions: Not present. Vag. Bleeding: None.  Movement: Present. Denies leaking of fluid.   The following portions of the patient's history were reviewed and updated as appropriate: allergies, current medications, past family history, past medical history, past social history, past surgical history and problem list.   Objective:   Vitals:   10/22/19 1527  BP: (!) 113/56  Pulse: 92  Weight: 172 lb (78 kg)    Fetal Status: Fetal Heart Rate (bpm): 135   Movement: Present     General:  Alert, oriented and cooperative. Patient is in no acute distress.  Skin: Skin is warm and dry. No rash noted.   Cardiovascular: Normal heart rate noted  Respiratory: Normal respiratory effort, no problems with respiration noted  Abdomen: Soft, gravid, appropriate for gestational age.  Pain/Pressure: Absent     Pelvic: Cervical exam deferred        Extremities: Normal range of motion.  Edema: Trace  Mental Status: Normal mood and affect. Normal behavior. Normal judgment and thought content.   Assessment and Plan:  Pregnancy: G1P0000 at [redacted]w[redacted]d 1. Supervision of normal first pregnancy, antepartum  - AFP, Serum, Open Spina Bifida  2. Mild intermittent asthma without complication  3. Inappropriate sinus tachycardia Will switch from Propranolol to Labetalol.   4. Nausea and emesis On IV Phenergan 3x/week  Preterm labor symptoms and general  obstetric precautions including but not limited to vaginal bleeding, contractions, leaking of fluid and fetal movement were reviewed in detail with the patient. Please refer to After Visit Summary for other counseling recommendations.   Return in about 4 weeks (around 11/19/2019) for in person.  Future Appointments  Date Time Provider [place]  10/24/2019 12:00 PM [facility] ROOM 12 [facility] None    Lavonia Drafts, MD

## 2019-10-23 ENCOUNTER — Encounter: Payer: 59 | Admitting: Obstetrics & Gynecology

## 2019-10-23 NOTE — Telephone Encounter (Signed)
Recommendation sent to pt via mychart on 12/15

## 2019-10-23 NOTE — Addendum Note (Signed)
Addended by: Phill Myron on: 10/23/2019 09:28 AM   Modules accepted: Orders

## 2019-10-24 ENCOUNTER — Encounter: Payer: 59 | Admitting: Obstetrics & Gynecology

## 2019-10-24 ENCOUNTER — Encounter (HOSPITAL_COMMUNITY)
Admission: RE | Admit: 2019-10-24 | Discharge: 2019-10-24 | Disposition: A | Discharge status: Home / Self Care | Payer: 59 | Source: Ambulatory Visit | Attending: Family Medicine | Admitting: Family Medicine

## 2019-10-24 ENCOUNTER — Other Ambulatory Visit: Payer: Self-pay

## 2019-10-24 DIAGNOSIS — O210 Mild hyperemesis gravidarum: Secondary | ICD-10-CM | POA: Insufficient documentation

## 2019-10-24 MED ORDER — PROMETHAZINE HCL 25 MG/ML IJ SOLN
25.0000 mg | INTRAVENOUS | Status: DC
  Administered 2019-10-24: 25 mg via INTRAVENOUS
  Filled 2019-10-24: qty 1

## 2019-10-25 LAB — AFP, SERUM, OPEN SPINA BIFIDA
AFP MoM: 1.19
AFP Value: 65.2 ng/mL
Gest. Age on Collection Date: 20.4 weeks
Maternal Age At EDD: 27.3 yr
OSBR Risk 1 IN: 6704
Test Results:: NEGATIVE
Weight: 172 [lb_av]

## 2019-10-27 ENCOUNTER — Other Ambulatory Visit: Payer: Self-pay

## 2019-10-27 ENCOUNTER — Other Ambulatory Visit: Payer: Self-pay | Admitting: Family Medicine

## 2019-10-27 ENCOUNTER — Ambulatory Visit (HOSPITAL_COMMUNITY)
Admission: RE | Admit: 2019-10-27 | Discharge: 2019-10-27 | Disposition: A | Discharge status: Home / Self Care | Payer: 59 | Source: Ambulatory Visit | Attending: Family Medicine | Admitting: Family Medicine

## 2019-10-27 DIAGNOSIS — Z3A00 Weeks of gestation of pregnancy not specified: Secondary | ICD-10-CM | POA: Diagnosis not present

## 2019-10-27 DIAGNOSIS — O210 Mild hyperemesis gravidarum: Secondary | ICD-10-CM | POA: Insufficient documentation

## 2019-10-27 MED ORDER — PROMETHAZINE HCL 25 MG/ML IJ SOLN
25.0000 mg | INTRAVENOUS | Status: DC
  Administered 2019-10-27: 25 mg via INTRAVENOUS
  Filled 2019-10-27: qty 1

## 2019-10-28 ENCOUNTER — Encounter (HOSPITAL_COMMUNITY): Payer: Self-pay | Admitting: Obstetrics and Gynecology

## 2019-10-28 ENCOUNTER — Other Ambulatory Visit: Payer: Self-pay

## 2019-10-28 ENCOUNTER — Inpatient Hospital Stay (HOSPITAL_COMMUNITY)
Admission: AD | Admit: 2019-10-28 | Discharge: 2019-10-28 | Disposition: A | Discharge status: Home / Self Care | Payer: 59 | Attending: Obstetrics and Gynecology | Admitting: Obstetrics and Gynecology

## 2019-10-28 DIAGNOSIS — O99342 Other mental disorders complicating pregnancy, second trimester: Secondary | ICD-10-CM | POA: Insufficient documentation

## 2019-10-28 DIAGNOSIS — Z3A21 21 weeks gestation of pregnancy: Secondary | ICD-10-CM | POA: Insufficient documentation

## 2019-10-28 DIAGNOSIS — O99512 Diseases of the respiratory system complicating pregnancy, second trimester: Secondary | ICD-10-CM | POA: Insufficient documentation

## 2019-10-28 DIAGNOSIS — O210 Mild hyperemesis gravidarum: Secondary | ICD-10-CM

## 2019-10-28 DIAGNOSIS — Z91013 Allergy to seafood: Secondary | ICD-10-CM | POA: Insufficient documentation

## 2019-10-28 DIAGNOSIS — R1110 Vomiting, unspecified: Secondary | ICD-10-CM | POA: Diagnosis present

## 2019-10-28 DIAGNOSIS — Z833 Family history of diabetes mellitus: Secondary | ICD-10-CM | POA: Insufficient documentation

## 2019-10-28 DIAGNOSIS — F419 Anxiety disorder, unspecified: Secondary | ICD-10-CM | POA: Insufficient documentation

## 2019-10-28 DIAGNOSIS — O211 Hyperemesis gravidarum with metabolic disturbance: Secondary | ICD-10-CM | POA: Insufficient documentation

## 2019-10-28 DIAGNOSIS — Z8249 Family history of ischemic heart disease and other diseases of the circulatory system: Secondary | ICD-10-CM | POA: Diagnosis not present

## 2019-10-28 DIAGNOSIS — J45909 Unspecified asthma, uncomplicated: Secondary | ICD-10-CM | POA: Insufficient documentation

## 2019-10-28 LAB — BASIC METABOLIC PANEL
Anion gap: 10 (ref 5–15)
BUN: <5 mg/dL — ABNORMAL LOW (ref 6–20)
CO2: 21 mmol/L — ABNORMAL LOW (ref 22–32)
Calcium: 8.8 mg/dL — ABNORMAL LOW (ref 8.9–10.3)
Chloride: 106 mmol/L (ref 98–111)
Creatinine, Ser: 0.57 mg/dL (ref 0.44–1.00)
GFR calc Af Amer: >60 mL/min (ref >60)
GFR calc non Af Amer: >60 mL/min (ref >60)
Glucose, Bld: 89 mg/dL (ref 70–99)
Potassium: 4.2 mmol/L (ref 3.5–5.1)
Sodium: 137 mmol/L (ref 135–145)

## 2019-10-28 LAB — URINALYSIS, ROUTINE W REFLEX MICROSCOPIC
Bilirubin Urine: NEGATIVE
Glucose, UA: NEGATIVE mg/dL
Hgb urine dipstick: NEGATIVE
Ketones, ur: 20 mg/dL — AB
Leukocytes,Ua: NEGATIVE
Nitrite: NEGATIVE
Protein, ur: 30 mg/dL — AB
Specific Gravity, Urine: 1.014 (ref 1.005–1.030)
pH: 8 (ref 5.0–8.0)

## 2019-10-28 MED ORDER — FAMOTIDINE IN NACL 20-0.9 MG/50ML-% IV SOLN
20.0000 mg | Freq: Once | INTRAVENOUS | Status: AC
  Administered 2019-10-28: 12:00:00 20 mg via INTRAVENOUS
  Filled 2019-10-28: qty 50

## 2019-10-28 MED ORDER — LACTATED RINGERS IV BOLUS
1000.0000 mL | Freq: Once | INTRAVENOUS | Status: AC
  Administered 2019-10-28: 1000 mL via INTRAVENOUS

## 2019-10-28 MED ORDER — SODIUM CHLORIDE 0.9 % IV SOLN
8.0000 mg | Freq: Once | INTRAVENOUS | Status: AC
  Administered 2019-10-28: 8 mg via INTRAVENOUS
  Filled 2019-10-28: qty 4

## 2019-10-28 MED ORDER — ONDANSETRON HCL 8 MG PO TABS: 8.0000 mg | ORAL_TABLET | Freq: Three times a day (TID) | ORAL | 0 refills | Status: DC | PRN

## 2019-10-28 NOTE — MAU Note (Signed)
.   Michelle Vincent is a 28 y.o. at [redacted]w[redacted]d here in MAU reporting: vomiting since yesterday morning around 9 am. Denies any pain  Onset of complaint: 10/27/19  0900 Pain score: 0 Vitals:   10/28/19 1021  BP: 112/64  Pulse: (!) 111  Resp: 18  Temp: 97.6 F (36.4 C)  SpO2: 98%     FHT:145 Lab orders placed from triage: UA

## 2019-10-28 NOTE — Discharge Instructions (Signed)
Hyperemesis Gravidarum °Hyperemesis gravidarum is a severe form of nausea and vomiting that happens during pregnancy. Hyperemesis is worse than morning sickness. It may cause you to have nausea or vomiting all day for many days. It may keep you from eating and drinking enough food and liquids, which can lead to dehydration, malnutrition, and weight loss. Hyperemesis usually occurs during the first half (the first 20 weeks) of pregnancy. It often goes away once a woman is in her second half of pregnancy. However, sometimes hyperemesis continues through an entire pregnancy. °What are the causes? °The cause of this condition is not known. It may be related to changes in chemicals (hormones) in the body during pregnancy, such as the high level of pregnancy hormone (human chorionic gonadotropin) or the increase in the female sex hormone (estrogen). °What are the signs or symptoms? °Symptoms of this condition include: °· Nausea that does not go away. °· Vomiting that does not allow you to keep any food down. °· Weight loss. °· Body fluid loss (dehydration). °· Having no desire to eat, or not liking food that you have previously enjoyed. °How is this diagnosed? °This condition may be diagnosed based on: °· A physical exam. °· Your medical history. °· Your symptoms. °· Blood tests. °· Urine tests. °How is this treated? °This condition is managed by controlling symptoms. This may include: °· Following an eating plan. This can help lessen nausea and vomiting. °· Taking prescription medicines. °An eating plan and medicines are often used together to help control symptoms. If medicines do not help relieve nausea and vomiting, you may need to receive fluids through an IV at the hospital. °Follow these instructions at home: °Eating and drinking ° °· Avoid the following: °? Drinking fluids with meals. Try not to drink anything during the 30 minutes before and after your meals. °? Drinking more than 1 cup of fluid at a  time. °? Eating foods that trigger your symptoms. These may include spicy foods, coffee, high-fat foods, very sweet foods, and acidic foods. °? Skipping meals. Nausea can be more intense on an empty stomach. If you cannot tolerate food, do not force it. Try sucking on ice chips or other frozen items and make up for missed calories later. °? Lying down within 2 hours after eating. °? Being exposed to environmental triggers. These may include food smells, smoky rooms, closed spaces, rooms with strong smells, warm or humid places, overly loud and noisy rooms, and rooms with motion or flickering lights. Try eating meals in a well-ventilated area that is free of strong smells. °? Quick and sudden changes in your movement. °? Taking iron pills and multivitamins that contain iron. If you take prescription iron pills, do not stop taking them unless your health care provider approves. °? Preparing food. The smell of food can spoil your appetite or trigger nausea. °· To help relieve your symptoms: °? Listen to your body. Everyone is different and has different preferences. Find what works best for you. °? Eat and drink slowly. °? Eat 5-6 small meals daily instead of 3 large meals. Eating small meals and snacks can help you avoid an empty stomach. °? In the morning, before getting out of bed, eat a couple of crackers to avoid moving around on an empty stomach. °? Try eating starchy foods as these are usually tolerated well. Examples include cereal, toast, bread, potatoes, pasta, rice, and pretzels. °? Include at least 1 serving of protein with your meals and snacks. Protein options include   lean meats, poultry, seafood, beans, nuts, nut butters, eggs, cheese, and yogurt. °? Try eating a protein-rich snack before bed. Examples of a protein-rick snack include cheese and crackers or a peanut butter sandwich made with 1 slice of whole-wheat bread and 1 tsp (5 g) of peanut butter. °? Eat or suck on things that have ginger in them.  It may help relieve nausea. Add ¼ tsp ground ginger to hot tea or choose ginger tea. °? Try drinking 100% fruit juice or an electrolyte drink. An electrolyte drink contains sodium, potassium, and chloride. °? Drink fluids that are cold, clear, and carbonated or sour. Examples include lemonade, ginger ale, lemon-lime soda, ice water, and sparkling water. °? Brush your teeth or use a mouth rinse after meals. °? Talk with your health care provider about starting a supplement of vitamin B6. °General instructions °· Take over-the-counter and prescription medicines only as told by your health care provider. °· Follow instructions from your health care provider about eating or drinking restrictions. °· Continue to take your prenatal vitamins as told by your health care provider. If you are having trouble taking your prenatal vitamins, talk with your health care provider about different options. °· Keep all follow-up and pre-birth (prenatal) visits as told by your health care provider. This is important. °Contact a health care provider if: °· You have pain in your abdomen. °· You have a severe headache. °· You have vision problems. °· You are losing weight. °· You feel weak or dizzy. °Get help right away if: °· You cannot drink fluids without vomiting. °· You vomit blood. °· You have constant nausea and vomiting. °· You are very weak. °· You faint. °· You have a fever and your symptoms suddenly get worse. °Summary °· Hyperemesis gravidarum is a severe form of nausea and vomiting that happens during pregnancy. °· Making some changes to your eating habits may help relieve nausea and vomiting. °· This condition may be managed with medicine. °· If medicines do not help relieve nausea and vomiting, you may need to receive fluids through an IV at the hospital. °This information is not intended to replace advice given to you by your health care provider. Make sure you discuss any questions you have with your health care  provider. °Document Released: 10/23/2005 Document Revised: 11/12/2017 Document Reviewed: 06/21/2016 °Elsevier Patient Education © 2020 [employer] ° ° °

## 2019-10-28 NOTE — MAU Provider Note (Signed)
Chief Complaint: Emesis   First Provider Initiated Contact with Patient 10/28/19 1105     SUBJECTIVE HPI: Michelle Vincent is a 28 y.o. G1P0000 at [redacted]w[redacted]d who presents to [facility] reporting nausea & vomiting. Has HEG with this pregnancy. Has had good control of symptoms over the last 3 weeks. Receiving IV phenergan infusions 3 times per week; last infusion was yesterday. Reports vomiting 25 times yesterday and multiple times today. Vomited more after trying to drink water & ginger ale. Tried to eat soup but couldn't tolerate. Has taken reglan, phenergan suppository, and zofran since yesterday without relief. Denies fever, abdominal pain, vaginal bleeding, diarrhea, or constipation.    Past Medical History:  Diagnosis Date  . Anemia   . Anxiety    takes lexapro  . Asthma    prn inhaler use  . HA (headache)   . Migraine   . Occipital neuralgia   . Sinus tachycardia   . SVT (supraventricular tachycardia) (HCC)    OB History  Gravida Para Term Preterm AB Living  1 0 0 0 0 0  SAB TAB Ectopic Multiple Live Births  0 0 0 0 0    # Outcome Date GA Lbr Len/2nd Weight Sex Delivery Anes PTL Lv  1 Current            Past Surgical History:  Procedure Laterality Date  . shoulder Right   . TONSILLECTOMY AND ADENOIDECTOMY     Social History   Socioeconomic History  . Marital status: Single    Spouse name: Not on file  . Number of children: Not on file  . Years of education: Not on file  . Highest education level: Not on file  Occupational History  . Not on file  Tobacco Use  . Smoking status: Never Smoker  . Smokeless tobacco: Never Used  Substance and Sexual Activity  . Alcohol use: Not Currently  . Drug use: Never  . Sexual activity: Yes  Other Topics Concern  . Not on file  Social History Narrative   EMT   Social Determinants of Health   Financial Resource Strain:   . Difficulty of Paying Living Expenses: Not on file  Food Insecurity:   . Worried About  Programme researcher, broadcasting/film/video in the Last Year: Not on file  . Ran Out of Food in the Last Year: Not on file  Transportation Needs:   . Lack of Transportation (Medical): Not on file  . Lack of Transportation (Non-Medical): Not on file  Physical Activity:   . Days of Exercise per Week: Not on file  . Minutes of Exercise per Session: Not on file  Stress:   . Feeling of Stress : Not on file  Social Connections:   . Frequency of Communication with Friends and Family: Not on file  . Frequency of Social Gatherings with Friends and Family: Not on file  . Attends Religious Services: Not on file  . Active Member of Clubs or Organizations: Not on file  . Attends Banker Meetings: Not on file  . Marital Status: Not on file  Intimate Partner Violence:   . Fear of Current or Ex-Partner: Not on file  . Emotionally Abused: Not on file  . Physically Abused: Not on file  . Sexually Abused: Not on file   Family History  Problem Relation Age of Onset  . Hypertension Mother   . Transient ischemic attack Mother   . Diabetes Brother        Type 1  .  Breast cancer Maternal Aunt   . Cancer Maternal Aunt    No current facility-administered medications on file prior to encounter.   Current Outpatient Medications on File Prior to Encounter  Medication Sig Dispense Refill  . Butalbital-APAP-Caffeine 50-325-40 MG capsule Take 1-2 capsules by mouth every 6 (six) hours as needed for headache. 30 capsule 3  . cyclobenzaprine (FLEXERIL) 10 MG tablet Take 1 tablet (10 mg total) by mouth 3 (three) times daily as needed for muscle spasms. 40 tablet 3  . escitalopram (LEXAPRO) 10 MG tablet Take 1 tablet (10 mg total) by mouth daily. 30 tablet 3  . famotidine (PEPCID) 20 MG tablet Take 1 tablet (20 mg total) by mouth 2 (two) times daily. 60 tablet 3  . ferrous sulfate 325 (65 FE) MG tablet Take 325 mg by mouth 3 (three) times daily.    . metoCLOPramide (REGLAN) 10 MG tablet Take 1 tablet (10 mg total) by mouth  3 (three) times daily before meals. 30 tablet 2  . Prenatal Vit-Fe Fumarate-FA (PRENATAL MULTIVITAMIN) TABS tablet Take 1 tablet by mouth daily at 12 noon.    . promethazine (PHENERGAN) 25 MG suppository Place 1 suppository (25 mg total) rectally every 6 (six) hours as needed for nausea or vomiting. 60 each 5  . zolpidem (AMBIEN) 5 MG tablet Take 1 tablet (5 mg total) by mouth at bedtime as needed for sleep. 5 tablet 0  . labetalol (NORMODYNE) 100 MG tablet Take 2 tablets (200 mg total) by mouth 2 (two) times daily. 60 tablet 1  . Magnesium 400 MG TABS Take 400 mg by mouth daily. (Patient not taking: Reported on 10/22/2019) 30 tablet 0   Allergies  Allergen Reactions  . Nitrous Oxide Anaphylaxis  . Peach Flavor Anaphylaxis  . Shellfish Allergy Anaphylaxis  . Adhesive [Tape] Hives, Itching and Other (See Comments)    blisters    I have reviewed patient's Past Medical Hx, Surgical Hx, Family Hx, Social Hx, medications and allergies.   Review of Systems  Constitutional: Negative.   Gastrointestinal: Positive for nausea and vomiting. Negative for abdominal pain, constipation and diarrhea.  Genitourinary: Negative.     OBJECTIVE Patient Vitals for the past 24 hrs:  BP Temp Pulse Resp SpO2 Weight  10/28/19 1027 -- -- -- -- -- 77.1 kg  10/28/19 1021 112/64 97.6 F (36.4 C) (!) 111 18 98 % --   Constitutional: Well-developed, well-nourished female in no acute distress.  Cardiovascular: normal rate & rhythm, no murmur Respiratory: normal rate and effort. Lung sounds clear throughout GI: Abd soft, non-tender, Pos BS x 4. No guarding or rebound tenderness MS: Extremities nontender, no edema, normal ROM Neurologic: Alert and oriented x 4.     LAB RESULTS Results for orders placed or performed during the hospital encounter of 10/28/19 (from the past 24 hour(s))  Urinalysis, Routine w reflex microscopic     Status: Abnormal   Collection Time: 10/28/19 11:42 AM  Result Value Ref Range    Color, Urine YELLOW YELLOW   APPearance CLOUDY (A) CLEAR   Specific Gravity, Urine 1.014 1.005 - 1.030   pH 8.0 5.0 - 8.0   Glucose, UA NEGATIVE NEGATIVE mg/dL   Hgb urine dipstick NEGATIVE NEGATIVE   Bilirubin Urine NEGATIVE NEGATIVE   Ketones, ur 20 (A) NEGATIVE mg/dL   Protein, ur 30 (A) NEGATIVE mg/dL   Nitrite NEGATIVE NEGATIVE   Leukocytes,Ua NEGATIVE NEGATIVE   RBC / HPF 0-5 0 - 5 RBC/hpf   WBC, UA 0-5 0 -  5 WBC/hpf   Bacteria, UA RARE (A) NONE SEEN   Squamous Epithelial / LPF 0-5 0 - 5   Mucus PRESENT    Amorphous Crystal PRESENT   Basic metabolic panel     Status: Abnormal   Collection Time: 10/28/19 11:42 AM  Result Value Ref Range   Sodium 137 135 - 145 mmol/L   Potassium 4.2 3.5 - 5.1 mmol/L   Chloride 106 98 - 111 mmol/L   CO2 21 (L) 22 - 32 mmol/L   Glucose, Bld 89 70 - 99 mg/dL   BUN <5 (L) 6 - 20 mg/dL   Creatinine, Ser 0.57 0.44 - 1.00 mg/dL   Calcium 8.8 (L) 8.9 - 10.3 mg/dL   GFR calc non Af Amer >60 >60 mL/min   GFR calc Af Amer >60 >60 mL/min   Anion gap 10 5 - 15    IMAGING No results found.  MAU COURSE Orders Placed This Encounter  Procedures  . Urinalysis, Routine w reflex microscopic  . Basic metabolic panel  . Discharge patient   Meds ordered this encounter  Medications  . lactated ringers bolus 1,000 mL  . famotidine (PEPCID) IVPB 20 mg premix  . ondansetron (ZOFRAN) 8 mg in sodium chloride 0.9 % 50 mL IVPB  . ondansetron (ZOFRAN) 8 MG tablet    Sig: Take 1 tablet (8 mg total) by mouth every 8 (eight) hours as needed for nausea or vomiting.    Dispense:  20 tablet    Refill:  0    Order Specific Question:   Supervising Provider    Answer:   Arlina Robes L [[id number]]    MDM FHT present via doppler  IV LR bolus, zofran, & pepcid given Pt reports feeling better. Offered additional IV fluids or antiemetic, declines at this time. Needs refill for zofran.   ASSESSMENT 1. Hyperemesis gravidarum before end of [redacted] week gestation with  dehydration   2. Hyperemesis gravidarum   3. [redacted] weeks gestation of pregnancy     PLAN Discharge home in stable condition. Discussed reasons to return to MAU Rx zofran Continue antiemetic regimen at home  Follow-up [facility] Follow up.   Specialty: Obstetrics and Gynecology Why: return for worsening symptoms Contact information: [address] [id number] [place] [phone]         Allergies as of 10/28/2019      Reactions   Nitrous Oxide Anaphylaxis   Peach Flavor Anaphylaxis   Shellfish Allergy Anaphylaxis   Adhesive [tape] Hives, Itching, Other (See Comments)   blisters      Medication List    TAKE these medications   Butalbital-APAP-Caffeine 50-325-40 MG capsule Take 1-2 capsules by mouth every 6 (six) hours as needed for headache.   cyclobenzaprine 10 MG tablet Commonly known as: FLEXERIL Take 1 tablet (10 mg total) by mouth 3 (three) times daily as needed for muscle spasms.   escitalopram 10 MG tablet Commonly known as: Lexapro Take 1 tablet (10 mg total) by mouth daily.   famotidine 20 MG tablet Commonly known as: PEPCID Take 1 tablet (20 mg total) by mouth 2 (two) times daily.   ferrous sulfate 325 (65 FE) MG tablet Take 325 mg by mouth 3 (three) times daily.   labetalol 100 MG tablet Commonly known as: NORMODYNE Take 2 tablets (200 mg total) by mouth 2 (two) times daily.   Magnesium 400 MG Tabs Take 400 mg by mouth daily.  metoCLOPramide 10 MG tablet Commonly known as: REGLAN Take 1 tablet (10 mg total) by mouth 3 (three) times daily before meals.   ondansetron 8 MG tablet Commonly known as: ZOFRAN Take 1 tablet (8 mg total) by mouth every 8 (eight) hours as needed for nausea or vomiting.   prenatal multivitamin Tabs tablet Take 1 tablet by mouth daily at 12 noon.   promethazine 25 MG suppository Commonly known as: Phenergan Place 1 suppository (25 mg total)  rectally every 6 (six) hours as needed for nausea or vomiting.   zolpidem 5 MG tablet Commonly known as: AMBIEN Take 1 tablet (5 mg total) by mouth at bedtime as needed for sleep.        Judeth Horn, NP 10/28/2019  1:59 PM

## 2019-10-29 ENCOUNTER — Ambulatory Visit (HOSPITAL_COMMUNITY)
Admission: RE | Admit: 2019-10-29 | Discharge: 2019-10-29 | Disposition: A | Discharge status: Home / Self Care | Payer: 59 | Source: Ambulatory Visit | Attending: Family Medicine | Admitting: Family Medicine

## 2019-10-29 DIAGNOSIS — Z3A00 Weeks of gestation of pregnancy not specified: Secondary | ICD-10-CM | POA: Diagnosis not present

## 2019-10-29 DIAGNOSIS — O210 Mild hyperemesis gravidarum: Secondary | ICD-10-CM | POA: Diagnosis present

## 2019-10-29 MED ORDER — PROMETHAZINE HCL 25 MG/ML IJ SOLN
25.0000 mg | INTRAVENOUS | Status: DC
  Administered 2019-10-29: 25 mg via INTRAVENOUS
  Filled 2019-10-29: qty 1

## 2019-10-30 ENCOUNTER — Other Ambulatory Visit: Payer: Self-pay

## 2019-10-30 ENCOUNTER — Other Ambulatory Visit (HOSPITAL_COMMUNITY): Payer: Self-pay | Admitting: *Deleted

## 2019-10-30 ENCOUNTER — Ambulatory Visit (HOSPITAL_COMMUNITY)
Admission: RE | Admit: 2019-10-30 | Discharge: 2019-10-30 | Disposition: A | Discharge status: Home / Self Care | Payer: 59 | Source: Ambulatory Visit | Attending: Family Medicine | Admitting: Family Medicine

## 2019-10-30 DIAGNOSIS — O210 Mild hyperemesis gravidarum: Secondary | ICD-10-CM | POA: Diagnosis present

## 2019-10-30 MED ORDER — PROMETHAZINE HCL 25 MG/ML IJ SOLN
25.0000 mg | INTRAVENOUS | Status: DC
  Administered 2019-10-30: 25 mg via INTRAVENOUS
  Filled 2019-10-30: qty 1

## 2019-11-03 ENCOUNTER — Other Ambulatory Visit: Payer: Self-pay

## 2019-11-03 ENCOUNTER — Encounter (HOSPITAL_COMMUNITY)
Admission: RE | Admit: 2019-11-03 | Discharge: 2019-11-03 | Disposition: A | Discharge status: Home / Self Care | Payer: 59 | Source: Ambulatory Visit | Attending: Family Medicine | Admitting: Family Medicine

## 2019-11-03 DIAGNOSIS — O210 Mild hyperemesis gravidarum: Secondary | ICD-10-CM | POA: Diagnosis not present

## 2019-11-03 MED ORDER — PROMETHAZINE HCL 25 MG/ML IJ SOLN
25.0000 mg | INTRAVENOUS | Status: DC
  Administered 2019-11-03: 25 mg via INTRAVENOUS
  Filled 2019-11-03: qty 1

## 2019-11-04 ENCOUNTER — Other Ambulatory Visit: Payer: Self-pay | Admitting: Obstetrics & Gynecology

## 2019-11-04 ENCOUNTER — Telehealth: Payer: Self-pay

## 2019-11-04 MED ORDER — ZOLPIDEM TARTRATE 5 MG PO TABS: 5.0000 mg | ORAL_TABLET | Freq: Every evening | ORAL | 1 refills | Status: DC | PRN

## 2019-11-04 NOTE — Telephone Encounter (Signed)
Called pt. Pt made aware that a Rx for Ambien 10 mg was sent to her pharmacy. Pt also made aware that this medication is for PRN use only and it is not a medication that we routinely use in pregnancy a for limited use. Pt advised to review online SLEEP HYGIENE options. Understanding was voiced. Vandy Fong l Carles Florea, CMA

## 2019-11-04 NOTE — Telephone Encounter (Signed)
-----   Message from Lavonia Drafts, MD sent at 11/04/2019 10:56 AM EST ----- Regarding: RE: Rx request Please call pt. The rx was sent to the pharmacy. This is for prn use only. This not a med that we routinely use in pregnancy and recommend only limited use.   Please ask pt to review online SLEEP HYGIENE options as well.   Thx,  Clh-S ----- Message ----- From: Valentina Lucks, CMA Sent: 11/04/2019  10:42 AM EST To: Lavonia Drafts, MD Subject: Rx request                                     Pt is requesting a Rx for Ambien. States Benadryl, Tylenol PM, and Unisom does not work for her.

## 2019-11-04 NOTE — Telephone Encounter (Signed)
Pt called the office requesting a refill of Ambien 10 mg. Pt states PCP will not refill the medication. Message was sent to the provider.  Michelle Vincent l Michelle Vincent, CMA

## 2019-11-05 ENCOUNTER — Other Ambulatory Visit: Payer: Self-pay

## 2019-11-05 ENCOUNTER — Encounter (HOSPITAL_COMMUNITY)
Admission: RE | Admit: 2019-11-05 | Discharge: 2019-11-05 | Disposition: A | Discharge status: Home / Self Care | Payer: 59 | Source: Ambulatory Visit | Attending: Family Medicine | Admitting: Family Medicine

## 2019-11-05 DIAGNOSIS — R000 Tachycardia, unspecified: Secondary | ICD-10-CM

## 2019-11-05 DIAGNOSIS — O210 Mild hyperemesis gravidarum: Secondary | ICD-10-CM | POA: Diagnosis not present

## 2019-11-05 MED ORDER — LABETALOL HCL 100 MG PO TABS: 200.0000 mg | ORAL_TABLET | Freq: Two times a day (BID) | ORAL | 3 refills | Status: DC

## 2019-11-05 MED ORDER — PROMETHAZINE HCL 25 MG/ML IJ SOLN
25.0000 mg | INTRAVENOUS | Status: DC
  Administered 2019-11-05: 25 mg via INTRAVENOUS
  Filled 2019-11-05: qty 1

## 2019-11-06 ENCOUNTER — Inpatient Hospital Stay (HOSPITAL_COMMUNITY): Admission: RE | Admit: 2019-11-06 | Payer: 59 | Source: Ambulatory Visit

## 2019-11-08 ENCOUNTER — Inpatient Hospital Stay (HOSPITAL_COMMUNITY)
Admission: AD | Admit: 2019-11-08 | Discharge: 2019-11-08 | Disposition: A | Discharge status: Home / Self Care | Payer: 59 | Attending: Obstetrics & Gynecology | Admitting: Obstetrics & Gynecology

## 2019-11-08 ENCOUNTER — Encounter (HOSPITAL_COMMUNITY): Payer: Self-pay | Admitting: Obstetrics & Gynecology

## 2019-11-08 ENCOUNTER — Other Ambulatory Visit: Payer: Self-pay

## 2019-11-08 DIAGNOSIS — J45909 Unspecified asthma, uncomplicated: Secondary | ICD-10-CM | POA: Insufficient documentation

## 2019-11-08 DIAGNOSIS — O26892 Other specified pregnancy related conditions, second trimester: Secondary | ICD-10-CM

## 2019-11-08 DIAGNOSIS — Z888 Allergy status to other drugs, medicaments and biological substances status: Secondary | ICD-10-CM | POA: Diagnosis not present

## 2019-11-08 DIAGNOSIS — Z8249 Family history of ischemic heart disease and other diseases of the circulatory system: Secondary | ICD-10-CM | POA: Diagnosis not present

## 2019-11-08 DIAGNOSIS — Z833 Family history of diabetes mellitus: Secondary | ICD-10-CM | POA: Insufficient documentation

## 2019-11-08 DIAGNOSIS — Z79899 Other long term (current) drug therapy: Secondary | ICD-10-CM | POA: Insufficient documentation

## 2019-11-08 DIAGNOSIS — N949 Unspecified condition associated with female genital organs and menstrual cycle: Secondary | ICD-10-CM | POA: Diagnosis not present

## 2019-11-08 DIAGNOSIS — I471 Supraventricular tachycardia: Secondary | ICD-10-CM | POA: Diagnosis not present

## 2019-11-08 DIAGNOSIS — Z823 Family history of stroke: Secondary | ICD-10-CM | POA: Diagnosis not present

## 2019-11-08 DIAGNOSIS — O99512 Diseases of the respiratory system complicating pregnancy, second trimester: Secondary | ICD-10-CM | POA: Diagnosis not present

## 2019-11-08 DIAGNOSIS — R102 Pelvic and perineal pain: Secondary | ICD-10-CM | POA: Insufficient documentation

## 2019-11-08 DIAGNOSIS — F419 Anxiety disorder, unspecified: Secondary | ICD-10-CM | POA: Diagnosis not present

## 2019-11-08 DIAGNOSIS — Z91013 Allergy to seafood: Secondary | ICD-10-CM | POA: Diagnosis not present

## 2019-11-08 DIAGNOSIS — O99891 Other specified diseases and conditions complicating pregnancy: Secondary | ICD-10-CM | POA: Diagnosis present

## 2019-11-08 DIAGNOSIS — Z3A22 22 weeks gestation of pregnancy: Secondary | ICD-10-CM

## 2019-11-08 DIAGNOSIS — O99342 Other mental disorders complicating pregnancy, second trimester: Secondary | ICD-10-CM | POA: Insufficient documentation

## 2019-11-08 DIAGNOSIS — O99412 Diseases of the circulatory system complicating pregnancy, second trimester: Secondary | ICD-10-CM | POA: Diagnosis not present

## 2019-11-08 DIAGNOSIS — Z91018 Allergy to other foods: Secondary | ICD-10-CM | POA: Insufficient documentation

## 2019-11-08 DIAGNOSIS — Z803 Family history of malignant neoplasm of breast: Secondary | ICD-10-CM | POA: Insufficient documentation

## 2019-11-08 LAB — URINALYSIS, ROUTINE W REFLEX MICROSCOPIC
Bilirubin Urine: NEGATIVE
Glucose, UA: NEGATIVE mg/dL
Hgb urine dipstick: NEGATIVE
Ketones, ur: NEGATIVE mg/dL
Leukocytes,Ua: NEGATIVE
Nitrite: NEGATIVE
Protein, ur: NEGATIVE mg/dL
Specific Gravity, Urine: 1.012 (ref 1.005–1.030)
pH: 6 (ref 5.0–8.0)

## 2019-11-08 LAB — WET PREP, GENITAL
Clue Cells Wet Prep HPF POC: NONE SEEN
Sperm: NONE SEEN
Trich, Wet Prep: NONE SEEN
Yeast Wet Prep HPF POC: NONE SEEN

## 2019-11-08 MED ORDER — ACETAMINOPHEN 500 MG PO TABS
1000.0000 mg | ORAL_TABLET | Freq: Once | ORAL | Status: AC
  Administered 2019-11-08: 1000 mg via ORAL
  Filled 2019-11-08: qty 2

## 2019-11-08 MED ORDER — COMFORT FIT MATERNITY SUPP SM MISC: 1.0000 [IU] | Freq: Every day | 0 refills | Status: DC | PRN

## 2019-11-08 MED ORDER — CYCLOBENZAPRINE HCL 10 MG PO TABS
10.0000 mg | ORAL_TABLET | Freq: Once | ORAL | Status: AC
  Administered 2019-11-08: 10 mg via ORAL
  Filled 2019-11-08: qty 1

## 2019-11-08 NOTE — Discharge Instructions (Signed)
Abdominal Pain During Pregnancy  Abdominal pain is common during pregnancy, and has many possible causes. Some causes are more serious than others, and sometimes the cause is not known. Abdominal pain can be a sign that labor is starting. It can also be caused by normal growth and stretching of muscles and ligaments during pregnancy. Always tell your health care provider if you have any abdominal pain. Follow these instructions at home:  Do not have sex or put anything in your vagina until your pain goes away completely.  Get plenty of rest until your pain improves.  Drink enough fluid to keep your urine pale yellow.  Take over-the-counter and prescription medicines only as told by your health care provider.  Keep all follow-up visits as told by your health care provider. This is important. Contact a health care provider if:  Your pain continues or gets worse after resting.  You have lower abdominal pain that: ? Comes and goes at regular intervals. ? Spreads to your back. ? Is similar to menstrual cramps.  You have pain or burning when you urinate. Get help right away if:  You have a fever or chills.  You have vaginal bleeding.  You are leaking fluid from your vagina.  You are passing tissue from your vagina.  You have vomiting or diarrhea that lasts for more than 24 hours.  Your baby is moving less than usual.  You feel very weak or faint.  You have shortness of breath.  You develop severe pain in your upper abdomen. Summary  Abdominal pain is common during pregnancy, and has many possible causes.  If you experience abdominal pain during pregnancy, tell your health care provider right away.  Follow your health care provider's home care instructions and keep all follow-up visits as directed. This information is not intended to replace advice given to you by your health care provider. Make sure you discuss any questions you have with your health care  provider. Document Revised: 02/10/2019 Document Reviewed: 01/25/2017 Elsevier Patient Education  2020 [employer].  Preterm Labor and Birth Information  The normal length of a pregnancy is 39-41 weeks. Preterm labor is when labor starts before 37 completed weeks of pregnancy. What are the risk factors for preterm labor? Preterm labor is more likely to occur in women who:  Have certain infections during pregnancy such as a bladder infection, sexually transmitted infection, or infection inside the uterus (chorioamnionitis).  Have a shorter-than-normal cervix.  Have gone into preterm labor before.  Have had surgery on their cervix.  Are younger than age 62 or older than age 29.  Are African American.  Are pregnant with twins or multiple babies (multiple gestation).  Take street drugs or smoke while pregnant.  Do not gain enough weight while pregnant.  Became pregnant shortly after having been pregnant. What are the symptoms of preterm labor? Symptoms of preterm labor include:  Cramps similar to those that can happen during a menstrual period. The cramps may happen with diarrhea.  Pain in the abdomen or lower back.  Regular uterine contractions that may feel like tightening of the abdomen.  A feeling of increased pressure in the pelvis.  Increased watery or bloody mucus discharge from the vagina.  Water breaking (ruptured amniotic sac). Why is it important to recognize signs of preterm labor? It is important to recognize signs of preterm labor because babies who are born prematurely may not be fully developed. This can put them at an increased risk for:  Long-term (chronic) heart and lung problems.  Difficulty immediately after birth with regulating body systems, including blood sugar, body temperature, heart rate, and breathing rate.  Bleeding in the brain.  Cerebral palsy.  Learning difficulties.  Death. These risks are highest for babies who are born before  34 weeks of pregnancy. How is preterm labor treated? Treatment depends on the length of your pregnancy, your condition, and the health of your baby. It may involve:  Having a stitch (suture) placed in your cervix to prevent your cervix from opening too early (cerclage).  Taking or being given medicines, such as: ? Hormone medicines. These may be given early in pregnancy to help support the pregnancy. ? Medicine to stop contractions. ? Medicines to help mature the baby's lungs. These may be prescribed if the risk of delivery is high. ? Medicines to prevent your baby from developing cerebral palsy. If the labor happens before 34 weeks of pregnancy, you may need to stay in the hospital. What should I do if I think I am in preterm labor? If you think that you are going into preterm labor, call your health care provider right away. How can I prevent preterm labor in future pregnancies? To increase your chance of having a full-term pregnancy:  Do not use any tobacco products, such as cigarettes, chewing tobacco, and e-cigarettes. If you need help quitting, ask your health care provider.  Do not use street drugs or medicines that have not been prescribed to you during your pregnancy.  Talk with your health care provider before taking any herbal supplements, even if you have been taking them regularly.  Make sure you gain a healthy amount of weight during your pregnancy.  Watch for infection. If you think that you might have an infection, get it checked right away.  Make sure to tell your health care provider if you have gone into preterm labor before. This information is not intended to replace advice given to you by your health care provider. Make sure you discuss any questions you have with your health care provider. Document Revised: 02/14/2019 Document Reviewed: 03/15/2016 Elsevier Patient Education  2020 [employer].  Second Trimester of Pregnancy The second trimester is from week 14  through week 27 (months 4 through 6). The second trimester is often a time when you feel your best. Your body has adjusted to being pregnant, and you begin to feel better physically. Usually, morning sickness has lessened or quit completely, you may have more energy, and you may have an increase in appetite. The second trimester is also a time when the fetus is growing rapidly. At the end of the sixth month, the fetus is about 9 inches long and weighs about 1 pounds. You will likely begin to feel the baby move (quickening) between 16 and 20 weeks of pregnancy. Body changes during your second trimester Your body continues to go through many changes during your second trimester. The changes vary from woman to woman.  Your weight will continue to increase. You will notice your lower abdomen bulging out.  You may begin to get stretch marks on your hips, abdomen, and breasts.  You may develop headaches that can be relieved by medicines. The medicines should be approved by your health care provider.  You may urinate more often because the fetus is pressing on your bladder.  You may develop or continue to have heartburn as a result of your pregnancy.  You may develop constipation because certain hormones are causing the muscles  that push waste through your intestines to slow down.  You may develop hemorrhoids or swollen, bulging veins (varicose veins).  You may have back pain. This is caused by: ? Weight gain. ? Pregnancy hormones that are relaxing the joints in your pelvis. ? A shift in weight and the muscles that support your balance.  Your breasts will continue to grow and they will continue to become tender.  Your gums may bleed and may be sensitive to brushing and flossing.  Dark spots or blotches (chloasma, mask of pregnancy) may develop on your face. This will likely fade after the baby is born.  A dark line from your belly button to the pubic area (linea nigra) may appear. This will  likely fade after the baby is born.  You may have changes in your hair. These can include thickening of your hair, rapid growth, and changes in texture. Some women also have hair loss during or after pregnancy, or hair that feels dry or thin. Your hair will most likely return to normal after your baby is born. What to expect at prenatal visits During a routine prenatal visit:  You will be weighed to make sure you and the fetus are growing normally.  Your blood pressure will be taken.  Your abdomen will be measured to track your baby's growth.  The fetal heartbeat will be listened to.  Any test results from the previous visit will be discussed. Your health care provider may ask you:  How you are feeling.  If you are feeling the baby move.  If you have had any abnormal symptoms, such as leaking fluid, bleeding, severe headaches, or abdominal cramping.  If you are using any tobacco products, including cigarettes, chewing tobacco, and electronic cigarettes.  If you have any questions. Other tests that may be performed during your second trimester include:  Blood tests that check for: ? Low iron levels (anemia). ? High blood sugar that affects pregnant women (gestational diabetes) between 4924 and 28 weeks. ? Rh antibodies. This is to check for a protein on red blood cells (Rh factor).  Urine tests to check for infections, diabetes, or protein in the urine.  An ultrasound to confirm the proper growth and development of the baby.  An amniocentesis to check for possible genetic problems.  Fetal screens for spina bifida and Down syndrome.  HIV (human immunodeficiency virus) testing. Routine prenatal testing includes screening for HIV, unless you choose not to have this test. Follow these instructions at home: Medicines  Follow your health care provider's instructions regarding medicine use. Specific medicines may be either safe or unsafe to take during pregnancy.  Take a prenatal  vitamin that contains at least 600 micrograms (mcg) of folic acid.  If you develop constipation, try taking a stool softener if your health care provider approves. Eating and drinking   Eat a balanced diet that includes fresh fruits and vegetables, whole grains, good sources of protein such as meat, eggs, or tofu, and low-fat dairy. Your health care provider will help you determine the amount of weight gain that is right for you.  Avoid raw meat and uncooked cheese. These carry germs that can cause birth defects in the baby.  If you have low calcium intake from food, talk to your health care provider about whether you should take a daily calcium supplement.  Limit foods that are high in fat and processed sugars, such as fried and sweet foods.  To prevent constipation: ? Drink enough fluid to keep  your urine clear or pale yellow. ? Eat foods that are high in fiber, such as fresh fruits and vegetables, whole grains, and beans. Activity  Exercise only as directed by your health care provider. Most women can continue their usual exercise routine during pregnancy. Try to exercise for 30 minutes at least 5 days a week. Stop exercising if you experience uterine contractions.  Avoid heavy lifting, wear low heel shoes, and practice good posture.  A sexual relationship may be continued unless your health care provider directs you otherwise. Relieving pain and discomfort  Wear a good support bra to prevent discomfort from breast tenderness.  Take warm sitz baths to soothe any pain or discomfort caused by hemorrhoids. Use hemorrhoid cream if your health care provider approves.  Rest with your legs elevated if you have leg cramps or low back pain.  If you develop varicose veins, wear support hose. Elevate your feet for 15 minutes, 3-4 times a day. Limit salt in your diet. Prenatal Care  Write down your questions. Take them to your prenatal visits.  Keep all your prenatal visits as told by  your health care provider. This is important. Safety  Wear your seat belt at all times when driving.  Make a list of emergency phone numbers, including numbers for family, friends, the hospital, and police and fire departments. General instructions  Ask your health care provider for a referral to a local prenatal education class. Begin classes no later than the beginning of month 6 of your pregnancy.  Ask for help if you have counseling or nutritional needs during pregnancy. Your health care provider can offer advice or refer you to specialists for help with various needs.  Do not use hot tubs, steam rooms, or saunas.  Do not douche or use tampons or scented sanitary pads.  Do not cross your legs for long periods of time.  Avoid cat litter boxes and soil used by cats. These carry germs that can cause birth defects in the baby and possibly loss of the fetus by miscarriage or stillbirth.  Avoid all smoking, herbs, alcohol, and unprescribed drugs. Chemicals in these products can affect the formation and growth of the baby.  Do not use any products that contain nicotine or tobacco, such as cigarettes and e-cigarettes. If you need help quitting, ask your health care provider.  Visit your dentist if you have not gone yet during your pregnancy. Use a soft toothbrush to brush your teeth and be gentle when you floss. Contact a health care provider if:  You have dizziness.  You have mild pelvic cramps, pelvic pressure, or nagging pain in the abdominal area.  You have persistent nausea, vomiting, or diarrhea.  You have a bad smelling vaginal discharge.  You have pain when you urinate. Get help right away if:  You have a fever.  You are leaking fluid from your vagina.  You have spotting or bleeding from your vagina.  You have severe abdominal cramping or pain.  You have rapid weight gain or weight loss.  You have shortness of breath with chest pain.  You notice sudden or extreme  swelling of your face, hands, ankles, feet, or legs.  You have not felt your baby move in over an hour.  You have severe headaches that do not go away when you take medicine.  You have vision changes. Summary  The second trimester is from week 14 through week 27 (months 4 through 6). It is also a time when the fetus  is growing rapidly.  Your body goes through many changes during pregnancy. The changes vary from woman to woman.  Avoid all smoking, herbs, alcohol, and unprescribed drugs. These chemicals affect the formation and growth your baby.  Do not use any tobacco products, such as cigarettes, chewing tobacco, and e-cigarettes. If you need help quitting, ask your health care provider.  Contact your health care provider if you have any questions. Keep all prenatal visits as told by your health care provider. This is important. This information is not intended to replace advice given to you by your health care provider. Make sure you discuss any questions you have with your health care provider. Document Revised: 02/14/2019 Document Reviewed: 11/28/2016 Elsevier Patient Education  2020 [employer] Round Ligament Pain  The round ligament is a cord of muscle and tissue that helps support the uterus. It can become a source of pain during pregnancy if it becomes stretched or twisted as the baby grows. The pain usually begins in the second trimester (13-28 weeks) of pregnancy, and it can come and go until the baby is delivered. It is not a serious problem, and it does not cause harm to the baby. Round ligament pain is usually a short, sharp, and pinching pain, but it can also be a dull, lingering, and aching pain. The pain is felt in the lower side of the abdomen or in the groin. It usually starts deep in the groin and moves up to the outside of the hip area. The pain may occur when you:  Suddenly change position, such as quickly going from a sitting to standing position.  Roll over in  bed.  Cough or sneeze.  Do physical activity. Follow these instructions at home:   Watch your condition for any changes.  When the pain starts, relax. Then try any of these methods to help with the pain: ? Sitting down. ? Flexing your knees up to your abdomen. ? Lying on your side with one pillow under your abdomen and another pillow between your legs. ? Sitting in a warm bath for 15-20 minutes or until the pain goes away.  Take over-the-counter and prescription medicines only as told by your health care provider.  Move slowly when you sit down or stand up.  Avoid long walks if they cause pain.  Stop or reduce your physical activities if they cause pain.  Keep all follow-up visits as told by your health care provider. This is important. Contact a health care provider if:  Your pain does not go away with treatment.  You feel pain in your back that you did not have before.  Your medicine is not helping. Get help right away if:  You have a fever or chills.  You develop uterine contractions.  You have vaginal bleeding.  You have nausea or vomiting.  You have diarrhea.  You have pain when you urinate. Summary  Round ligament pain is felt in the lower abdomen or groin. It is usually a short, sharp, and pinching pain. It can also be a dull, lingering, and aching pain.  This pain usually begins in the second trimester (13-28 weeks). It occurs because the uterus is stretching with the growing baby, and it is not harmful to the baby.  You may notice the pain when you suddenly change position, when you cough or sneeze, or during physical activity.  Relaxing, flexing your knees to your abdomen, lying on one side, or taking a warm bath may help to get rid  of the pain.  Get help from your health care provider if the pain does not go away or if you have vaginal bleeding, nausea, vomiting, diarrhea, or painful urination. This information is not intended to replace advice given  to you by your health care provider. Make sure you discuss any questions you have with your health care provider. Document Revised: 04/10/2018 Document Reviewed: 04/10/2018 Elsevier Patient Education  [place]: You are not alone, Seventy-five percent of women have some sort of abdominal or back pain at some point in their pregnancy. Your baby is growing at a fast pace, which means that your whole body is rapidly trying to adjust to the changes. As your uterus grows, your back may start feeling a bit under stress and this can result in back or abdominal pain that can go from mild, and therefore bearable, to severe pains that will not allow you to sit or lay down comfortably, When it comes to dealing with pregnancy-related pains and cramps, some pregnant women usually prefer natural remedies, which the market is filled with nowadays. For example, wearing a pregnancy support belt can help ease and lessen your discomfort and pain. WHAT ARE THE BENEFITS OF WEARING A PREGNANCY SUPPORT BELT? A pregnancy support belt provides support to the lower portion of the belly taking some of the weight of the growing uterus and distributing to the other parts of your body. It is designed make you comfortable and gives you extra support. Over the years, the pregnancy apparel market has been studying the needs and wants of pregnant women and they have come up with the most comfortable pregnancy support belts that woman could ever ask for. In fact, you will no longer have to wear a stretched-out or bulky pregnancy belt that is visible underneath your clothes and makes you feel even more uncomfortable. Nowadays, a pregnancy support belt is made of comfortable and stretchy materials that will not irritate your skin but will actually make you feel at ease and you will not even notice you are wearing it. They are easy to put on and adjust during the day and can be worn at night for additional  support.  BENEFITS: . Relives Back pain . Relieves Abdominal Muscle and Leg Pain . Stabilizes the Pelvic Ring . Offers a Cushioned Abdominal Lift Pad . Relieves pressure on the Sciatic Nerve Within Minutes WHERE TO GET YOUR PREGNANCY BELT: [employer] [phone] [phone]  [place], [place] [address]

## 2019-11-08 NOTE — MAU Note (Signed)
Pt reports she thinks she had contractions on Friday night. Started having lower abdominal pain tonight. Pain is sharp and constant. Pt denies vaginal bleeding or discharge. Had a small amount of mucousy discharge a few days ago. Took a Flexeril yesterday, but did not help. Rates 5/10.

## 2019-11-08 NOTE — MAU Provider Note (Signed)
History     CSN: [id number]  Arrival date and time: 11/08/19 2774   First Provider Initiated Contact with Patient 11/08/19 2044      Chief Complaint  Patient presents with  . Abdominal Pain   Ms. Michelle Vincent is a 29 y.o. G1P0000 at [redacted]w[redacted]d who presents to MAU for abdominal pain. Pt reports she has a "belly band" at home, but has not been given a prescription for a pregnancy support belt.  Onset: 11/06/2019 Location: LLQ Duration: 3 days Character: sharp, constant (originally intermittent), radiates into vagina and up along left side Aggravating/Associated: none/none Relieving: none Treatment: Flexeril, Tylenol (last took meds this AM), heating pad - did not work Severity: 8/10  Pt denies VB, LOF, vaginal discharge/odor/itching. Pt denies N/V, constipation, diarrhea, or urinary problems. Pt denies fever, chills, fatigue, sweating or changes in appetite. Pt denies SOB or chest pain. Pt denies dizziness, HA, light-headedness, weakness.  Problems this pregnancy include: hyperemesis. Allergies? Nitrous oxide, Reglan, fruit, shellfish, adhesive Current medications/supplements? Lexapro, zofran, phenergan Prenatal care provider? [facility], next appt 11/18/2018  Pt's boyfriend present for entire visit.   OB History    Gravida  1   Para  0   Term  0   Preterm  0   AB  0   Living  0     SAB  0   TAB  0   Ectopic  0   Multiple  0   Live Births  0           Past Medical History:  Diagnosis Date  . Anemia   . Anxiety    takes lexapro  . Asthma    prn inhaler use  . HA (headache)   . Migraine   . Occipital neuralgia   . Sinus tachycardia   . SVT (supraventricular tachycardia) (HCC)     Past Surgical History:  Procedure Laterality Date  . shoulder Right   . TONSILLECTOMY AND ADENOIDECTOMY    . tubes in ears      Family History  Problem Relation Age of Onset  . Hypertension Mother   . Transient ischemic attack Mother   . Diabetes Brother         Type 1  . Breast cancer Maternal Aunt   . Cancer Maternal Aunt     Social History   Tobacco Use  . Smoking status: Never Smoker  . Smokeless tobacco: Never Used  Substance Use Topics  . Alcohol use: Not Currently  . Drug use: Never    Allergies:  Allergies  Allergen Reactions  . Nitrous Oxide Anaphylaxis  . Peach Flavor Anaphylaxis  . Shellfish Allergy Anaphylaxis  . Adhesive [Tape] Hives, Itching and Other (See Comments)    blisters    Medications Prior to Admission  Medication Sig Dispense Refill Last Dose  . Butalbital-APAP-Caffeine 50-325-40 MG capsule Take 1-2 capsules by mouth every 6 (six) hours as needed for headache. 30 capsule 3 11/07/2019 at Unknown time  . cyclobenzaprine (FLEXERIL) 10 MG tablet Take 1 tablet (10 mg total) by mouth 3 (three) times daily as needed for muscle spasms. 40 tablet 3 11/07/2019 at Unknown time  . escitalopram (LEXAPRO) 10 MG tablet Take 1 tablet (10 mg total) by mouth daily. 30 tablet 3 11/07/2019 at Unknown time  . famotidine (PEPCID) 20 MG tablet Take 1 tablet (20 mg total) by mouth 2 (two) times daily. 60 tablet 3 Past Week at Unknown time  . ferrous sulfate 325 (65 FE) MG tablet Take 325  mg by mouth 3 (three) times daily.   11/07/2019 at Unknown time  . labetalol (NORMODYNE) 100 MG tablet Take 2 tablets (200 mg total) by mouth 2 (two) times daily. 60 tablet 3 11/08/2019 at Unknown time  . ondansetron (ZOFRAN) 8 MG tablet Take 1 tablet (8 mg total) by mouth every 8 (eight) hours as needed for nausea or vomiting. 20 tablet 0 11/08/2019 at Unknown time  . Prenatal Vit-Fe Fumarate-FA (PRENATAL MULTIVITAMIN) TABS tablet Take 1 tablet by mouth daily at 12 noon.   11/07/2019 at Unknown time  . promethazine (PHENERGAN) 25 MG suppository Place 1 suppository (25 mg total) rectally every 6 (six) hours as needed for nausea or vomiting. 60 each 5 Past Week at Unknown time  . zolpidem (AMBIEN) 5 MG tablet Take 1 tablet (5 mg total) by mouth at bedtime as needed  for sleep. 10 tablet 1 Past Week at Unknown time  . Magnesium 400 MG TABS Take 400 mg by mouth daily. (Patient not taking: Reported on 10/22/2019) 30 tablet 0   . metoCLOPramide (REGLAN) 10 MG tablet Take 1 tablet (10 mg total) by mouth 3 (three) times daily before meals. 30 tablet 2     Review of Systems  Constitutional: Negative for chills, diaphoresis, fatigue and fever.  Eyes: Negative for visual disturbance.  Respiratory: Negative for shortness of breath.   Cardiovascular: Negative for chest pain.  Gastrointestinal: Positive for abdominal pain. Negative for constipation, diarrhea, nausea and vomiting.  Genitourinary: Negative for dysuria, flank pain, frequency, pelvic pain, urgency, vaginal bleeding and vaginal discharge.  Neurological: Negative for dizziness, weakness, light-headedness and headaches.   Physical Exam   Blood pressure 117/62, pulse 84, temperature 98.1 F (36.7 C), temperature source Oral, resp. rate 16, height 5\' 7"  (1.702 m), weight 80.8 kg, last menstrual period 06/01/2019, SpO2 100 %.  Patient Vitals for the past 24 hrs:  BP Temp Temp src Pulse Resp SpO2 Height Weight  11/08/19 1916 117/62 98.1 F (36.7 C) Oral 84 16 100 % 5\' 7"  (1.702 m) 80.8 kg   Physical Exam  Constitutional: She is oriented to person, place, and time. She appears well-developed and well-nourished. No distress.  HENT:  Head: Normocephalic and atraumatic.  Respiratory: Effort normal.  GI: Soft. She exhibits no distension and no mass. There is no abdominal tenderness. There is no rebound and no guarding.  Genitourinary:    Genitourinary Comments: CE: long/closed/posterior   Neurological: She is alert and oriented to person, place, and time.  Skin: Skin is warm and dry. She is not diaphoretic.  Psychiatric: She has a normal mood and affect. Her behavior is normal. Judgment and thought content normal.   Results for orders placed or performed during the hospital encounter of 11/08/19 (from  the past 24 hour(s))  Urinalysis, Routine w reflex microscopic     Status: Abnormal   Collection Time: 11/08/19  7:46 PM  Result Value Ref Range   Color, Urine YELLOW YELLOW   APPearance HAZY (A) CLEAR   Specific Gravity, Urine 1.012 1.005 - 1.030   pH 6.0 5.0 - 8.0   Glucose, UA NEGATIVE NEGATIVE mg/dL   Hgb urine dipstick NEGATIVE NEGATIVE   Bilirubin Urine NEGATIVE NEGATIVE   Ketones, ur NEGATIVE NEGATIVE mg/dL   Protein, ur NEGATIVE NEGATIVE mg/dL   Nitrite NEGATIVE NEGATIVE   Leukocytes,Ua NEGATIVE NEGATIVE  Wet prep, genital     Status: Abnormal   Collection Time: 11/08/19  9:18 PM  Result Value Ref Range   Yeast Wet  Prep HPF POC NONE SEEN NONE SEEN   Trich, Wet Prep NONE SEEN NONE SEEN   Clue Cells Wet Prep HPF POC NONE SEEN NONE SEEN   WBC, Wet Prep HPF POC MODERATE (A) NONE SEEN   Sperm NONE SEEN    MAU Course  Procedures  MDM -suspect RLP, r/o PTL -normal abdominal exam -UA: hazy, otherwise WNL, sending urine for culture based on symptoms -WetPrep: WNL -GC/CT collected -CE: long/closed/posterior -Flexeril 10mg  and Tylenol 1000mg  given, pt reports pain now 4/10 -pt discharged to home in stable condition  Orders Placed This Encounter  Procedures  . Culture, OB Urine    Standing Status:   Standing    Number of Occurrences:   1  . Wet prep, genital    Standing Status:   Standing    Number of Occurrences:   1  . Urinalysis, Routine w reflex microscopic    Standing Status:   Standing    Number of Occurrences:   1  . Discharge patient    Order Specific Question:   Discharge disposition    Answer:   01-Home or Self Care [1]    Order Specific Question:   Discharge patient date    Answer:   11/08/2019   Meds ordered this encounter  Medications  . Elastic Bandages & Supports (COMFORT FIT MATERNITY SUPP SM) MISC    Sig: 1 Units by Does not apply route daily as needed.    Dispense:  1 each    Refill:  0    Order Specific Question:   Supervising Provider     Answer:   [place] Mode [[id number]]  . cyclobenzaprine (FLEXERIL) tablet 10 mg  . acetaminophen (TYLENOL) tablet 1,000 mg   Assessment and Plan   1. Round ligament pain   2. [redacted] weeks gestation of pregnancy    Allergies as of 11/08/2019      Reactions   Nitrous Oxide Anaphylaxis   Peach Flavor Anaphylaxis   Shellfish Allergy Anaphylaxis   Adhesive [tape] Hives, Itching, Other (See Comments)   blisters      Medication List    TAKE these medications   Butalbital-APAP-Caffeine 50-325-40 MG capsule Take 1-2 capsules by mouth every 6 (six) hours as needed for headache.   Comfort Fit Maternity Supp Sm Misc 1 Units by Does not apply route daily as needed.   cyclobenzaprine 10 MG tablet Commonly known as: FLEXERIL Take 1 tablet (10 mg total) by mouth 3 (three) times daily as needed for muscle spasms.   escitalopram 10 MG tablet Commonly known as: Lexapro Take 1 tablet (10 mg total) by mouth daily.   famotidine 20 MG tablet Commonly known as: PEPCID Take 1 tablet (20 mg total) by mouth 2 (two) times daily.   ferrous sulfate 325 (65 FE) MG tablet Take 325 mg by mouth 3 (three) times daily.   labetalol 100 MG tablet Commonly known as: NORMODYNE Take 2 tablets (200 mg total) by mouth 2 (two) times daily.   Magnesium 400 MG Tabs Take 400 mg by mouth daily.   metoCLOPramide 10 MG tablet Commonly known as: REGLAN Take 1 tablet (10 mg total) by mouth 3 (three) times daily before meals.   ondansetron 8 MG tablet Commonly known as: ZOFRAN Take 1 tablet (8 mg total) by mouth every 8 (eight) hours as needed for nausea or vomiting.   prenatal multivitamin Tabs tablet Take 1 tablet by mouth daily at 12 noon.   promethazine 25 MG suppository Commonly known as:  Phenergan Place 1 suppository (25 mg total) rectally every 6 (six) hours as needed for nausea or vomiting.   zolpidem 5 MG tablet Commonly known as: AMBIEN Take 1 tablet (5 mg total) by mouth at bedtime as needed for  sleep.      -will call with culture results, if positive -RX pregnancy support belt with instructions -discussed using Tylenol, Flexeril around the clock, if able to accommodate with side effects, pt reports has Flexeril and Tylenol at home -discussed differences in pain/sensations between RLP and PTL -return MAU precautions given -pt discharged to home in stable condition  Michelle Vincent 11/08/2019, 10:24 PM

## 2019-11-10 ENCOUNTER — Ambulatory Visit (HOSPITAL_COMMUNITY)
Admission: RE | Admit: 2019-11-10 | Discharge: 2019-11-10 | Disposition: A | Discharge status: Home / Self Care | Payer: 59 | Source: Ambulatory Visit | Attending: Family Medicine | Admitting: Family Medicine

## 2019-11-10 ENCOUNTER — Other Ambulatory Visit: Payer: Self-pay

## 2019-11-10 DIAGNOSIS — O210 Mild hyperemesis gravidarum: Secondary | ICD-10-CM | POA: Insufficient documentation

## 2019-11-10 DIAGNOSIS — Z3A00 Weeks of gestation of pregnancy not specified: Secondary | ICD-10-CM | POA: Insufficient documentation

## 2019-11-10 LAB — CULTURE, OB URINE

## 2019-11-10 LAB — GC/CHLAMYDIA PROBE AMP (~~LOC~~) NOT AT ARMC
Chlamydia: NEGATIVE
Comment: NEGATIVE
Comment: NORMAL
Neisseria Gonorrhea: NEGATIVE

## 2019-11-10 MED ORDER — PROMETHAZINE HCL 25 MG/ML IJ SOLN
25.0000 mg | INTRAVENOUS | Status: DC
  Administered 2019-11-10: 25 mg via INTRAVENOUS
  Filled 2019-11-10: qty 1

## 2019-11-11 ENCOUNTER — Telehealth: Payer: Self-pay

## 2019-11-11 ENCOUNTER — Ambulatory Visit (HOSPITAL_COMMUNITY)
Admission: RE | Admit: 2019-11-11 | Discharge: 2019-11-11 | Disposition: A | Discharge status: Home / Self Care | Payer: 59 | Source: Ambulatory Visit | Attending: Family Medicine | Admitting: Family Medicine

## 2019-11-11 ENCOUNTER — Other Ambulatory Visit: Payer: Self-pay

## 2019-11-11 DIAGNOSIS — O210 Mild hyperemesis gravidarum: Secondary | ICD-10-CM | POA: Diagnosis present

## 2019-11-11 DIAGNOSIS — Z3A00 Weeks of gestation of pregnancy not specified: Secondary | ICD-10-CM | POA: Insufficient documentation

## 2019-11-11 MED ORDER — PROMETHAZINE HCL 25 MG/ML IJ SOLN
25.0000 mg | INTRAVENOUS | Status: DC
  Administered 2019-11-11: 25 mg via INTRAVENOUS
  Filled 2019-11-11: qty 1

## 2019-11-11 NOTE — Telephone Encounter (Signed)
Patient called stating she was vomiting this morning and yesterday all day. Patient states that she is under a new supervisor and just needs a return to work note for tomorrow. Patient plans to pick the note up. Armandina Stammer RN

## 2019-11-12 ENCOUNTER — Encounter (HOSPITAL_COMMUNITY): Payer: 59

## 2019-11-14 ENCOUNTER — Ambulatory Visit (HOSPITAL_COMMUNITY): Payer: 59 | Admitting: *Deleted

## 2019-11-14 ENCOUNTER — Other Ambulatory Visit: Payer: Self-pay

## 2019-11-14 ENCOUNTER — Ambulatory Visit (HOSPITAL_COMMUNITY)
Admission: RE | Admit: 2019-11-14 | Discharge: 2019-11-14 | Disposition: A | Discharge status: Home / Self Care | Payer: 59 | Source: Ambulatory Visit | Attending: Family Medicine | Admitting: Family Medicine

## 2019-11-14 ENCOUNTER — Other Ambulatory Visit (HOSPITAL_COMMUNITY): Payer: Self-pay | Admitting: *Deleted

## 2019-11-14 ENCOUNTER — Encounter (HOSPITAL_COMMUNITY): Payer: Self-pay

## 2019-11-14 ENCOUNTER — Ambulatory Visit (HOSPITAL_COMMUNITY)
Admission: RE | Admit: 2019-11-14 | Discharge: 2019-11-14 | Disposition: A | Discharge status: Home / Self Care | Payer: 59 | Source: Ambulatory Visit | Attending: Obstetrics and Gynecology | Admitting: Obstetrics and Gynecology

## 2019-11-14 DIAGNOSIS — Z3400 Encounter for supervision of normal first pregnancy, unspecified trimester: Secondary | ICD-10-CM | POA: Insufficient documentation

## 2019-11-14 DIAGNOSIS — R000 Tachycardia, unspecified: Secondary | ICD-10-CM

## 2019-11-14 DIAGNOSIS — O2692 Pregnancy related conditions, unspecified, second trimester: Secondary | ICD-10-CM | POA: Diagnosis not present

## 2019-11-14 DIAGNOSIS — J4520 Mild intermittent asthma, uncomplicated: Secondary | ICD-10-CM | POA: Diagnosis present

## 2019-11-14 DIAGNOSIS — Z3A23 23 weeks gestation of pregnancy: Secondary | ICD-10-CM | POA: Diagnosis not present

## 2019-11-14 DIAGNOSIS — O210 Mild hyperemesis gravidarum: Secondary | ICD-10-CM | POA: Diagnosis present

## 2019-11-14 DIAGNOSIS — O283 Abnormal ultrasonic finding on antenatal screening of mother: Secondary | ICD-10-CM

## 2019-11-14 MED ORDER — PROMETHAZINE HCL 25 MG/ML IJ SOLN
25.0000 mg | INTRAVENOUS | Status: DC
  Administered 2019-11-14: 25 mg via INTRAVENOUS
  Filled 2019-11-14: qty 1

## 2019-11-17 ENCOUNTER — Ambulatory Visit (HOSPITAL_COMMUNITY)
Admission: RE | Admit: 2019-11-17 | Discharge: 2019-11-17 | Disposition: A | Discharge status: Home / Self Care | Payer: 59 | Source: Ambulatory Visit | Attending: Family Medicine | Admitting: Family Medicine

## 2019-11-17 ENCOUNTER — Other Ambulatory Visit: Payer: Self-pay

## 2019-11-17 DIAGNOSIS — Z3A00 Weeks of gestation of pregnancy not specified: Secondary | ICD-10-CM | POA: Diagnosis not present

## 2019-11-17 DIAGNOSIS — O210 Mild hyperemesis gravidarum: Secondary | ICD-10-CM | POA: Diagnosis present

## 2019-11-17 MED ORDER — PROMETHAZINE HCL 25 MG/ML IJ SOLN
25.0000 mg | INTRAVENOUS | Status: DC
  Administered 2019-11-17: 25 mg via INTRAVENOUS
  Filled 2019-11-17: qty 1

## 2019-11-19 ENCOUNTER — Ambulatory Visit (HOSPITAL_COMMUNITY)
Admission: RE | Admit: 2019-11-19 | Discharge: 2019-11-19 | Disposition: A | Discharge status: Home / Self Care | Payer: 59 | Source: Ambulatory Visit | Attending: Family Medicine | Admitting: Family Medicine

## 2019-11-19 ENCOUNTER — Other Ambulatory Visit: Payer: Self-pay

## 2019-11-19 ENCOUNTER — Ambulatory Visit (INDEPENDENT_AMBULATORY_CARE_PROVIDER_SITE_OTHER): Payer: 59 | Admitting: Obstetrics & Gynecology

## 2019-11-19 VITALS — BP 100/68 | HR 114 | Wt 175.0 lb

## 2019-11-19 DIAGNOSIS — Z3400 Encounter for supervision of normal first pregnancy, unspecified trimester: Secondary | ICD-10-CM

## 2019-11-19 DIAGNOSIS — G43709 Chronic migraine without aura, not intractable, without status migrainosus: Secondary | ICD-10-CM

## 2019-11-19 DIAGNOSIS — J4520 Mild intermittent asthma, uncomplicated: Secondary | ICD-10-CM

## 2019-11-19 DIAGNOSIS — Z3A24 24 weeks gestation of pregnancy: Secondary | ICD-10-CM

## 2019-11-19 DIAGNOSIS — O26892 Other specified pregnancy related conditions, second trimester: Secondary | ICD-10-CM

## 2019-11-19 DIAGNOSIS — I4711 Inappropriate sinus tachycardia, so stated: Secondary | ICD-10-CM

## 2019-11-19 DIAGNOSIS — O210 Mild hyperemesis gravidarum: Secondary | ICD-10-CM | POA: Diagnosis present

## 2019-11-19 DIAGNOSIS — G4726 Circadian rhythm sleep disorder, shift work type: Secondary | ICD-10-CM

## 2019-11-19 DIAGNOSIS — R000 Tachycardia, unspecified: Secondary | ICD-10-CM

## 2019-11-19 MED ORDER — PROMETHAZINE HCL 25 MG/ML IJ SOLN
25.0000 mg | INTRAVENOUS | Status: DC
  Administered 2019-11-19: 25 mg via INTRAVENOUS
  Filled 2019-11-19: qty 1

## 2019-11-19 MED ORDER — ONDANSETRON HCL 8 MG PO TABS: 8.0000 mg | ORAL_TABLET | Freq: Three times a day (TID) | ORAL | 2 refills | Status: DC | PRN

## 2019-11-19 MED ORDER — ZOLPIDEM TARTRATE 5 MG PO TABS: 5.0000 mg | ORAL_TABLET | Freq: Every evening | ORAL | 1 refills | Status: DC | PRN

## 2019-11-19 NOTE — Progress Notes (Signed)
   PRENATAL VISIT NOTE  Subjective:  Michelle Vincent is a 29 y.o. G1P0000 at [redacted]w[redacted]d being seen today for ongoing prenatal care.  She is currently monitored for the following issues for this high-risk pregnancy and has Tachycardia; Supervision of normal first pregnancy, antepartum; Inappropriate sinus tachycardia; Allergic rhinitis; Chronic migraine without aura without status migrainosus, not intractable; Mild intermittent asthma without complication; Shift work sleep disorder; and Hyperemesis gravidarum on their problem list.  Patient reports occ lower abd pain that is sharp. .  Contractions: Not present. Vag. Bleeding: None.  Movement: Present. Denies leaking of fluid.   The following portions of the patient's history were reviewed and updated as appropriate: allergies, current medications, past family history, past medical history, past social history, past surgical history and problem list.   Objective:   Vitals:   11/19/19 1557  BP: 100/68  Pulse: (!) 114  Weight: 175 lb (79.4 kg)    Fetal Status: Fetal Heart Rate (bpm): 145   Movement: Present     General:  Alert, oriented and cooperative. Patient is in no acute distress.  Skin: Skin is warm and dry. No rash noted.   Cardiovascular: Normal heart rate noted  Respiratory: Normal respiratory effort, no problems with respiration noted  Abdomen: Soft, gravid, appropriate for gestational age.  Pain/Pressure: Absent     Pelvic: Cervical exam deferred        Extremities: Normal range of motion.  Edema: Trace  Mental Status: Normal mood and affect. Normal behavior. Normal judgment and thought content.   Assessment and Plan:  Pregnancy: G1P0000 at [redacted]w[redacted]d 1. Supervision of normal first pregnancy, antepartum FH WNL   2. Chronic migraine without aura without status migrainosus, not intractable  3. Hyperemesis gravidarum Pt is receiving phenergan infusions 3x/week. She reports that when she doesn't get them, she still gets sick and is  not functional  Refilled Zofran   4. Inappropriate sinus tachycardia  5. Mild intermittent asthma without complication  6. Shift work sleep disorder - zolpidem (AMBIEN) 5 MG tablet; Take 1 tablet (5 mg total) by mouth at bedtime as needed for sleep.  Dispense: 30 tablet; Refill: 1  Preterm labor symptoms and general obstetric precautions including but not limited to vaginal bleeding, contractions, leaking of fluid and fetal movement were reviewed in detail with the patient. Please refer to After Visit Summary for other counseling recommendations.   No follow-ups on file.  Future Appointments  Date Time Provider Department Center  11/21/2019  7:30 AM [facility] NURSE [facility]  11/21/2019  7:30 AM [facility] [place] 3 [facility]  11/21/2019 10:00 AM [facility] ROOM 8 [facility] None  12/12/2019  8:30 AM [facility] NURSE [facility]  12/12/2019  8:30 AM [facility] [place] 5 [facility]  12/26/2019  8:15 AM Stinson, Rhona Raider, DO [facility] None    Willodean Rosenthal, MD

## 2019-11-20 ENCOUNTER — Other Ambulatory Visit: Payer: Self-pay | Admitting: Family Medicine

## 2019-11-21 ENCOUNTER — Ambulatory Visit (HOSPITAL_COMMUNITY): Payer: 59 | Admitting: *Deleted

## 2019-11-21 ENCOUNTER — Other Ambulatory Visit: Payer: Self-pay

## 2019-11-21 ENCOUNTER — Encounter (HOSPITAL_COMMUNITY): Payer: Self-pay

## 2019-11-21 ENCOUNTER — Ambulatory Visit (HOSPITAL_COMMUNITY): Payer: 59

## 2019-11-21 ENCOUNTER — Ambulatory Visit (HOSPITAL_BASED_OUTPATIENT_CLINIC_OR_DEPARTMENT_OTHER)
Admission: RE | Admit: 2019-11-21 | Discharge: 2019-11-21 | Disposition: A | Discharge status: Home / Self Care | Payer: 59 | Source: Ambulatory Visit | Attending: Obstetrics | Admitting: Obstetrics

## 2019-11-21 ENCOUNTER — Ambulatory Visit (HOSPITAL_COMMUNITY)
Admission: RE | Admit: 2019-11-21 | Discharge: 2019-11-21 | Disposition: A | Discharge status: Home / Self Care | Payer: 59 | Source: Ambulatory Visit | Attending: Family Medicine | Admitting: Family Medicine

## 2019-11-21 DIAGNOSIS — O358XX0 Maternal care for other (suspected) fetal abnormality and damage, not applicable or unspecified: Secondary | ICD-10-CM | POA: Diagnosis not present

## 2019-11-21 DIAGNOSIS — Z36 Encounter for antenatal screening for chromosomal anomalies: Secondary | ICD-10-CM | POA: Insufficient documentation

## 2019-11-21 DIAGNOSIS — Z3A24 24 weeks gestation of pregnancy: Secondary | ICD-10-CM

## 2019-11-21 DIAGNOSIS — O2692 Pregnancy related conditions, unspecified, second trimester: Secondary | ICD-10-CM | POA: Diagnosis not present

## 2019-11-21 DIAGNOSIS — O210 Mild hyperemesis gravidarum: Secondary | ICD-10-CM

## 2019-11-21 DIAGNOSIS — Z3400 Encounter for supervision of normal first pregnancy, unspecified trimester: Secondary | ICD-10-CM | POA: Diagnosis present

## 2019-11-21 DIAGNOSIS — O283 Abnormal ultrasonic finding on antenatal screening of mother: Secondary | ICD-10-CM | POA: Diagnosis present

## 2019-11-21 MED ORDER — PROMETHAZINE HCL 25 MG/ML IJ SOLN
25.0000 mg | INTRAVENOUS | Status: DC
  Administered 2019-11-21: 25 mg via INTRAVENOUS
  Filled 2019-11-21: qty 1

## 2019-11-24 ENCOUNTER — Other Ambulatory Visit: Payer: Self-pay

## 2019-11-24 ENCOUNTER — Encounter (HOSPITAL_COMMUNITY)
Admission: RE | Admit: 2019-11-24 | Discharge: 2019-11-24 | Disposition: A | Discharge status: Home / Self Care | Payer: 59 | Source: Ambulatory Visit | Attending: Family Medicine | Admitting: Family Medicine

## 2019-11-24 DIAGNOSIS — Z3A00 Weeks of gestation of pregnancy not specified: Secondary | ICD-10-CM | POA: Insufficient documentation

## 2019-11-24 DIAGNOSIS — O210 Mild hyperemesis gravidarum: Secondary | ICD-10-CM | POA: Insufficient documentation

## 2019-11-24 MED ORDER — PROMETHAZINE HCL 25 MG/ML IJ SOLN
25.0000 mg | INTRAVENOUS | Status: DC
  Administered 2019-11-24: 25 mg via INTRAVENOUS
  Filled 2019-11-24: qty 1

## 2019-11-25 ENCOUNTER — Telehealth (HOSPITAL_COMMUNITY): Payer: Self-pay | Admitting: Genetic Counselor

## 2019-11-26 ENCOUNTER — Other Ambulatory Visit: Payer: Self-pay

## 2019-11-26 ENCOUNTER — Encounter (HOSPITAL_COMMUNITY): Payer: 59

## 2019-11-26 ENCOUNTER — Ambulatory Visit (HOSPITAL_COMMUNITY)
Admission: RE | Admit: 2019-11-26 | Discharge: 2019-11-26 | Disposition: A | Discharge status: Home / Self Care | Payer: 59 | Source: Ambulatory Visit | Attending: Family Medicine | Admitting: Family Medicine

## 2019-11-26 DIAGNOSIS — O210 Mild hyperemesis gravidarum: Secondary | ICD-10-CM | POA: Diagnosis present

## 2019-11-26 DIAGNOSIS — Z3A00 Weeks of gestation of pregnancy not specified: Secondary | ICD-10-CM | POA: Diagnosis not present

## 2019-11-26 MED ORDER — PROMETHAZINE HCL 25 MG/ML IJ SOLN
25.0000 mg | INTRAVENOUS | Status: DC
  Administered 2019-11-26: 25 mg via INTRAVENOUS
  Filled 2019-11-26: qty 1

## 2019-11-26 NOTE — Telephone Encounter (Signed)
LVM for Ms. Gouin discussingher negative FISHresults from her amniocentesis. No numerical abnormalities were identified for chromosomes X, Y, 13, 18, or 21, reducing the likelihood of trisomy 11, trisomy 73, trisomy 80, andsex chromosome abnormalitiesin the fetus.I reviewed that while FISH results are not considered diagnostic, this result is reassuring that the fetus likely does not have Down syndrome. Fetal karyotype on the amniocentesis sample is still being completed. While it is expected that karyotype will confirm the results from [facility], there is the possibility of something else being identified on karyotype, such as a chromosomal rearrangement or an abnormality involving a chromosome other than the ones evaluated via FISH. I encouraged Ms. Pastorino to call back if she has any questions; otherwise, I will call her once results from karyotype become available.  Gershon Crane, MS Genetic Counselor

## 2019-11-28 ENCOUNTER — Ambulatory Visit (HOSPITAL_COMMUNITY)
Admission: RE | Admit: 2019-11-28 | Discharge: 2019-11-28 | Disposition: A | Discharge status: Home / Self Care | Payer: 59 | Source: Ambulatory Visit | Attending: Family Medicine | Admitting: Family Medicine

## 2019-11-28 ENCOUNTER — Other Ambulatory Visit: Payer: Self-pay

## 2019-11-28 ENCOUNTER — Telehealth: Payer: Self-pay | Admitting: Cardiology

## 2019-11-28 DIAGNOSIS — Z3A00 Weeks of gestation of pregnancy not specified: Secondary | ICD-10-CM | POA: Diagnosis not present

## 2019-11-28 DIAGNOSIS — O210 Mild hyperemesis gravidarum: Secondary | ICD-10-CM | POA: Insufficient documentation

## 2019-11-28 MED ORDER — PROMETHAZINE HCL 25 MG/ML IJ SOLN
25.0000 mg | INTRAVENOUS | Status: DC
  Administered 2019-11-28: 25 mg via INTRAVENOUS
  Filled 2019-11-28: qty 1

## 2019-11-28 NOTE — Telephone Encounter (Signed)
I spoke with patient. She reports she was doing OK on Propranolol. About 3-4 weeks ago her OB doctor changed her to labetalol. For the last 2-3 weeks her blood pressure has been running low at times. She reports readings of 80/46 and 88/56.  When BP is low she doesn't feel good.  She gets IV infusions of LR and phenergan and BP is checked when she gets these.  Today it was 111/68 prior to infusion and 91/56 after infusion.  Resting heart rate is now 100-130 since she was changed to Labetalol.   She is trying to get compression stockings but has not able to get fitted. Store she was going to use is closed due to covid.

## 2019-11-28 NOTE — Telephone Encounter (Signed)
Pt c/o BP issue: STAT if pt c/o blurred vision, one-sided weakness or slurred speech  1. What are your last 5 BP readings? 80/46. 88/56 and   2. Are you having any other symptoms (ex. Dizziness, headache, blurred vision, passed out)? Heart rate been at 100, dizzy sometimes  3. What is your BP issue? Blood pressure is running low

## 2019-11-29 NOTE — Telephone Encounter (Signed)
Please send her to the store in [place] to get stockings. (We have fliers).  There is no contraindication to propranolol with pregnancy.  She would have to ask the Ob why she was switched.  If she was doing better on this I would switch back from labetalol to propranolol.

## 2019-12-01 ENCOUNTER — Other Ambulatory Visit: Payer: Self-pay

## 2019-12-01 ENCOUNTER — Ambulatory Visit (HOSPITAL_COMMUNITY)
Admission: RE | Admit: 2019-12-01 | Discharge: 2019-12-01 | Disposition: A | Discharge status: Home / Self Care | Payer: 59 | Source: Ambulatory Visit | Attending: Family Medicine | Admitting: Family Medicine

## 2019-12-01 DIAGNOSIS — Z3A00 Weeks of gestation of pregnancy not specified: Secondary | ICD-10-CM | POA: Insufficient documentation

## 2019-12-01 DIAGNOSIS — O210 Mild hyperemesis gravidarum: Secondary | ICD-10-CM | POA: Diagnosis present

## 2019-12-01 MED ORDER — PROMETHAZINE HCL 25 MG/ML IJ SOLN
25.0000 mg | INTRAVENOUS | Status: DC
  Administered 2019-12-01: 25 mg via INTRAVENOUS
  Filled 2019-12-01: qty 1

## 2019-12-01 NOTE — Telephone Encounter (Signed)
Patient is returning call for recommendations for Dr. Antoine Poche. Please call.

## 2019-12-01 NOTE — Telephone Encounter (Signed)
Left message for patient to call back for recommendations from Dr. Antoine Poche.

## 2019-12-01 NOTE — Telephone Encounter (Signed)
Spoke with patient, informed patient Dr. Antoine Poche would like her to follow up with OB about medication change. He is okay to change back to propranolol if OB agrees but OB should make the change. Patient advised to purchase compression stockings. Patient verbalized understanding.

## 2019-12-02 ENCOUNTER — Encounter (HOSPITAL_BASED_OUTPATIENT_CLINIC_OR_DEPARTMENT_OTHER): Payer: Self-pay | Admitting: *Deleted

## 2019-12-02 ENCOUNTER — Emergency Department (HOSPITAL_BASED_OUTPATIENT_CLINIC_OR_DEPARTMENT_OTHER)
Admission: EM | Admit: 2019-12-02 | Discharge: 2019-12-02 | Disposition: A | Discharge status: Home / Self Care | Payer: 59 | Attending: Emergency Medicine | Admitting: Emergency Medicine

## 2019-12-02 ENCOUNTER — Other Ambulatory Visit: Payer: Self-pay

## 2019-12-02 ENCOUNTER — Other Ambulatory Visit (HOSPITAL_COMMUNITY): Payer: Self-pay | Admitting: *Deleted

## 2019-12-02 DIAGNOSIS — O210 Mild hyperemesis gravidarum: Secondary | ICD-10-CM

## 2019-12-02 DIAGNOSIS — J45909 Unspecified asthma, uncomplicated: Secondary | ICD-10-CM | POA: Diagnosis not present

## 2019-12-02 DIAGNOSIS — Z79899 Other long term (current) drug therapy: Secondary | ICD-10-CM | POA: Insufficient documentation

## 2019-12-02 DIAGNOSIS — Z3A26 26 weeks gestation of pregnancy: Secondary | ICD-10-CM

## 2019-12-02 DIAGNOSIS — O9952 Diseases of the respiratory system complicating childbirth: Secondary | ICD-10-CM | POA: Insufficient documentation

## 2019-12-02 DIAGNOSIS — Z91018 Allergy to other foods: Secondary | ICD-10-CM | POA: Insufficient documentation

## 2019-12-02 DIAGNOSIS — O212 Late vomiting of pregnancy: Secondary | ICD-10-CM | POA: Diagnosis present

## 2019-12-02 DIAGNOSIS — Z91013 Allergy to seafood: Secondary | ICD-10-CM | POA: Insufficient documentation

## 2019-12-02 DIAGNOSIS — Z91048 Other nonmedicinal substance allergy status: Secondary | ICD-10-CM | POA: Diagnosis not present

## 2019-12-02 DIAGNOSIS — R112 Nausea with vomiting, unspecified: Secondary | ICD-10-CM

## 2019-12-02 LAB — CBC WITH DIFFERENTIAL/PLATELET
Abs Immature Granulocytes: 0.14 10*3/uL — ABNORMAL HIGH (ref 0.00–0.07)
Basophils Absolute: 0 10*3/uL (ref 0.0–0.1)
Basophils Relative: 0 %
Eosinophils Absolute: 0.3 10*3/uL (ref 0.0–0.5)
Eosinophils Relative: 2 %
HCT: 35.2 % — ABNORMAL LOW (ref 36.0–46.0)
Hemoglobin: 11.5 g/dL — ABNORMAL LOW (ref 12.0–15.0)
Immature Granulocytes: 1 %
Lymphocytes Relative: 12 %
Lymphs Abs: 1.6 10*3/uL (ref 0.7–4.0)
MCH: 29.6 pg (ref 26.0–34.0)
MCHC: 32.7 g/dL (ref 30.0–36.0)
MCV: 90.5 fL (ref 80.0–100.0)
Monocytes Absolute: 0.7 10*3/uL (ref 0.1–1.0)
Monocytes Relative: 5 %
Neutro Abs: 11.2 10*3/uL — ABNORMAL HIGH (ref 1.7–7.7)
Neutrophils Relative %: 80 %
Platelets: 315 10*3/uL (ref 150–400)
RBC: 3.89 MIL/uL (ref 3.87–5.11)
RDW: 11.6 % (ref 11.5–15.5)
WBC: 14 10*3/uL — ABNORMAL HIGH (ref 4.0–10.5)
nRBC: 0 % (ref 0.0–0.2)

## 2019-12-02 LAB — BASIC METABOLIC PANEL
Anion gap: 8 (ref 5–15)
BUN: 8 mg/dL (ref 6–20)
CO2: 19 mmol/L — ABNORMAL LOW (ref 22–32)
Calcium: 8.6 mg/dL — ABNORMAL LOW (ref 8.9–10.3)
Chloride: 107 mmol/L (ref 98–111)
Creatinine, Ser: 0.63 mg/dL (ref 0.44–1.00)
GFR calc Af Amer: >60 mL/min (ref >60)
GFR calc non Af Amer: >60 mL/min (ref >60)
Glucose, Bld: 79 mg/dL (ref 70–99)
Potassium: 3.8 mmol/L (ref 3.5–5.1)
Sodium: 134 mmol/L — ABNORMAL LOW (ref 135–145)

## 2019-12-02 MED ORDER — LACTATED RINGERS IV BOLUS
1000.0000 mL | Freq: Once | INTRAVENOUS | Status: AC
  Administered 2019-12-02: 1000 mL via INTRAVENOUS

## 2019-12-02 MED ORDER — PROMETHAZINE HCL 25 MG/ML IJ SOLN
25.0000 mg | Freq: Once | INTRAMUSCULAR | Status: AC
  Administered 2019-12-02: 25 mg via INTRAVENOUS
  Filled 2019-12-02: qty 1

## 2019-12-02 NOTE — ED Notes (Signed)
Noted pt's IV has infiltrated with swelling around the site. Total of 200 mL of LR was infused. It was noted that with phenergan admin earlier line was flushed, med was given, line was flushed again and the noted blood return in the line after administration prior to starting LR infusion. IV was d/c'd and warm compress applied. EDP notified.

## 2019-12-02 NOTE — ED Provider Notes (Signed)
[facility] EMERGENCY DEPARTMENT Provider Note   CSN: [id number] Arrival date & time: 12/02/19  1303     History Chief Complaint  Patient presents with  . Emesis During Pregnancy    Michelle Vincent is a 29 y.o. female G1 P0, SVT, chronic headache, asthma Zentz for evaluation of persistent emesis.  Patient with multiple emesis of NBNB emesis over last 8 hours.  History of hyperemesis gravidarum, get treated outpatient with IV fluids 3 times a week as well as IV Phenergan.  Last dose yesterday.  Denies abdominal pain, vaginal bleeding, leaking of fluid.  Denies fever, chills, chest pain, shortness of breath, abdominal pain, contractions, dysuria, hematuria, diarrhea or constipation.  She has been unable to tolerate p.o. intake, small sips of liquids over the last 8 hours.  Has tried her home Zofran and Reglan without relief of her symptoms.  She is feeling baby move.  No other was complications with her pregnancy.  History obtained from patient and past medical records.  No interpreter is used.   Ellison Hughs with [facility] [place] M[facility]  HPI     Past Medical History:  Diagnosis Date  . Anemia   . Anxiety    takes lexapro  . Asthma    prn inhaler use  . HA (headache)   . Migraine   . Occipital neuralgia   . Sinus tachycardia   . SVT (supraventricular tachycardia) [facility])     Patient Active Problem List   Diagnosis Date Noted  . Hyperemesis gravidarum 08/29/2019  . Supervision of normal first pregnancy, antepartum 08/12/2019  . Allergic rhinitis 08/12/2019  . Tachycardia 03/03/2019  . 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 Mild intermittent asthma without complication 12/27/2017  . Chronic migraine without aura without status migrainosus, not intractable 05/24/2017  . 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 shoulder Right   . TONSILLECTOMY AND ADENOIDECTOMY      . tubes in ears       OB History    Gravida  1   Para  0   Term  0   Preterm  0   AB  0   Living  0     SAB  0   TAB  0   Ectopic  0   Multiple  0   Live Births  0           Family History  Problem Relation Age of Onset  . Hypertension Mother   . Transient ischemic attack Mother   . Diabetes Brother        Type 1  . Breast cancer Maternal Aunt   . Cancer Maternal Aunt     Social History   Tobacco Use  . Smoking status: Never Smoker  . Smokeless tobacco: Never Used  Substance Use Topics  . Alcohol use: Not Currently  . Drug use: Never    Home Medications Prior to Admission medications   Medication Sig Start Date End Date Taking? Authorizing Provider  Butalbital-APAP-Caffeine 50-325-40 MG capsule Take 1-2 capsules by mouth every 6 (six) hours as needed for headache. 10/10/19   Glyn Ade, Scot Jun, PA-C  cyclobenzaprine (FLEXERIL) 10 MG tablet Take 1 tablet (10 mg total) by mouth 3 (three) times daily as needed for muscle spasms. 10/10/19   Bertram Denver, PA-C  Elastic Bandages & Supports (COMFORT FIT MATERNITY SUPP SM) MISC 1 Units by Does not  apply route daily as needed. 11/08/19   Nugent, Gerrie Nordmann, NP  escitalopram (LEXAPRO) 10 MG tablet Take 1 tablet (10 mg total) by mouth daily. 09/09/19   Truett Mainland, DO  famotidine (PEPCID) 20 MG tablet Take 1 tablet (20 mg total) by mouth 2 (two) times daily. 09/09/19   Truett Mainland, DO  ferrous sulfate 325 (65 FE) MG tablet Take 325 mg by mouth 3 (three) times daily. 12/04/14   [provider]  labetalol (NORMODYNE) 100 MG tablet Take 2 tablets (200 mg total) by mouth 2 (two) times daily. 11/05/19   Lavonia Drafts, MD  Magnesium 400 MG TABS Take 400 mg by mouth daily. Patient not taking: Reported on 10/22/2019 09/29/19   Isla Pence, MD  metoCLOPramide (REGLAN) 10 MG tablet Take 1 tablet (10 mg total) by mouth 3 (three) times daily before meals. Patient not taking: Reported on  11/19/2019 09/15/19   Rasch, Anderson Malta I, NP  ondansetron (ZOFRAN) 8 MG tablet Take 1 tablet (8 mg total) by mouth every 8 (eight) hours as needed for nausea or vomiting. 11/19/19   Lavonia Drafts, MD  Prenatal Vit-Fe Fumarate-FA (PRENATAL MULTIVITAMIN) TABS tablet Take 1 tablet by mouth daily at 12 noon.    [provider]  promethazine (PHENERGAN) 25 MG suppository Place 1 suppository (25 mg total) rectally every 6 (six) hours as needed for nausea or vomiting. 09/23/19   Truett Mainland, DO  zolpidem (AMBIEN) 5 MG tablet Take 1 tablet (5 mg total) by mouth at bedtime as needed for sleep. Patient not taking: Reported on 11/19/2019 11/04/19   Lavonia Drafts, MD  zolpidem (AMBIEN) 5 MG tablet Take 1 tablet (5 mg total) by mouth at bedtime as needed for sleep. 11/19/19   Lavonia Drafts, MD    Allergies    Nitrous oxide, Peach flavor, Shellfish allergy, and Adhesive [tape]  Review of Systems   Review of Systems  Constitutional: Negative.   HENT: Negative.   Respiratory: Negative.   Cardiovascular: Negative.   Gastrointestinal: Positive for nausea and vomiting. Negative for abdominal distention, abdominal pain, anal bleeding, blood in stool, constipation, diarrhea and rectal pain.  Genitourinary: Negative.   Musculoskeletal: Negative.   Skin: Negative.   Neurological: Negative.   All other systems reviewed and are negative.   Physical Exam Updated Vital Signs BP 104/67 (BP Location: Left Arm)   Pulse 80   Temp 98.4 F (36.9 C) (Oral)   Resp 16   Ht 5\' 7"  (1.702 m)   Wt 80.7 kg   LMP 06/01/2019   SpO2 100%   BMI 27.88 kg/m   Physical Exam Vitals and nursing note reviewed.  Constitutional:      General: She is not in acute distress.    Appearance: She is well-developed. She is not ill-appearing, toxic-appearing or diaphoretic.  HENT:     Head: Normocephalic and atraumatic.     Nose: Nose normal.     Mouth/Throat:     Mouth: Mucous membranes  are moist.     Pharynx: Oropharynx is clear.  Eyes:     Pupils: Pupils are equal, round, and reactive to light.  Cardiovascular:     Rate and Rhythm: Normal rate.     Pulses: Normal pulses.     Heart sounds: Normal heart sounds.  Pulmonary:     Effort: Pulmonary effort is normal. No respiratory distress.     Breath sounds: Normal breath sounds.  Abdominal:     General: Bowel sounds are normal. There  is no distension.     Palpations: Abdomen is soft.     Comments: Gravid abdomen above umbilicus.  Soft.  No overlying skin changes.  Musculoskeletal:        General: Normal range of motion.     Cervical back: Normal range of motion.     Comments: Denna Haggard' sign negative.  Moves all 4 extremities slight difficulty.  Compartment soft.  Skin:    General: Skin is warm and dry.     Capillary Refill: Capillary refill takes less than 2 seconds.     Comments: Brisk capillary refill.  No pallor.  Neurological:     General: No focal deficit present.     Mental Status: She is alert and oriented to person, place, and time.     Gait: Gait normal.     Comments: Ambulatory in ED without difficulty.     ED Results / Procedures / Treatments   Labs (all labs ordered are listed, but only abnormal results are displayed) Labs Reviewed  CBC WITH DIFFERENTIAL/PLATELET - Abnormal; Notable for the following components:      Result Value   WBC 14.0 (*)    Hemoglobin 11.5 (*)    HCT 35.2 (*)    Neutro Abs 11.2 (*)    Abs Immature Granulocytes 0.14 (*)    All other components within normal limits  BASIC METABOLIC PANEL - Abnormal; Notable for the following components:   Sodium 134 (*)    CO2 19 (*)    Calcium 8.6 (*)    All other components within normal limits    EKG None  Radiology No results found.  Procedures Procedures (including critical care time)  Medications Ordered in ED Medications  lactated ringers bolus 1,000 mL (1,000 mLs Intravenous New Bag/Given 12/02/19 1440)  promethazine  (PHENERGAN) injection 25 mg (25 mg Intravenous Given 12/02/19 1438)    ED Course  I have reviewed the triage vital signs and the nursing notes.  Pertinent labs & imaging results that were available during my care of the patient were reviewed by me and considered in my medical decision making (see chart for details).  29 year old female, G1, P0, [redacted] weeks pregnant by Dr. Adrian Blackwater with [facility] presents for evaluation of persistent emesis.  Patient with diagnosis of hyperemesis gravidarum.  Afebrile, nonseptic, non-ill-appearing.  She gets LR infusions as well as IV Phenergan 3 times a week.  She takes Reglan and Zofran at home.  Patient last infusion yesterday.  Persistent NBNB emesis over the last 8 hours.  Unable to tolerate p.o. intake.  No abdominal pain, vaginal bleeding or leaking of fluids.  She is feeling baby move.  Heart and lungs clear.  Gravid abdomen.  Plan for basic labs, IV fluids, Phenergan and reevaluate.  Fetal HT 149. Patient voices active movement without abd pain, contractions, vag bleeding, vaginal fluid leakage.  1500 : Patient reassessed.  Without Emesis here in the ED.  She is feeling much improved would like p.o. challenge.  1600: Unfortunately patient's IV infiltrated with fluids however did not infiltrate with her Phenergan.  She only received approximately 300 cc of her fluid bag.  She is able to tolerate a Sprite without difficulty.  Patient states she does not want additional IV attempt and does not want to receive her remaining fluids.  She does have appointment scheduled tomorrow at the [facility] clinic for fluids and additional Phenergan.  She has Zofran and Reglan at home for any of her symptoms.  States she feels  much improved and would like discharge.  Emesis likely due to her hyperemesis gravidarum.  Low suspicion for acute bacterial infection, early labor as cause of her symptoms.  Discussed strict return precautions.  Patient voiced understanding and is agreeable for  follow-up.  She is to notify her OB that she was seen in the emergency department for evaluation.  The patient has been appropriately medically screened and/or stabilized in the ED. I have low suspicion for any other emergent medical condition which would require further screening, evaluation or treatment in the ED or require inpatient management.  Patient is hemodynamically stable and in no acute distress.  Patient able to ambulate in department prior to ED.  Evaluation does not show acute pathology that would require ongoing or additional emergent interventions while in the emergency department or further inpatient treatment.  I have discussed the diagnosis with the patient and answered all questions.  Pain is been managed while in the emergency department and patient has no further complaints prior to discharge.  Patient is comfortable with plan discussed in room and is stable for discharge at this time.  I have discussed strict return precautions for returning to the emergency department.  Patient was encouraged to follow-up with PCP/specialist refer to at discharge.      MDM Rules/Calculators/[name]                       Final Clinical Impression(s) / ED Diagnoses Final diagnoses:  Hyperemesis gravidarum  Non-intractable vomiting with nausea, unspecified vomiting type  [redacted] weeks gestation of pregnancy    Rx / DC Orders ED Discharge Orders    None       Doratha Mcswain A, PA-C 12/02/19 1607    Maia Plan, MD 12/03/19 1407

## 2019-12-02 NOTE — ED Notes (Signed)
Pt given sprite for PO challenge. Pt denies pain at this time, provided pt with warm blanket per request

## 2019-12-02 NOTE — Discharge Instructions (Signed)
You may take your home medications for nausea and vomiting at home.  Make sure to keep your appointment follow-up with the hydration clinic tomorrow.  Call OB to let them know you are seen in the emergency department.  If you develop any abdominal pain, vaginal bleeding or vaginal fluid leaking please present to MAU at [facility].

## 2019-12-02 NOTE — ED Triage Notes (Signed)
Vomiting during pregnancy. She has been getting Phenergan and LR for same since the beginning of her pregnancy.

## 2019-12-03 ENCOUNTER — Encounter (HOSPITAL_COMMUNITY)
Admission: RE | Admit: 2019-12-03 | Discharge: 2019-12-03 | Disposition: A | Discharge status: Home / Self Care | Payer: 59 | Source: Ambulatory Visit | Attending: Family Medicine | Admitting: Family Medicine

## 2019-12-03 ENCOUNTER — Telehealth (HOSPITAL_COMMUNITY): Payer: Self-pay | Admitting: Genetic Counselor

## 2019-12-03 DIAGNOSIS — O210 Mild hyperemesis gravidarum: Secondary | ICD-10-CM | POA: Diagnosis not present

## 2019-12-03 MED ORDER — PROMETHAZINE HCL 25 MG/ML IJ SOLN
25.0000 mg | INTRAVENOUS | Status: DC
  Administered 2019-12-03: 25 mg via INTRAVENOUS
  Filled 2019-12-03: qty 1

## 2019-12-03 NOTE — Telephone Encounter (Signed)
I called [name] to discuss her normal karyotype results from amniocentesis. Chromosomal analysisrevealed a normalmale karyotype (46,XY). Thissignificantly reducesthe possibility of a chromosomal aneuploidy such as Down syndrome, trisomy 77, or trisomy 18in this fetus,as amniocentesisis able to diagnose chromosome aneuploidies with a sensitivity of>99%.[name] was reminded that karyotype cannot assess for all possible genetic syndromes in a baby. However, this negative result does rule out both full trisomy and translocations that can cause Down syndrome.  I informed [name] that I had obtained prior authorization for a chromosomal microarray from her insurance company. Chromosomal microarray assesses for smaller pieces of chromosomal material that are missing or extra that fall below the detection range for karyotype. These chromosomal changes can be associated with various microdeletion or microduplication syndromes that could have impacts on the health or developmental of an affected individual. However, chromosomal microarray can also detect some microdeletion and microduplications that have unclear clinical relevance, which could potentially be anxiety-provoking for some people. The yield of chromosomal microarray for an isolated EIF is also likely low. Since there are still cells remaining from the amniocentesis, I inquired whether [name] was interested in [employer]or, minerals. After careful consideration, she declined chromosomal microarray at this time. She confirmed that she had no further questions.  Michelle Crane, MS Genetic Counselor

## 2019-12-05 ENCOUNTER — Other Ambulatory Visit: Payer: Self-pay

## 2019-12-05 ENCOUNTER — Ambulatory Visit (HOSPITAL_COMMUNITY)
Admission: RE | Admit: 2019-12-05 | Discharge: 2019-12-05 | Disposition: A | Discharge status: Home / Self Care | Payer: 59 | Source: Ambulatory Visit | Attending: Family Medicine | Admitting: Family Medicine

## 2019-12-05 DIAGNOSIS — O210 Mild hyperemesis gravidarum: Secondary | ICD-10-CM | POA: Insufficient documentation

## 2019-12-05 MED ORDER — PROMETHAZINE HCL 25 MG/ML IJ SOLN
25.0000 mg | INTRAVENOUS | Status: DC
  Administered 2019-12-05: 25 mg via INTRAVENOUS
  Filled 2019-12-05: qty 1

## 2019-12-08 ENCOUNTER — Other Ambulatory Visit: Payer: Self-pay

## 2019-12-08 ENCOUNTER — Encounter (HOSPITAL_COMMUNITY)
Admission: RE | Admit: 2019-12-08 | Discharge: 2019-12-08 | Disposition: A | Discharge status: Home / Self Care | Payer: 59 | Source: Ambulatory Visit | Attending: Family Medicine | Admitting: Family Medicine

## 2019-12-08 DIAGNOSIS — O210 Mild hyperemesis gravidarum: Secondary | ICD-10-CM | POA: Insufficient documentation

## 2019-12-08 DIAGNOSIS — Z3A00 Weeks of gestation of pregnancy not specified: Secondary | ICD-10-CM | POA: Insufficient documentation

## 2019-12-08 MED ORDER — PROMETHAZINE HCL 25 MG/ML IJ SOLN
25.0000 mg | INTRAVENOUS | Status: DC
  Administered 2019-12-08: 25 mg via INTRAVENOUS
  Filled 2019-12-08: qty 1

## 2019-12-09 ENCOUNTER — Encounter (HOSPITAL_COMMUNITY): Payer: 59

## 2019-12-09 LAB — CHROMOSOME, AMNIOTIC FLUID
Cells Analyzed: 15
Cells Counted: 15
Cells Karyotyped: 0
Colonies: 15
GTG Band Resolution Achieved: 450

## 2019-12-09 LAB — MATERNAL CELL CONTAMINATION

## 2019-12-09 LAB — INSIGHT AMNIO FISH XY,13,18,21
Cells Analyzed: 50
Cells Counted: 50

## 2019-12-09 LAB — MCC TRACKING

## 2019-12-10 ENCOUNTER — Other Ambulatory Visit: Payer: Self-pay

## 2019-12-10 ENCOUNTER — Encounter (HOSPITAL_COMMUNITY)
Admission: RE | Admit: 2019-12-10 | Discharge: 2019-12-10 | Disposition: A | Discharge status: Home / Self Care | Payer: 59 | Source: Ambulatory Visit | Attending: Family Medicine | Admitting: Family Medicine

## 2019-12-10 DIAGNOSIS — O210 Mild hyperemesis gravidarum: Secondary | ICD-10-CM | POA: Diagnosis not present

## 2019-12-10 MED ORDER — PROMETHAZINE HCL 25 MG/ML IJ SOLN
25.0000 mg | INTRAVENOUS | Status: DC
  Administered 2019-12-10: 12:00:00 25 mg via INTRAVENOUS
  Filled 2019-12-10: qty 1

## 2019-12-11 ENCOUNTER — Other Ambulatory Visit: Payer: Self-pay | Admitting: Family Medicine

## 2019-12-11 ENCOUNTER — Other Ambulatory Visit: Payer: Self-pay

## 2019-12-11 DIAGNOSIS — R000 Tachycardia, unspecified: Secondary | ICD-10-CM

## 2019-12-11 MED ORDER — LABETALOL HCL 100 MG PO TABS: 200.0000 mg | ORAL_TABLET | Freq: Two times a day (BID) | ORAL | 0 refills | Status: DC

## 2019-12-11 NOTE — Progress Notes (Signed)
Patient called - having "jerking" of upper body with infusion of phenergan. Has happened before. Jerking quite painful and lasts for a couple of hours after infusion is done. Got IV benadryl in ED before, which helped. Will give IV benadryl with next infusion.

## 2019-12-12 ENCOUNTER — Encounter (HOSPITAL_COMMUNITY): Payer: Self-pay | Admitting: *Deleted

## 2019-12-12 ENCOUNTER — Ambulatory Visit (HOSPITAL_COMMUNITY): Payer: 59 | Admitting: *Deleted

## 2019-12-12 ENCOUNTER — Other Ambulatory Visit: Payer: Self-pay

## 2019-12-12 ENCOUNTER — Ambulatory Visit (HOSPITAL_COMMUNITY)
Admission: RE | Admit: 2019-12-12 | Discharge: 2019-12-12 | Disposition: A | Discharge status: Home / Self Care | Payer: 59 | Source: Ambulatory Visit | Attending: Obstetrics | Admitting: Obstetrics

## 2019-12-12 ENCOUNTER — Encounter (HOSPITAL_COMMUNITY)
Admission: RE | Admit: 2019-12-12 | Discharge: 2019-12-12 | Disposition: A | Discharge status: Home / Self Care | Payer: 59 | Source: Ambulatory Visit | Attending: Family Medicine | Admitting: Family Medicine

## 2019-12-12 DIAGNOSIS — Z3400 Encounter for supervision of normal first pregnancy, unspecified trimester: Secondary | ICD-10-CM | POA: Insufficient documentation

## 2019-12-12 DIAGNOSIS — Z3A27 27 weeks gestation of pregnancy: Secondary | ICD-10-CM

## 2019-12-12 DIAGNOSIS — Z362 Encounter for other antenatal screening follow-up: Secondary | ICD-10-CM

## 2019-12-12 DIAGNOSIS — O210 Mild hyperemesis gravidarum: Secondary | ICD-10-CM | POA: Diagnosis not present

## 2019-12-12 DIAGNOSIS — O283 Abnormal ultrasonic finding on antenatal screening of mother: Secondary | ICD-10-CM | POA: Diagnosis present

## 2019-12-12 MED ORDER — PROMETHAZINE HCL 25 MG/ML IJ SOLN
25.0000 mg | INTRAVENOUS | Status: DC
  Administered 2019-12-12: 25 mg via INTRAVENOUS
  Filled 2019-12-12: qty 1

## 2019-12-12 MED ORDER — DIPHENHYDRAMINE HCL 50 MG/ML IJ SOLN
INTRAMUSCULAR | Status: AC
  Filled 2019-12-12: qty 1

## 2019-12-12 MED ORDER — DIPHENHYDRAMINE HCL 50 MG/ML IJ SOLN
25.0000 mg | Freq: Once | INTRAMUSCULAR | Status: AC
  Administered 2019-12-12: 10:00:00 25 mg via INTRAVENOUS

## 2019-12-15 ENCOUNTER — Encounter (HOSPITAL_COMMUNITY): Payer: Self-pay | Admitting: Obstetrics and Gynecology

## 2019-12-15 ENCOUNTER — Inpatient Hospital Stay (HOSPITAL_COMMUNITY)
Admission: AD | Admit: 2019-12-15 | Discharge: 2019-12-15 | Disposition: A | Discharge status: Home / Self Care | Payer: 59 | Attending: Obstetrics and Gynecology | Admitting: Obstetrics and Gynecology

## 2019-12-15 ENCOUNTER — Encounter (HOSPITAL_COMMUNITY)
Admission: RE | Admit: 2019-12-15 | Discharge: 2019-12-15 | Disposition: A | Discharge status: Home / Self Care | Payer: 59 | Source: Ambulatory Visit | Attending: Family Medicine | Admitting: Family Medicine

## 2019-12-15 ENCOUNTER — Other Ambulatory Visit: Payer: Self-pay | Admitting: Obstetrics & Gynecology

## 2019-12-15 ENCOUNTER — Other Ambulatory Visit: Payer: Self-pay

## 2019-12-15 ENCOUNTER — Telehealth: Payer: Self-pay

## 2019-12-15 ENCOUNTER — Other Ambulatory Visit: Payer: Self-pay | Admitting: Family Medicine

## 2019-12-15 DIAGNOSIS — Z3A28 28 weeks gestation of pregnancy: Secondary | ICD-10-CM | POA: Diagnosis not present

## 2019-12-15 DIAGNOSIS — R102 Pelvic and perineal pain: Secondary | ICD-10-CM | POA: Diagnosis not present

## 2019-12-15 DIAGNOSIS — Z3400 Encounter for supervision of normal first pregnancy, unspecified trimester: Secondary | ICD-10-CM

## 2019-12-15 DIAGNOSIS — O26893 Other specified pregnancy related conditions, third trimester: Secondary | ICD-10-CM | POA: Diagnosis present

## 2019-12-15 LAB — URINALYSIS, ROUTINE W REFLEX MICROSCOPIC
Bilirubin Urine: NEGATIVE
Glucose, UA: NEGATIVE mg/dL
Hgb urine dipstick: NEGATIVE
Ketones, ur: NEGATIVE mg/dL
Leukocytes,Ua: NEGATIVE
Nitrite: NEGATIVE
Protein, ur: NEGATIVE mg/dL
Specific Gravity, Urine: 1.005 (ref 1.005–1.030)
pH: 8 (ref 5.0–8.0)

## 2019-12-15 LAB — WET PREP, GENITAL
Clue Cells Wet Prep HPF POC: NONE SEEN
Sperm: NONE SEEN
Trich, Wet Prep: NONE SEEN
Yeast Wet Prep HPF POC: NONE SEEN

## 2019-12-15 LAB — FETAL FIBRONECTIN: Fetal Fibronectin: NEGATIVE

## 2019-12-15 MED ORDER — NIFEDIPINE 10 MG PO CAPS
10.0000 mg | ORAL_CAPSULE | ORAL | Status: AC
  Administered 2019-12-15 (×3): 10 mg via ORAL
  Filled 2019-12-15 (×3): qty 1

## 2019-12-15 MED ORDER — DIPHENHYDRAMINE HCL 50 MG/ML IJ SOLN: 25.0000 mg | Freq: Four times a day (QID) | INTRAMUSCULAR | Status: AC | PRN

## 2019-12-15 MED ORDER — OXYCODONE-ACETAMINOPHEN 5-325 MG PO TABS
2.0000 | ORAL_TABLET | Freq: Once | ORAL | Status: AC
  Administered 2019-12-15: 2 via ORAL
  Filled 2019-12-15: qty 2

## 2019-12-15 MED ORDER — PROMETHAZINE HCL 25 MG/ML IJ SOLN
25.0000 mg | INTRAVENOUS | Status: DC
  Administered 2019-12-15: 11:00:00 25 mg via INTRAVENOUS
  Filled 2019-12-15: qty 1

## 2019-12-15 MED ORDER — DIPHENHYDRAMINE HCL 50 MG/ML IJ SOLN
25.0000 mg | Freq: Four times a day (QID) | INTRAMUSCULAR | Status: DC | PRN
  Administered 2019-12-15: 25 mg via INTRAVENOUS

## 2019-12-15 MED ORDER — DIPHENHYDRAMINE HCL 50 MG/ML IJ SOLN
INTRAMUSCULAR | Status: AC
  Filled 2019-12-15: qty 1

## 2019-12-15 NOTE — MAU Note (Signed)
Pt brought over from short stay, was getting phenergan infusions. Started cramping.

## 2019-12-15 NOTE — Telephone Encounter (Signed)
Orders placed for IV benadryl and Phenergan given. 3x per week x 4 weeks.

## 2019-12-15 NOTE — MAU Note (Signed)
Started cramping yesterday, has continued, denies bleeding or leaking.

## 2019-12-15 NOTE — Significant Event (Signed)
Rapid Response Event Note  Overview: Time Called: 1229 Arrival Time: 1235 Event Type: Other (Comment)(Cramping, [redacted] wks pregnant.) Pt at Medical Day receiving treatment of 25mg  in 1L lactated ringers for hyperemesis.   Initial Focused Assessment: Pt lying in bed. Facial grimacing when cramping occurs. Pt stated the cramps began last night, but have become more frequent and intensity has become worse. Denies bleeding or spotting. BP 100/57.  Interventions: OB Rapid Response RN called and at bedside to assist with transport of pt to MAU.   Plan of Care (if not transferred): MAU.  Event Summary:   at      at    Outcome: Other (Comment)(OB RRRN called, pt taken over to Maternity Assessment Unit)  Event End Time: 1315  

## 2019-12-15 NOTE — Telephone Encounter (Signed)
Patient called today and states that the Benadryl IV is working well with her phenergan IV infusion appointments. Patient is schedule out through 12-19-19 but states that the infusion center told her she does not have any more orders. Patient would like to continue the infusions. Will route to provider.  Armandina Stammer RN

## 2019-12-15 NOTE — Progress Notes (Signed)
Benadryl prn drug reaction

## 2019-12-15 NOTE — MAU Provider Note (Signed)
History     CSN: [id number]  Arrival date and time: 12/15/19 1301   First Provider Initiated Contact with Patient 12/15/19 1414      Chief Complaint  Patient presents with  . Abdominal Pain   HPI  [name] is a 29 y.o. year old [id number] female at 107w1d weeks gestation who was transferred to MAU from the short stay department reporting lower abdominal cramping that has intensified from the moment she arrived for her Phenergan infusion. She reports to MAU provider that she actually started cramping yesterday causing her to have pain and pressure in lower abdomen "making it hard to stand up and walk. Really painful cramps." She denies any vaginal bleeding, abnormal vaginal d/c or LOF. She denies any recent SI. She reports last vaginal swabs were done in MAU (on 11/08/2019). Her pregnancy has been complicated by HG and EIF. She receives [facility]; next appt 12/26/2019. She is requesting "something for pain." She "took Tylenol on 12/14/2019, but it did not work."  Past Medical History:  Diagnosis Date  . Anemia   . Anxiety    takes lexapro  . Asthma    prn inhaler use  . HA (headache)   . Migraine   . Occipital neuralgia   . Sinus tachycardia   . SVT (supraventricular tachycardia) ([facility])     Past Surgical History:  Procedure Laterality Date  . shoulder Right   . TONSILLECTOMY AND ADENOIDECTOMY    . tubes in ears      Family History  Problem Relation Age of Onset  . Hypertension Mother   . Transient ischemic attack Mother   . Diabetes Brother        Type 1  . Breast cancer Maternal Aunt   . Cancer Maternal Aunt     Social History   Tobacco Use  . Smoking status: Never Smoker  . Smokeless tobacco: Never Used  Substance Use Topics  . Alcohol use: Not Currently  . Drug use: Never    Allergies:  Allergies  Allergen Reactions  . Nitrous Oxide Anaphylaxis  . Peach Flavor Anaphylaxis  . Shellfish Allergy Anaphylaxis  . Adhesive [Tape] Hives, Itching and  Other (See Comments)    blisters    Facility-Administered Medications Prior to Admission  Medication Dose Route Frequency Provider Last Rate Last Admin  . diphenhydrAMINE (BENADRYL) injection 25 mg  25 mg Intravenous Q6H PRN Adam Phenix, MD       Medications Prior to Admission  Medication Sig Dispense Refill Last Dose  . propranolol (INDERAL) 10 MG tablet Take 10 mg by mouth 2 (two) times daily.     . Butalbital-APAP-Caffeine 50-325-40 MG capsule Take 1-2 capsules by mouth every 6 (six) hours as needed for headache. 30 capsule 3   . cyclobenzaprine (FLEXERIL) 10 MG tablet Take 1 tablet (10 mg total) by mouth 3 (three) times daily as needed for muscle spasms. 40 tablet 3   . Elastic Bandages & Supports (COMFORT FIT MATERNITY SUPP SM) MISC 1 Units by Does not apply route daily as needed. 1 each 0   . escitalopram (LEXAPRO) 10 MG tablet Take 1 tablet (10 mg total) by mouth daily. 30 tablet 3   . famotidine (PEPCID) 20 MG tablet Take 1 tablet (20 mg total) by mouth 2 (two) times daily. 60 tablet 3   . ferrous sulfate 325 (65 FE) MG tablet Take 325 mg by mouth 3 (three) times daily.     Marland Kitchen labetalol (NORMODYNE) 100  MG tablet Take 2 tablets (200 mg total) by mouth 2 (two) times daily. (Patient not taking: Reported on 12/12/2019) 360 tablet 0   . Magnesium 400 MG TABS Take 400 mg by mouth daily. (Patient not taking: Reported on 10/22/2019) 30 tablet 0   . metoCLOPramide (REGLAN) 10 MG tablet Take 1 tablet (10 mg total) by mouth 3 (three) times daily before meals. (Patient not taking: Reported on 11/19/2019) 30 tablet 2   . ondansetron (ZOFRAN) 8 MG tablet Take 1 tablet (8 mg total) by mouth every 8 (eight) hours as needed for nausea or vomiting. 20 tablet 2   . Prenatal Vit-Fe Fumarate-FA (PRENATAL MULTIVITAMIN) TABS tablet Take 1 tablet by mouth daily at 12 noon.     . promethazine (PHENERGAN) 25 MG suppository Place 1 suppository (25 mg total) rectally every 6 (six) hours as needed for nausea or  vomiting. (Patient not taking: Reported on 12/12/2019) 60 each 5   . zolpidem (AMBIEN) 5 MG tablet Take 1 tablet (5 mg total) by mouth at bedtime as needed for sleep. (Patient not taking: Reported on 11/19/2019) 10 tablet 1   . zolpidem (AMBIEN) 5 MG tablet Take 1 tablet (5 mg total) by mouth at bedtime as needed for sleep. 30 tablet 1     Review of Systems  Constitutional: Negative.   HENT: Negative.   Eyes: Negative.   Respiratory: Negative.   Cardiovascular: Negative.   Gastrointestinal: Negative.   Endocrine: Negative.   Genitourinary: Positive for pelvic pain (cramping that is increasing; pressure).  Musculoskeletal: Negative.   Skin: Negative.   Allergic/Immunologic: Negative.   Neurological: Negative.   Hematological: Negative.   Psychiatric/Behavioral: Negative.    Physical Exam   Blood pressure (!) 109/56, pulse 93, temperature 98 F (36.7 C), temperature source Oral, resp. rate 18, last menstrual period 06/01/2019, SpO2 96 %.  Physical Exam  Nursing note and vitals reviewed. Constitutional: She is oriented to person, place, and time. She appears well-developed and well-nourished.  HENT:  Head: Normocephalic and atraumatic.  Eyes: Pupils are equal, round, and reactive to light.  Cardiovascular: Normal rate.  Respiratory: Effort normal.  GI: Soft.  Genitourinary:    Genitourinary Comments: Uterus: gravid, S=D, SE: cervix is smooth, pink, no lesions, moderate amt of thick, white vaginal d/c -- WP, GC/CT done, closed/long/firm, no CMT or friability, no adnexal tenderness    Musculoskeletal:        General: Normal range of motion.     Cervical back: Normal range of motion.  Neurological: She is alert and oriented to person, place, and time.  Skin: Skin is warm and dry.  Psychiatric: She has a normal mood and affect. Her behavior is normal. Judgment and thought content normal.   NST - FHR: 145 bpm / moderate variability / accels present / decels absent / TOCO: Occ UI and  UC  MAU Course  Procedures  MDM CCUA Wet Prep GC/CT -- Results pending  Procardia 10 mg every 20 mins x 3 doses Percocet 5/325 mg x 2 tablets -- no change in pain, patient resting on LT side upon reassessment at 1720  Results for orders placed or performed during the hospital encounter of 12/15/19 (from the past 24 hour(s))  Wet prep, genital     Status: Abnormal   Collection Time: 12/15/19  2:29 PM   Specimen: Vaginal  Result Value Ref Range   Yeast Wet Prep HPF POC NONE SEEN NONE SEEN   Trich, Wet Prep NONE SEEN NONE SEEN  Clue Cells Wet Prep HPF POC NONE SEEN NONE SEEN   WBC, Wet Prep HPF POC MODERATE (A) NONE SEEN   Sperm NONE SEEN   Fetal fibronectin     Status: None   Collection Time: 12/15/19  2:29 PM  Result Value Ref Range   Fetal Fibronectin NEGATIVE NEGATIVE     Assessment and Plan  Pelvic pain affecting pregnancy in third trimester, antepartum - Plan: Discharge patient - Reassurance given that cervix is not dilated and there are no UC's tracing on monitor - Advised to stay well-hydrated and rest as much as possible - Information provided on preventing preterm birth, PTL & PTB - Keep scheduled appt with [facility]  - Patient verbalized an understanding of the plan of care and agrees.    Laury Deep, MSN, CNM 12/15/2019, 2:30 PM

## 2019-12-15 NOTE — Progress Notes (Signed)
Pt here for LR infusion w/ phenergan.  Also received 25mg  IV benadryl during infusion.  At end of infusion, she stated that she was having low abdominal cramps that had started since her arrival and have increased in severity.  Pt placed on left side, baby's heartbeat 138-143.  OB rapid response called and came to floor.  Pt was advised to go to MAU to be checked out.  She agreed and was taken by wheelchair by OB rapid response.

## 2019-12-16 LAB — GC/CHLAMYDIA PROBE AMP (~~LOC~~) NOT AT ARMC
Chlamydia: NEGATIVE
Comment: NEGATIVE
Comment: NORMAL
Neisseria Gonorrhea: NEGATIVE

## 2019-12-17 ENCOUNTER — Ambulatory Visit (HOSPITAL_COMMUNITY)
Admission: RE | Admit: 2019-12-17 | Discharge: 2019-12-17 | Disposition: A | Discharge status: Home / Self Care | Payer: 59 | Source: Ambulatory Visit | Attending: Family Medicine | Admitting: Family Medicine

## 2019-12-17 ENCOUNTER — Other Ambulatory Visit: Payer: Self-pay

## 2019-12-17 DIAGNOSIS — O210 Mild hyperemesis gravidarum: Secondary | ICD-10-CM | POA: Insufficient documentation

## 2019-12-17 DIAGNOSIS — Z3A00 Weeks of gestation of pregnancy not specified: Secondary | ICD-10-CM | POA: Diagnosis not present

## 2019-12-17 MED ORDER — DIPHENHYDRAMINE HCL 50 MG/ML IJ SOLN: 25.0000 mg | INTRAMUSCULAR | Status: DC

## 2019-12-17 MED ORDER — DIPHENHYDRAMINE HCL 50 MG/ML IJ SOLN
INTRAMUSCULAR | Status: AC
  Administered 2019-12-17: 25 mg
  Filled 2019-12-17: qty 1

## 2019-12-17 MED ORDER — PROMETHAZINE HCL 25 MG/ML IJ SOLN
25.0000 mg | INTRAVENOUS | Status: DC
  Administered 2019-12-17: 25 mg via INTRAVENOUS
  Filled 2019-12-17: qty 1

## 2019-12-19 ENCOUNTER — Encounter (HOSPITAL_COMMUNITY)
Admission: RE | Admit: 2019-12-19 | Discharge: 2019-12-19 | Disposition: A | Discharge status: Home / Self Care | Payer: 59 | Source: Ambulatory Visit | Attending: Family Medicine | Admitting: Family Medicine

## 2019-12-19 ENCOUNTER — Other Ambulatory Visit: Payer: Self-pay

## 2019-12-19 DIAGNOSIS — O210 Mild hyperemesis gravidarum: Secondary | ICD-10-CM | POA: Diagnosis not present

## 2019-12-19 MED ORDER — DIPHENHYDRAMINE HCL 50 MG/ML IJ SOLN
INTRAMUSCULAR | Status: AC
  Filled 2019-12-19: qty 1

## 2019-12-19 MED ORDER — DIPHENHYDRAMINE HCL 50 MG/ML IJ SOLN
25.0000 mg | INTRAMUSCULAR | Status: DC
  Administered 2019-12-19: 10:00:00 25 mg via INTRAVENOUS

## 2019-12-19 MED ORDER — PROMETHAZINE HCL 25 MG/ML IJ SOLN
25.0000 mg | INTRAVENOUS | Status: DC
  Administered 2019-12-19: 25 mg via INTRAVENOUS
  Filled 2019-12-19: qty 1

## 2019-12-22 ENCOUNTER — Other Ambulatory Visit: Payer: Self-pay

## 2019-12-22 ENCOUNTER — Encounter (HOSPITAL_COMMUNITY)
Admission: RE | Admit: 2019-12-22 | Discharge: 2019-12-22 | Disposition: A | Discharge status: Home / Self Care | Payer: 59 | Source: Ambulatory Visit | Attending: Family Medicine | Admitting: Family Medicine

## 2019-12-22 DIAGNOSIS — O210 Mild hyperemesis gravidarum: Secondary | ICD-10-CM | POA: Diagnosis not present

## 2019-12-22 MED ORDER — DIPHENHYDRAMINE HCL 50 MG/ML IJ SOLN
INTRAMUSCULAR | Status: AC
  Filled 2019-12-22: qty 1

## 2019-12-22 MED ORDER — PROMETHAZINE HCL 25 MG/ML IJ SOLN
25.0000 mg | INTRAVENOUS | Status: DC
  Administered 2019-12-22: 25 mg via INTRAVENOUS
  Filled 2019-12-22: qty 1

## 2019-12-22 MED ORDER — DIPHENHYDRAMINE HCL 50 MG/ML IJ SOLN
25.0000 mg | INTRAMUSCULAR | Status: DC
  Administered 2019-12-22: 25 mg via INTRAVENOUS

## 2019-12-24 ENCOUNTER — Inpatient Hospital Stay (HOSPITAL_COMMUNITY)
Admission: AD | Admit: 2019-12-24 | Discharge: 2019-12-24 | Disposition: A | Discharge status: Home / Self Care | Payer: 59 | Attending: Obstetrics and Gynecology | Admitting: Obstetrics and Gynecology

## 2019-12-24 ENCOUNTER — Other Ambulatory Visit: Payer: Self-pay

## 2019-12-24 ENCOUNTER — Encounter (HOSPITAL_COMMUNITY)
Admission: RE | Admit: 2019-12-24 | Discharge: 2019-12-24 | Disposition: A | Discharge status: Home / Self Care | Payer: 59 | Source: Ambulatory Visit | Attending: Family Medicine | Admitting: Family Medicine

## 2019-12-24 ENCOUNTER — Encounter (HOSPITAL_COMMUNITY): Payer: Self-pay | Admitting: Obstetrics and Gynecology

## 2019-12-24 ENCOUNTER — Inpatient Hospital Stay (HOSPITAL_COMMUNITY): Payer: 59

## 2019-12-24 DIAGNOSIS — R102 Pelvic and perineal pain: Secondary | ICD-10-CM

## 2019-12-24 DIAGNOSIS — O219 Vomiting of pregnancy, unspecified: Secondary | ICD-10-CM | POA: Diagnosis not present

## 2019-12-24 DIAGNOSIS — O210 Mild hyperemesis gravidarum: Secondary | ICD-10-CM | POA: Diagnosis not present

## 2019-12-24 DIAGNOSIS — F419 Anxiety disorder, unspecified: Secondary | ICD-10-CM | POA: Diagnosis not present

## 2019-12-24 DIAGNOSIS — Z79899 Other long term (current) drug therapy: Secondary | ICD-10-CM | POA: Diagnosis not present

## 2019-12-24 DIAGNOSIS — R1011 Right upper quadrant pain: Secondary | ICD-10-CM | POA: Insufficient documentation

## 2019-12-24 DIAGNOSIS — Z3A29 29 weeks gestation of pregnancy: Secondary | ICD-10-CM | POA: Insufficient documentation

## 2019-12-24 DIAGNOSIS — O99513 Diseases of the respiratory system complicating pregnancy, third trimester: Secondary | ICD-10-CM | POA: Diagnosis not present

## 2019-12-24 DIAGNOSIS — O26893 Other specified pregnancy related conditions, third trimester: Secondary | ICD-10-CM | POA: Diagnosis present

## 2019-12-24 DIAGNOSIS — Z3400 Encounter for supervision of normal first pregnancy, unspecified trimester: Secondary | ICD-10-CM

## 2019-12-24 DIAGNOSIS — K824 Cholesterolosis of gallbladder: Secondary | ICD-10-CM

## 2019-12-24 DIAGNOSIS — O99343 Other mental disorders complicating pregnancy, third trimester: Secondary | ICD-10-CM | POA: Insufficient documentation

## 2019-12-24 DIAGNOSIS — Z3689 Encounter for other specified antenatal screening: Secondary | ICD-10-CM

## 2019-12-24 DIAGNOSIS — Z3A30 30 weeks gestation of pregnancy: Secondary | ICD-10-CM

## 2019-12-24 DIAGNOSIS — J45909 Unspecified asthma, uncomplicated: Secondary | ICD-10-CM | POA: Diagnosis not present

## 2019-12-24 LAB — CBC
HCT: 29.1 % — ABNORMAL LOW (ref 36.0–46.0)
Hemoglobin: 9.3 g/dL — ABNORMAL LOW (ref 12.0–15.0)
MCH: 27.6 pg (ref 26.0–34.0)
MCHC: 32 g/dL (ref 30.0–36.0)
MCV: 86.4 fL (ref 80.0–100.0)
Platelets: 263 10*3/uL (ref 150–400)
RBC: 3.37 MIL/uL — ABNORMAL LOW (ref 3.87–5.11)
RDW: 11.9 % (ref 11.5–15.5)
WBC: 12.6 10*3/uL — ABNORMAL HIGH (ref 4.0–10.5)
nRBC: 0 % (ref 0.0–0.2)

## 2019-12-24 LAB — COMPREHENSIVE METABOLIC PANEL
ALT: 11 U/L (ref 0–44)
AST: 13 U/L — ABNORMAL LOW (ref 15–41)
Albumin: 2.4 g/dL — ABNORMAL LOW (ref 3.5–5.0)
Alkaline Phosphatase: 70 U/L (ref 38–126)
Anion gap: 9 (ref 5–15)
BUN: <5 mg/dL — ABNORMAL LOW (ref 6–20)
CO2: 20 mmol/L — ABNORMAL LOW (ref 22–32)
Calcium: 8.5 mg/dL — ABNORMAL LOW (ref 8.9–10.3)
Chloride: 110 mmol/L (ref 98–111)
Creatinine, Ser: 0.57 mg/dL (ref 0.44–1.00)
GFR calc Af Amer: >60 mL/min (ref >60)
GFR calc non Af Amer: >60 mL/min (ref >60)
Glucose, Bld: 92 mg/dL (ref 70–99)
Potassium: 3.9 mmol/L (ref 3.5–5.1)
Sodium: 139 mmol/L (ref 135–145)
Total Bilirubin: 0.5 mg/dL (ref 0.3–1.2)
Total Protein: 5.2 g/dL — ABNORMAL LOW (ref 6.5–8.1)

## 2019-12-24 LAB — URINALYSIS, ROUTINE W REFLEX MICROSCOPIC
Bilirubin Urine: NEGATIVE
Glucose, UA: NEGATIVE mg/dL
Hgb urine dipstick: NEGATIVE
Ketones, ur: 20 mg/dL — AB
Leukocytes,Ua: NEGATIVE
Nitrite: NEGATIVE
Protein, ur: NEGATIVE mg/dL
Specific Gravity, Urine: 1.004 — ABNORMAL LOW (ref 1.005–1.030)
pH: 9 — ABNORMAL HIGH (ref 5.0–8.0)

## 2019-12-24 LAB — RAPID URINE DRUG SCREEN, HOSP PERFORMED
Amphetamines: NOT DETECTED
Barbiturates: NOT DETECTED
Benzodiazepines: NOT DETECTED
Cocaine: NOT DETECTED
Opiates: NOT DETECTED
Tetrahydrocannabinol: NOT DETECTED

## 2019-12-24 MED ORDER — DIPHENHYDRAMINE HCL 50 MG/ML IJ SOLN
INTRAMUSCULAR | Status: AC
  Administered 2019-12-24: 25 mg
  Filled 2019-12-24: qty 1

## 2019-12-24 MED ORDER — METOCLOPRAMIDE HCL 5 MG/ML IJ SOLN
10.0000 mg | Freq: Once | INTRAMUSCULAR | Status: AC
  Administered 2019-12-24: 14:00:00 10 mg via INTRAVENOUS
  Filled 2019-12-24: qty 2

## 2019-12-24 MED ORDER — DIPHENHYDRAMINE HCL 50 MG/ML IJ SOLN
INTRAMUSCULAR | Status: AC
  Filled 2019-12-24: qty 1

## 2019-12-24 MED ORDER — PROMETHAZINE HCL 25 MG/ML IJ SOLN
25.0000 mg | INTRAVENOUS | Status: DC
  Administered 2019-12-24: 25 mg via INTRAVENOUS
  Filled 2019-12-24: qty 1

## 2019-12-24 MED ORDER — DIPHENHYDRAMINE HCL 50 MG/ML IJ SOLN: 25.0000 mg | INTRAMUSCULAR | Status: DC

## 2019-12-24 MED ORDER — FAMOTIDINE IN NACL 20-0.9 MG/50ML-% IV SOLN
INTRAVENOUS | Status: AC
  Administered 2019-12-24: 20 mg
  Filled 2019-12-24: qty 50

## 2019-12-24 MED ORDER — FAMOTIDINE IN NACL 20-0.9 MG/50ML-% IV SOLN: 20.0000 mg | Freq: Once | INTRAVENOUS | Status: DC

## 2019-12-24 MED ORDER — MORPHINE SULFATE (PF) 4 MG/ML IV SOLN
2.0000 mg | Freq: Once | INTRAVENOUS | Status: AC
  Administered 2019-12-24: 14:00:00 2 mg via INTRAVENOUS
  Filled 2019-12-24: qty 1

## 2019-12-24 MED ORDER — DIPHENHYDRAMINE HCL 50 MG/ML IJ SOLN
25.0000 mg | Freq: Once | INTRAMUSCULAR | Status: AC
  Administered 2019-12-24: 15:00:00 25 mg via INTRAVENOUS

## 2019-12-24 NOTE — MAU Provider Note (Signed)
History     CSN: [id number]  Arrival date and time: 12/24/19 1222   First Provider Initiated Contact with Patient 12/24/19 1333      Chief Complaint  Patient presents with  . Abdominal Pain  . Nausea  . Emesis   Michelle Vincent is a 29 y.o. G1P0 at [redacted]w[redacted]d who receives care at [facility].  She presents today for Abdominal Pain, Nausea, and Emesis. She states she woke up around 830 and started having vomiting with blood 3x.  However, she was still able to get her infusion, but did not feel like her nausea was improved. She states she has thrown up 3 additional times since her initial incident.  She states her current vomit is "like stomach bile, it's just yellow." Patient reports she gets a LR (1L), benadryl (25mg ), and phenergan (25mg ) infusion on MWF and she also received pepcid today due to the pain and vomiting of blood.  She also takes ambien, lexapro, iron, propanolol, and PNV.  She states she also takes zofran prn, but uses it "for light nausea."  Patient endorses current nausea and fetal movement.  She also endorses abdominal pain in the right upper quadrant that started last night that improved with tylenol and heating pad enough to go to sleep. She states she woke up this morning and was "in a lot of pain."  Patient rates the pain a 9/10 and describes it as a constant sharp stabbing pain.  She states she has not been able to eat or drink today.  She states she attempted to drink some "sprite, but couldn't keep it down longer than a minute."  She states she had mashed potatoes, corn, and fried chicken last night for dinner and was able to keep that down without issues.  She states the abdominal pain was present before eating and did not worsen with eating.  She states she had a ham sandwich and an orange for lunch yesterday.  Of note, patient has had no incidents of emesis or dry-heaving while provider is in the room.        OB History    Gravida  1   Para  0   Term  0    Preterm  0   AB  0   Living  0     SAB  0   TAB  0   Ectopic  0   Multiple  0   Live Births  0           Past Medical History:  Diagnosis Date  . Anemia   . Anxiety    takes lexapro  . Asthma    prn inhaler use  . HA (headache)   . Migraine   . Occipital neuralgia   . Sinus tachycardia   . SVT (supraventricular tachycardia) (HCC)     Past Surgical History:  Procedure Laterality Date  . shoulder Right   . TONSILLECTOMY AND ADENOIDECTOMY    . tubes in ears      Family History  Problem Relation Age of Onset  . Hypertension Mother   . Transient ischemic attack Mother   . Diabetes Brother        Type 1  . Breast cancer Maternal Aunt   . Cancer Maternal Aunt     Social History   Tobacco Use  . Smoking status: Never Smoker  . Smokeless tobacco: Never Used  Substance Use Topics  . Alcohol use: Not Currently  . Drug use: Never  Allergies:  Allergies  Allergen Reactions  . Nitrous Oxide Anaphylaxis  . Peach Flavor Anaphylaxis  . Shellfish Allergy Anaphylaxis  . Adhesive [Tape] Hives, Itching and Other (See Comments)    blisters    Facility-Administered Medications Prior to Admission  Medication Dose Route Frequency Provider Last Rate Last Admin  . diphenhydrAMINE (BENADRYL) injection 25 mg  25 mg Intravenous Q6H PRN Adam Phenix, MD       Medications Prior to Admission  Medication Sig Dispense Refill Last Dose  . Butalbital-APAP-Caffeine 50-325-40 MG capsule Take 1-2 capsules by mouth every 6 (six) hours as needed for headache. 30 capsule 3 Past Week at Unknown time  . cyclobenzaprine (FLEXERIL) 10 MG tablet Take 1 tablet (10 mg total) by mouth 3 (three) times daily as needed for muscle spasms. 40 tablet 3 12/23/2019 at Unknown time  . Elastic Bandages & Supports (COMFORT FIT MATERNITY SUPP SM) MISC 1 Units by Does not apply route daily as needed. 1 each 0 12/23/2019 at Unknown time  . escitalopram (LEXAPRO) 10 MG tablet Take 1 tablet (10 mg  total) by mouth daily. 30 tablet 3 12/23/2019 at Unknown time  . famotidine (PEPCID) 20 MG tablet Take 1 tablet (20 mg total) by mouth 2 (two) times daily. 60 tablet 3 12/23/2019 at Unknown time  . ferrous sulfate 325 (65 FE) MG tablet Take 325 mg by mouth 3 (three) times daily.   12/23/2019 at Unknown time  . ondansetron (ZOFRAN) 8 MG tablet Take 1 tablet (8 mg total) by mouth every 8 (eight) hours as needed for nausea or vomiting. 20 tablet 2 12/23/2019 at Unknown time  . Prenatal Vit-Fe Fumarate-FA (PRENATAL MULTIVITAMIN) TABS tablet Take 1 tablet by mouth daily at 12 noon.   12/23/2019 at Unknown time  . propranolol (INDERAL) 10 MG tablet Take 10 mg by mouth 2 (two) times daily.   12/23/2019 at Unknown time  . zolpidem (AMBIEN) 5 MG tablet Take 1 tablet (5 mg total) by mouth at bedtime as needed for sleep. 30 tablet 1 12/23/2019 at Unknown time  . labetalol (NORMODYNE) 100 MG tablet Take 2 tablets (200 mg total) by mouth 2 (two) times daily. (Patient not taking: Reported on 12/12/2019) 360 tablet 0   . Magnesium 400 MG TABS Take 400 mg by mouth daily. (Patient not taking: Reported on 10/22/2019) 30 tablet 0   . metoCLOPramide (REGLAN) 10 MG tablet Take 1 tablet (10 mg total) by mouth 3 (three) times daily before meals. (Patient not taking: Reported on 11/19/2019) 30 tablet 2   . promethazine (PHENERGAN) 25 MG suppository Place 1 suppository (25 mg total) rectally every 6 (six) hours as needed for nausea or vomiting. (Patient not taking: Reported on 12/12/2019) 60 each 5   . zolpidem (AMBIEN) 5 MG tablet Take 1 tablet (5 mg total) by mouth at bedtime as needed for sleep. (Patient not taking: Reported on 11/19/2019) 10 tablet 1     Review of Systems  Constitutional: Negative for chills and fever.  Eyes: Negative for visual disturbance.  Respiratory: Negative for cough and shortness of breath.   Gastrointestinal: Positive for abdominal pain, nausea and vomiting. Negative for constipation and diarrhea.   Genitourinary: Negative for difficulty urinating, dysuria and pelvic pain.  Musculoskeletal: Negative for back pain.  Neurological: Positive for headaches (6/10). Negative for dizziness and light-headedness.   Physical Exam   Blood pressure 131/79, pulse (!) 115, temperature 98.4 F (36.9 C), temperature source Oral, resp. rate 20, height 5\' 7"  (1.702 m),  weight 85.6 kg, last menstrual period 06/01/2019, SpO2 100 %.  Physical Exam  Constitutional: She is oriented to person, place, and time. She appears well-developed and well-nourished.  HENT:  Head: Normocephalic and atraumatic.  Eyes: Conjunctivae are normal.  Cardiovascular: Normal rate, regular rhythm and normal heart sounds.  Respiratory: Effort normal and breath sounds normal.  GI: Soft. There is abdominal tenderness in the right upper quadrant. There is guarding. There is no rebound.  Musculoskeletal:        General: Normal range of motion.     Cervical back: Normal range of motion.  Neurological: She is alert and oriented to person, place, and time.  Skin: Skin is warm and dry.  Psychiatric: She has a normal mood and affect. Her behavior is normal.    Fetal Assessment 140 bpm, Mod Var, -Decels, +10x10 Accels Toco: None graphed  MAU Course   Results for orders placed or performed during the hospital encounter of 12/24/19 (from the past 24 hour(s))  Urinalysis, Routine w reflex microscopic     Status: Abnormal   Collection Time: 12/24/19  1:18 PM  Result Value Ref Range   Color, Urine COLORLESS (A) YELLOW   APPearance CLEAR CLEAR   Specific Gravity, Urine 1.004 (L) 1.005 - 1.030   pH 9.0 (H) 5.0 - 8.0   Glucose, UA NEGATIVE NEGATIVE mg/dL   Hgb urine dipstick NEGATIVE NEGATIVE   Bilirubin Urine NEGATIVE NEGATIVE   Ketones, ur 20 (A) NEGATIVE mg/dL   Protein, ur NEGATIVE NEGATIVE mg/dL   Nitrite NEGATIVE NEGATIVE   Leukocytes,Ua NEGATIVE NEGATIVE  CBC     Status: Abnormal   Collection Time: 12/24/19  2:08 PM   Result Value Ref Range   WBC 12.6 (H) 4.0 - 10.5 K/uL   RBC 3.37 (L) 3.87 - 5.11 MIL/uL   Hemoglobin 9.3 (L) 12.0 - 15.0 g/dL   HCT 96.2 (L) 83.6 - 62.9 %   MCV 86.4 80.0 - 100.0 fL   MCH 27.6 26.0 - 34.0 pg   MCHC 32.0 30.0 - 36.0 g/dL   RDW 47.6 54.6 - 50.3 %   Platelets 263 150 - 400 K/uL   nRBC 0.0 0.0 - 0.2 %  Comprehensive metabolic panel     Status: Abnormal   Collection Time: 12/24/19  2:08 PM  Result Value Ref Range   Sodium 139 135 - 145 mmol/L   Potassium 3.9 3.5 - 5.1 mmol/L   Chloride 110 98 - 111 mmol/L   CO2 20 (L) 22 - 32 mmol/L   Glucose, Bld 92 70 - 99 mg/dL   BUN <5 (L) 6 - 20 mg/dL   Creatinine, Ser 5.46 0.44 - 1.00 mg/dL   Calcium 8.5 (L) 8.9 - 10.3 mg/dL   Total Protein 5.2 (L) 6.5 - 8.1 g/dL   Albumin 2.4 (L) 3.5 - 5.0 g/dL   AST 13 (L) 15 - 41 U/L   ALT 11 0 - 44 U/L   Alkaline Phosphatase 70 38 - 126 U/L   Total Bilirubin 0.5 0.3 - 1.2 mg/dL   GFR calc non Af Amer >60 >60 mL/min   GFR calc Af Amer >60 >60 mL/min   Anion gap 9 5 - 15   US Abdomen Limited  Result Date: 12/24/2019 CLINICAL DATA:  Right upper quadrant pain. Hyperemesis. Third trimester gestation EXAM: ULTRASOUND ABDOMEN LIMITED RIGHT UPPER QUADRANT COMPARISON:  None. FINDINGS: Gallbladder: Within the gallbladder, there is a 5 mm echogenic focus which neither moves nor shadows, a presumed polyp. There  are no echogenic foci which move and shadow as is expected with gallstones. No gallbladder wall thickening or pericholecystic fluid. Patient is tender over the right upper quadrant. Common bile duct: Diameter: 5 mm. No intrahepatic or extrahepatic biliary duct dilatation. Liver: No focal lesion identified. Within normal limits in parenchymal echogenicity. Portal vein is patent on color Doppler imaging with normal direction of blood flow towards the liver. Other: None. IMPRESSION: 1. 5 mm apparent polyp within the gallbladder. Per consensus guidelines, a polyp of this size does not warrant  additional imaging surveillance. There are no evident gallstones, gallbladder wall thickening, or pericholecystic fluid. Patient is tender in the right upper quadrant region. The significance of this finding in light of other findings is uncertain. This finding warrants close clinical surveillance. 2.  Study otherwise unremarkable. Electronically Signed   By: Lowella Grip III M.D.   On: 12/24/2019 15:39    MDM PE Labs: UA, UDS EFM Pain Medication  AntiEmetic Assessment and Plan  29 year old G1P0  SIUP at 29.3weeks Cat I FT Hyperemesis   -POC reviewed. -Patient requests medications for nausea.  -Exam findings discussed. -Will give reglan 10mg  IV. -Will give morphine 2mg . -Will send for ultrasound to evaluate abdominal pain.   Maryann Conners MSN, CNM 12/24/2019, 1:33 PM   Reassessment (2:14 PM) -Patient received IV morphine for pain and reports some itching.   -Benadryl 25mg  ordered.  Reassessment (3:39 PM) -[place] results pending. -Labs as above. -NST Reactive and okay to d/c EFM.  Reassessment (4:14 PM) Gallbladder of Polyp  -[place] returns as above -Results discussed with patient.   -She was informed that findings should not cause RUQ pain. -Patient reports pain has improved with pain medication.  Patient notably laying on right side. -Discussed need to keep scheduled appt for Friday for follow up regarding pain and routine PNC. -Patient verbalized understanding and without questions or concerns. -Encouraged to call or return to MAU if symptoms worsen or with the onset of new symptoms. -Discharged to home in improved condition.   Maryann Conners MSN, CNM Advanced Practice Provider, [facility]

## 2019-12-24 NOTE — Discharge Instructions (Signed)
Hyperemesis Gravidarum Hyperemesis gravidarum is a severe form of nausea and vomiting that happens during pregnancy. Hyperemesis is worse than morning sickness. It may cause you to have nausea or vomiting all day for many days. It may keep you from eating and drinking enough food and liquids, which can lead to dehydration, malnutrition, and weight loss. Hyperemesis usually occurs during the first half (the first 20 weeks) of pregnancy. It often goes away once a woman is in her second half of pregnancy. However, sometimes hyperemesis continues through an entire pregnancy. What are the causes? The cause of this condition is not known. It may be related to changes in chemicals (hormones) in the body during pregnancy, such as the high level of pregnancy hormone (human chorionic gonadotropin) or the increase in the female sex hormone (estrogen). What are the signs or symptoms? Symptoms of this condition include:  Nausea that does not go away.  Vomiting that does not allow you to keep any food down.  Weight loss.  Body fluid loss (dehydration).  Having no desire to eat, or not liking food that you have previously enjoyed. How is this diagnosed? This condition may be diagnosed based on:  A physical exam.  Your medical history.  Your symptoms.  Blood tests.  Urine tests. How is this treated? This condition is managed by controlling symptoms. This may include:  Following an eating plan. This can help lessen nausea and vomiting.  Taking prescription medicines. An eating plan and medicines are often used together to help control symptoms. If medicines do not help relieve nausea and vomiting, you may need to receive fluids through an IV at the hospital. Follow these instructions at home: Eating and drinking   Avoid the following: ? Drinking fluids with meals. Try not to drink anything during the 30 minutes before and after your meals. ? Drinking more than 1 cup of fluid at a  time. ? Eating foods that trigger your symptoms. These may include spicy foods, coffee, high-fat foods, very sweet foods, and acidic foods. ? Skipping meals. Nausea can be more intense on an empty stomach. If you cannot tolerate food, do not force it. Try sucking on ice chips or other frozen items and make up for missed calories later. ? Lying down within 2 hours after eating. ? Being exposed to environmental triggers. These may include food smells, smoky rooms, closed spaces, rooms with strong smells, warm or humid places, overly loud and noisy rooms, and rooms with motion or flickering lights. Try eating meals in a well-ventilated area that is free of strong smells. ? Quick and sudden changes in your movement. ? Taking iron pills and multivitamins that contain iron. If you take prescription iron pills, do not stop taking them unless your health care provider approves. ? Preparing food. The smell of food can spoil your appetite or trigger nausea.  To help relieve your symptoms: ? Listen to your body. Everyone is different and has different preferences. Find what works best for you. ? Eat and drink slowly. ? Eat 5-6 small meals daily instead of 3 large meals. Eating small meals and snacks can help you avoid an empty stomach. ? In the morning, before getting out of bed, eat a couple of crackers to avoid moving around on an empty stomach. ? Try eating starchy foods as these are usually tolerated well. Examples include cereal, toast, bread, potatoes, pasta, rice, and pretzels. ? Include at least 1 serving of protein with your meals and snacks. Protein options include   lean meats, poultry, seafood, beans, nuts, nut butters, eggs, cheese, and yogurt. ? Try eating a protein-rich snack before bed. Examples of a protein-rick snack include cheese and crackers or a peanut butter sandwich made with 1 slice of whole-wheat bread and 1 tsp (5 g) of peanut butter. ? Eat or suck on things that have ginger in them.  It may help relieve nausea. Add  tsp ground ginger to hot tea or choose ginger tea. ? Try drinking 100% fruit juice or an electrolyte drink. An electrolyte drink contains sodium, potassium, and chloride. ? Drink fluids that are cold, clear, and carbonated or sour. Examples include lemonade, ginger ale, lemon-lime soda, ice water, and sparkling water. ? Brush your teeth or use a mouth rinse after meals. ? Talk with your health care provider about starting a supplement of vitamin B6. General instructions  Take over-the-counter and prescription medicines only as told by your health care provider.  Follow instructions from your health care provider about eating or drinking restrictions.  Continue to take your prenatal vitamins as told by your health care provider. If you are having trouble taking your prenatal vitamins, talk with your health care provider about different options.  Keep all follow-up and pre-birth (prenatal) visits as told by your health care provider. This is important. Contact a health care provider if:  You have pain in your abdomen.  You have a severe headache.  You have vision problems.  You are losing weight.  You feel weak or dizzy. Get help right away if:  You cannot drink fluids without vomiting.  You vomit blood.  You have constant nausea and vomiting.  You are very weak.  You faint.  You have a fever and your symptoms suddenly get worse. Summary  Hyperemesis gravidarum is a severe form of nausea and vomiting that happens during pregnancy.  Making some changes to your eating habits may help relieve nausea and vomiting.  This condition may be managed with medicine.  If medicines do not help relieve nausea and vomiting, you may need to receive fluids through an IV at the hospital. This information is not intended to replace advice given to you by your health care provider. Make sure you discuss any questions you have with your health care  provider. Document Revised: 11/12/2017 Document Reviewed: 06/21/2016 Elsevier Patient Education  2020 Elsevier Inc.  

## 2019-12-24 NOTE — MAU Note (Signed)
Received new order to d/c EFM per Gerrit Heck, CNM.

## 2019-12-24 NOTE — MAU Note (Signed)
[facility] to [facility] for c/o RUQ pain, reports pain constant, sharp & stabbing.  Pt states also has continued N&V despite IVF's & meds received @ [facility].  Denies VB or LOF.  Reports +FM.

## 2019-12-24 NOTE — Progress Notes (Signed)
Pt continues to c/o of nausea and has some right upper quadrant pain.  Requesting to go to Maternity Admissions.  Pt transferred to MAU via wheelchair by RN

## 2019-12-24 NOTE — Progress Notes (Signed)
Pt here today for IVF/phenergan.  States she has been vomiting this morning and has had blood in it.  Emesis noted to be mostly clear but blood tinged.  Dr Vergie Living called (Dr Adrian Blackwater not available d/t being post call) and orders received for one time IV Pepcid dose and he also instructed pt to increase daily home dose of PO Pepcid to 40mg  BID.  Instructions given to patient

## 2019-12-25 ENCOUNTER — Encounter: Payer: Self-pay | Admitting: Obstetrics and Gynecology

## 2019-12-25 DIAGNOSIS — K824 Cholesterolosis of gallbladder: Secondary | ICD-10-CM | POA: Insufficient documentation

## 2019-12-26 ENCOUNTER — Encounter (HOSPITAL_COMMUNITY)
Admission: RE | Admit: 2019-12-26 | Discharge: 2019-12-26 | Disposition: A | Discharge status: Home / Self Care | Payer: 59 | Source: Ambulatory Visit | Attending: Family Medicine | Admitting: Family Medicine

## 2019-12-26 ENCOUNTER — Other Ambulatory Visit: Payer: Self-pay

## 2019-12-26 ENCOUNTER — Ambulatory Visit (INDEPENDENT_AMBULATORY_CARE_PROVIDER_SITE_OTHER): Payer: 59 | Admitting: Family Medicine

## 2019-12-26 VITALS — BP 114/68 | HR 98 | Wt 188.0 lb

## 2019-12-26 DIAGNOSIS — K824 Cholesterolosis of gallbladder: Secondary | ICD-10-CM

## 2019-12-26 DIAGNOSIS — Z3400 Encounter for supervision of normal first pregnancy, unspecified trimester: Secondary | ICD-10-CM

## 2019-12-26 DIAGNOSIS — Z23 Encounter for immunization: Secondary | ICD-10-CM

## 2019-12-26 DIAGNOSIS — O99891 Other specified diseases and conditions complicating pregnancy: Secondary | ICD-10-CM | POA: Diagnosis not present

## 2019-12-26 DIAGNOSIS — D508 Other iron deficiency anemias: Secondary | ICD-10-CM

## 2019-12-26 DIAGNOSIS — M9908 Segmental and somatic dysfunction of rib cage: Secondary | ICD-10-CM

## 2019-12-26 DIAGNOSIS — Z3A29 29 weeks gestation of pregnancy: Secondary | ICD-10-CM

## 2019-12-26 DIAGNOSIS — O210 Mild hyperemesis gravidarum: Secondary | ICD-10-CM

## 2019-12-26 DIAGNOSIS — M9902 Segmental and somatic dysfunction of thoracic region: Secondary | ICD-10-CM | POA: Diagnosis not present

## 2019-12-26 DIAGNOSIS — O99013 Anemia complicating pregnancy, third trimester: Secondary | ICD-10-CM

## 2019-12-26 DIAGNOSIS — R0781 Pleurodynia: Secondary | ICD-10-CM

## 2019-12-26 MED ORDER — DIPHENHYDRAMINE HCL 50 MG/ML IJ SOLN
INTRAMUSCULAR | Status: AC
  Administered 2019-12-26: 25 mg via INTRAVENOUS
  Filled 2019-12-26: qty 1

## 2019-12-26 MED ORDER — DIPHENHYDRAMINE HCL 50 MG/ML IJ SOLN: 25.0000 mg | INTRAMUSCULAR | Status: DC

## 2019-12-26 MED ORDER — PROMETHAZINE HCL 25 MG/ML IJ SOLN
25.0000 mg | INTRAVENOUS | Status: DC
  Administered 2019-12-26: 25 mg via INTRAVENOUS
  Filled 2019-12-26: qty 1

## 2019-12-26 MED ORDER — PANTOPRAZOLE SODIUM 40 MG PO TBEC: 40.0000 mg | DELAYED_RELEASE_TABLET | Freq: Every day | ORAL | 3 refills | Status: DC

## 2019-12-26 NOTE — Progress Notes (Signed)
Pt unable to do 2 hr GTT due to Lab being closed today 12/26/19. Pt will call the office to reschedule. Ashle Stief l Kourosh Jablonsky, CMA

## 2019-12-26 NOTE — Progress Notes (Signed)
   PRENATAL VISIT NOTE  Subjective:  Michelle Vincent is a 29 y.o. G1P0000 at [redacted]w[redacted]d being seen today for ongoing prenatal care.  She is currently monitored for the following issues for this low-risk pregnancy and has Tachycardia; Supervision of normal first pregnancy, antepartum; Inappropriate sinus tachycardia; Allergic rhinitis; Chronic migraine without aura without status migrainosus, not intractable; Mild intermittent asthma without complication; Shift work sleep disorder; Hyperemesis gravidarum; Pelvic pain affecting pregnancy in third trimester, antepartum; and Gallbladder polyp on their problem list.  Patient reports having pain in RUQ area. went to ED and was told she has GB polyp and that her pain was from that. No pain with eating or fatty meals. Pain with movement..  Contractions: Not present. Vag. Bleeding: None.  Movement: Present. Denies leaking of fluid.   The following portions of the patient's history were reviewed and updated as appropriate: allergies, current medications, past family history, past medical history, past social history, past surgical history and problem list. Problem list updated.  Objective:   Vitals:   12/26/19 0915  BP: 114/68  Pulse: 98  Weight: 188 lb (85.3 kg)    Fetal Status: Fetal Heart Rate (bpm): 129 Fundal Height: 31 cm Movement: Present     General:  Alert, oriented and cooperative. Patient is in no acute distress.  Skin: Skin is warm and dry. No rash noted.   Cardiovascular: Normal heart rate noted  Respiratory: Normal respiratory effort, no problems with respiration noted  Abdomen: Soft, gravid, appropriate for gestational age. Pain/Pressure: Present     Pelvic:  Cervical exam deferred        MSK: Restriction, tenderness, tissue texture changes, and paraspinal spasm in the right lower thoracic spine  Neuro: Moves all four extremities with no focal neurological deficit  Extremities: Normal range of motion.  Edema: Trace  Mental Status:  Normal mood and affect. Normal behavior. Normal judgment and thought content.   OSE: Head   Cervical   Thoracic T9-10 FSRR  Rib Ribs 9-10 inhaled on right  Lumbar   Sacrum   Pelvis     Assessment and Plan:  Pregnancy: G1P0000 at [redacted]w[redacted]d  1. Supervision of normal first pregnancy, antepartum FHT and FH normal. Lab closed, patient to return for labs next week. Does have some low protein and albumin on CMP - recommended increasing protein in diet.  - CBC - Glucose Tolerance, 2 Hours w/1 Hour - HIV Antibody (routine testing w rflx) - RPR - Tdap vaccine greater than or equal to 7yo IM  2. Hyperemesis gravidarum Getting infusions with phenergan - still finds them helpful. When she misses, has vomiting and doesn't tolerate diet.  3. Gallbladder polyp No evidence of obstruction. Polyps wouldn't be source of her pain without obstruction  4. Iron deficiency anemia secondary to inadequate dietary iron intake Start iron BID. May need iron infusion  5. Anemia of pregnancy in third trimester   6. Rib pain 7. Somatic dysfunction of rib 8. Somatic dysfunction of spine, thoracic OMT done after patient permission. HVLA technique utilized. 2 areas treated with improvement of tissue texture and joint mobility. Patient tolerated procedure well.    Preterm labor symptoms and general obstetric precautions including but not limited to vaginal bleeding, contractions, leaking of fluid and fetal movement were reviewed in detail with the patient. Please refer to After Visit Summary for other counseling recommendations.  Return in about 2 weeks (around 01/09/2020) for OB f/u, In Office.  Levie Heritage, DO

## 2019-12-29 ENCOUNTER — Ambulatory Visit (HOSPITAL_COMMUNITY)
Admission: RE | Admit: 2019-12-29 | Discharge: 2019-12-29 | Disposition: A | Discharge status: Home / Self Care | Payer: 59 | Source: Ambulatory Visit | Attending: Family Medicine | Admitting: Family Medicine

## 2019-12-29 ENCOUNTER — Other Ambulatory Visit: Payer: Self-pay

## 2019-12-29 DIAGNOSIS — O210 Mild hyperemesis gravidarum: Secondary | ICD-10-CM | POA: Diagnosis not present

## 2019-12-29 MED ORDER — DIPHENHYDRAMINE HCL 50 MG/ML IJ SOLN
INTRAMUSCULAR | Status: AC
  Administered 2019-12-29: 11:00:00 25 mg via INTRAVENOUS
  Filled 2019-12-29: qty 1

## 2019-12-29 MED ORDER — PROMETHAZINE HCL 25 MG/ML IJ SOLN
25.0000 mg | INTRAVENOUS | Status: DC
  Administered 2019-12-29: 25 mg via INTRAVENOUS
  Filled 2019-12-29: qty 1

## 2019-12-29 MED ORDER — DIPHENHYDRAMINE HCL 50 MG/ML IJ SOLN: 25.0000 mg | INTRAMUSCULAR | Status: DC

## 2019-12-31 ENCOUNTER — Other Ambulatory Visit: Payer: Self-pay

## 2019-12-31 ENCOUNTER — Ambulatory Visit (HOSPITAL_COMMUNITY)
Admission: RE | Admit: 2019-12-31 | Discharge: 2019-12-31 | Disposition: A | Discharge status: Home / Self Care | Payer: 59 | Source: Ambulatory Visit | Attending: Family Medicine | Admitting: Family Medicine

## 2019-12-31 DIAGNOSIS — O210 Mild hyperemesis gravidarum: Secondary | ICD-10-CM | POA: Diagnosis not present

## 2019-12-31 MED ORDER — PROMETHAZINE HCL 25 MG/ML IJ SOLN
25.0000 mg | INTRAVENOUS | Status: DC
  Administered 2019-12-31: 25 mg via INTRAVENOUS
  Filled 2019-12-31: qty 1

## 2019-12-31 MED ORDER — DIPHENHYDRAMINE HCL 50 MG/ML IJ SOLN: 25.0000 mg | INTRAMUSCULAR | Status: DC

## 2019-12-31 MED ORDER — DIPHENHYDRAMINE HCL 50 MG/ML IJ SOLN
INTRAMUSCULAR | Status: AC
  Administered 2019-12-31: 25 mg via INTRAVENOUS
  Filled 2019-12-31: qty 1

## 2020-01-02 ENCOUNTER — Ambulatory Visit (HOSPITAL_COMMUNITY)
Admission: RE | Admit: 2020-01-02 | Discharge: 2020-01-02 | Disposition: A | Discharge status: Home / Self Care | Payer: 59 | Source: Ambulatory Visit | Attending: Family Medicine | Admitting: Family Medicine

## 2020-01-02 ENCOUNTER — Other Ambulatory Visit: Payer: Self-pay

## 2020-01-02 DIAGNOSIS — O210 Mild hyperemesis gravidarum: Secondary | ICD-10-CM | POA: Diagnosis not present

## 2020-01-02 MED ORDER — DIPHENHYDRAMINE HCL 50 MG/ML IJ SOLN
INTRAMUSCULAR | Status: AC
  Filled 2020-01-02: qty 1

## 2020-01-02 MED ORDER — DIPHENHYDRAMINE HCL 50 MG/ML IJ SOLN
25.0000 mg | INTRAMUSCULAR | Status: DC
  Administered 2020-01-02: 25 mg via INTRAVENOUS

## 2020-01-02 MED ORDER — PROMETHAZINE HCL 25 MG/ML IJ SOLN
25.0000 mg | INTRAVENOUS | Status: DC
  Administered 2020-01-02: 25 mg via INTRAVENOUS
  Filled 2020-01-02: qty 1

## 2020-01-02 NOTE — Progress Notes (Signed)
Called to room by patient, her PIV was a 24 gauge in her left hand.  On the anterior part of her wrist on the left hand the veins were red and itchy, no pain or swelling, no swelling, pain, or  itching at the site of the PIV in the hand according to the pt.  Fluids were stopped and IV team was called.  They stated to remove the IV from the site and ice the area.  This was done and a PIV was started in her right arm and fluids restarted.

## 2020-01-05 ENCOUNTER — Encounter (HOSPITAL_COMMUNITY)
Admission: RE | Admit: 2020-01-05 | Discharge: 2020-01-05 | Disposition: A | Discharge status: Home / Self Care | Payer: 59 | Source: Ambulatory Visit | Attending: Family Medicine | Admitting: Family Medicine

## 2020-01-05 ENCOUNTER — Other Ambulatory Visit: Payer: Self-pay

## 2020-01-05 DIAGNOSIS — R000 Tachycardia, unspecified: Secondary | ICD-10-CM | POA: Diagnosis present

## 2020-01-05 DIAGNOSIS — O210 Mild hyperemesis gravidarum: Secondary | ICD-10-CM | POA: Diagnosis present

## 2020-01-05 DIAGNOSIS — Z3A31 31 weeks gestation of pregnancy: Secondary | ICD-10-CM | POA: Diagnosis not present

## 2020-01-05 MED ORDER — PROMETHAZINE HCL 25 MG/ML IJ SOLN
25.0000 mg | INTRAVENOUS | Status: DC
  Administered 2020-01-05: 25 mg via INTRAVENOUS
  Filled 2020-01-05: qty 1

## 2020-01-05 MED ORDER — DIPHENHYDRAMINE HCL 50 MG/ML IJ SOLN: 25.0000 mg | INTRAMUSCULAR | Status: DC

## 2020-01-05 MED ORDER — DIPHENHYDRAMINE HCL 50 MG/ML IJ SOLN
INTRAMUSCULAR | Status: AC
  Administered 2020-01-05: 25 mg
  Filled 2020-01-05: qty 1

## 2020-01-07 ENCOUNTER — Telehealth: Payer: Self-pay

## 2020-01-07 ENCOUNTER — Inpatient Hospital Stay (EMERGENCY_DEPARTMENT_HOSPITAL)
Admission: AD | Admit: 2020-01-07 | Discharge: 2020-01-07 | Disposition: A | Discharge status: Home / Self Care | Payer: 59 | Source: Home / Self Care | Attending: Obstetrics & Gynecology | Admitting: Obstetrics & Gynecology

## 2020-01-07 ENCOUNTER — Other Ambulatory Visit: Payer: Self-pay

## 2020-01-07 ENCOUNTER — Encounter (HOSPITAL_COMMUNITY)
Admission: RE | Admit: 2020-01-07 | Discharge: 2020-01-07 | Disposition: A | Discharge status: Home / Self Care | Payer: 59 | Source: Ambulatory Visit | Attending: Family Medicine | Admitting: Family Medicine

## 2020-01-07 ENCOUNTER — Encounter (HOSPITAL_COMMUNITY): Payer: Self-pay | Admitting: Obstetrics & Gynecology

## 2020-01-07 DIAGNOSIS — R42 Dizziness and giddiness: Secondary | ICD-10-CM | POA: Insufficient documentation

## 2020-01-07 DIAGNOSIS — Z3A31 31 weeks gestation of pregnancy: Secondary | ICD-10-CM

## 2020-01-07 DIAGNOSIS — R000 Tachycardia, unspecified: Secondary | ICD-10-CM | POA: Diagnosis present

## 2020-01-07 DIAGNOSIS — O210 Mild hyperemesis gravidarum: Secondary | ICD-10-CM | POA: Insufficient documentation

## 2020-01-07 DIAGNOSIS — O26893 Other specified pregnancy related conditions, third trimester: Secondary | ICD-10-CM | POA: Insufficient documentation

## 2020-01-07 LAB — CBC
HCT: 30.5 % — ABNORMAL LOW (ref 36.0–46.0)
Hemoglobin: 9.9 g/dL — ABNORMAL LOW (ref 12.0–15.0)
MCH: 26.8 pg (ref 26.0–34.0)
MCHC: 32.5 g/dL (ref 30.0–36.0)
MCV: 82.7 fL (ref 80.0–100.0)
Platelets: 311 10*3/uL (ref 150–400)
RBC: 3.69 MIL/uL — ABNORMAL LOW (ref 3.87–5.11)
RDW: 12.4 % (ref 11.5–15.5)
WBC: 13.3 10*3/uL — ABNORMAL HIGH (ref 4.0–10.5)
nRBC: 0 % (ref 0.0–0.2)

## 2020-01-07 LAB — URINALYSIS, ROUTINE W REFLEX MICROSCOPIC
Bilirubin Urine: NEGATIVE
Glucose, UA: NEGATIVE mg/dL
Hgb urine dipstick: NEGATIVE
Ketones, ur: NEGATIVE mg/dL
Leukocytes,Ua: NEGATIVE
Nitrite: NEGATIVE
Protein, ur: NEGATIVE mg/dL
Specific Gravity, Urine: 1.004 — ABNORMAL LOW (ref 1.005–1.030)
pH: 7 (ref 5.0–8.0)

## 2020-01-07 MED ORDER — DIPHENHYDRAMINE HCL 50 MG/ML IJ SOLN
25.0000 mg | INTRAMUSCULAR | Status: DC
  Administered 2020-01-07: 25 mg via INTRAVENOUS

## 2020-01-07 MED ORDER — DIPHENHYDRAMINE HCL 50 MG/ML IJ SOLN
INTRAMUSCULAR | Status: AC
  Filled 2020-01-07: qty 1

## 2020-01-07 MED ORDER — PROMETHAZINE HCL 25 MG/ML IJ SOLN
25.0000 mg | INTRAVENOUS | Status: DC
  Administered 2020-01-07: 25 mg via INTRAVENOUS
  Filled 2020-01-07: qty 1

## 2020-01-07 MED ORDER — PROMETHAZINE HCL 25 MG/ML IJ SOLN
25.0000 mg | Freq: Once | INTRAVENOUS | Status: AC
  Administered 2020-01-07: 25 mg via INTRAVENOUS
  Filled 2020-01-07: qty 1

## 2020-01-07 NOTE — Telephone Encounter (Signed)
Boneta Lucks from [facility] called stating pt is receiving IV infusion. Boneta Lucks states pt is feeling dizzy and her heart rate was 150 and now it is  the 120. Barbaraann Boys to [facility]. Understanding was voiced. Krysteena Stalker l Brandilyn Nanninga, CMA

## 2020-01-07 NOTE — Discharge Instructions (Signed)
Sinus Tachycardia  Sinus tachycardia is a kind of fast heartbeat. In sinus tachycardia, the heart beats more than 100 times a minute. Sinus tachycardia starts in a part of the heart called the sinus node. Sinus tachycardia may be harmless, or it may be a sign of a serious condition. What are the causes? This condition may be caused by:  Exercise or exertion.  A fever.  Pain.  Loss of body fluids (dehydration).  Severe bleeding (hemorrhage).  Anxiety and stress.  Certain substances, including: ? Alcohol. ? Caffeine. ? Tobacco and nicotine products. ? Cold medicines. ? Illegal drugs.  Medical conditions including: ? Heart disease. ? An infection. ? An overactive thyroid (hyperthyroidism). ? A lack of red blood cells (anemia). What are the signs or symptoms? Symptoms of this condition include:  A feeling that the heart is beating quickly (palpitations).  Suddenly noticing your heartbeat (cardiac awareness).  Dizziness.  Tiredness (fatigue).  Shortness of breath.  Chest pain.  Nausea.  Fainting. How is this diagnosed? This condition is diagnosed with:  A physical exam.  Other tests, such as: ? Blood tests. ? An electrocardiogram (ECG). This test measures the electrical activity of the heart. ? Ambulatory cardiac monitor. This records your heartbeats for 24 hours or more. You may be referred to a heart specialist (cardiologist). How is this treated? Treatment for this condition depends on the cause or the underlying condition. Treatment may involve:  Treating the underlying condition.  Taking new medicines or changing your current medicines as told by your health care provider.  Making changes to your diet or lifestyle. Follow these instructions at home: Lifestyle   Do not use any products that contain nicotine or tobacco, such as cigarettes and e-cigarettes. If you need help quitting, ask your health care provider.  Do not use illegal drugs, such as  cocaine.  Learn relaxation methods to help you when you get stressed or anxious. These include deep breathing.  Avoid caffeine or other stimulants. Alcohol use   Do not drink alcohol if: ? Your health care provider tells you not to drink. ? You are pregnant, may be pregnant, or are planning to become pregnant.  If you drink alcohol, limit how much you have: ? 0-1 drink a day for women. ? 0-2 drinks a day for men.  Be aware of how much alcohol is in your drink. In the [place] one drink equals one typical bottle of beer (12 oz), one-half glass of wine (5 oz), or one shot of hard liquor (1 oz). General instructions  Drink enough fluids to keep your urine pale yellow.  Take over-the-counter and prescription medicines only as told by your health care provider.  Keep all follow-up visits as told by your health care provider. This is important. Contact a health care provider if you have:  A fever.  Vomiting or diarrhea that does not go away. Get help right away if you:  Have pain in your chest, upper arms, jaw, or neck.  Become weak or dizzy.  Feel faint.  Have palpitations that do not go away. Summary  In sinus tachycardia, the heart beats more than 100 times a minute.  Sinus tachycardia may be harmless, or it may be a sign of a serious condition.  Treatment for this condition depends on the cause or the underlying condition.  Get help right away if you have pain in your chest, upper arms, jaw, or neck. This information is not intended to replace advice given to you by   your health care provider. Make sure you discuss any questions you have with your health care provider. Document Revised: 12/12/2017 Document Reviewed: 12/12/2017 Elsevier Patient Education  2020 [employer]  

## 2020-01-07 NOTE — MAU Note (Signed)
.   Michelle Vincent is a 29 y.o. at [redacted]w[redacted]d here in MAU reporting: that she was at the day center receiving her LR with Phenergan that she receives 3 times a week because she felt dizzy and her heart rate was up. Holley Dexter RN brought pt with IV of LR with 25mg  of phenergan infusing via IV pump. Pump stopped once pt got into room. Reports headache  Onset of complaint: ongoing Pain score: 4 Vitals:   01/07/20 1129 01/07/20 1130  BP: 121/85   Pulse: (!) 103   Resp: 18   Temp: 97.6 F (36.4 C)   SpO2:  99%     FHT:142 Lab orders placed from triage:

## 2020-01-07 NOTE — Progress Notes (Signed)
Spoke with Chaquita at Dr. Denyce Robert office about patient feeling dizzy and Heart rate 120s-150. Patient was taken to Maternity Admissions center as instructed.  Report given to nurse in admission area.

## 2020-01-07 NOTE — MAU Provider Note (Addendum)
History     CSN: [id number]  Arrival date and time: 01/07/20 1112   First Provider Initiated Contact with Patient 01/07/20 1200      Chief Complaint  Patient presents with  . Tachycardia   HPI  [name] is a 29 y.o. year old G24P0000 female at [redacted]w[redacted]d weeks gestation who was brought to MAU from day surgery reporting a high heart rate of 150 and 120 bpm and dizziness. She was escorted by a day surgery RN with an active Phenergan IV infusion. She reports she ate breakfast of chocolate milk and a bagel @ 0915 this morning. She reports (+) FM.  Past Medical History:  Diagnosis Date  . Anemia   . Anxiety    takes lexapro  . Asthma    prn inhaler use  . HA (headache)   . Migraine   . Occipital neuralgia   . Sinus tachycardia   . SVT (supraventricular tachycardia) (HCC)     Past Surgical History:  Procedure Laterality Date  . shoulder Right   . TONSILLECTOMY AND ADENOIDECTOMY    . tubes in ears      Family History  Problem Relation Age of Onset  . Hypertension Mother   . Transient ischemic attack Mother   . Diabetes Brother        Type 1  . Breast cancer Maternal Aunt   . Cancer Maternal Aunt     Social History   Tobacco Use  . Smoking status: Never Smoker  . Smokeless tobacco: Never Used  Substance Use Topics  . Alcohol use: Not Currently  . Drug use: Never    Allergies:  Allergies  Allergen Reactions  . Nitrous Oxide Anaphylaxis  . Peach Flavor Anaphylaxis  . Shellfish Allergy Anaphylaxis  . Adhesive [Tape] Hives, Itching and Other (See Comments)    blisters  . Morphine And Related Hives and Itching    Facility-Administered Medications Prior to Admission  Medication Dose Route Frequency Provider Last Rate Last Admin  . diphenhydrAMINE (BENADRYL) injection 25 mg  25 mg Intravenous Q6H PRN Adam Phenix, MD       Medications Prior to Admission  Medication Sig Dispense Refill Last Dose  . Butalbital-APAP-Caffeine 50-325-40 MG capsule  Take 1-2 capsules by mouth every 6 (six) hours as needed for headache. 30 capsule 3   . cyclobenzaprine (FLEXERIL) 10 MG tablet Take 1 tablet (10 mg total) by mouth 3 (three) times daily as needed for muscle spasms. 40 tablet 3   . Elastic Bandages & Supports (COMFORT FIT MATERNITY SUPP SM) MISC 1 Units by Does not apply route daily as needed. 1 each 0   . escitalopram (LEXAPRO) 10 MG tablet Take 1 tablet (10 mg total) by mouth daily. 30 tablet 3   . famotidine (PEPCID) 20 MG tablet Take 1 tablet (20 mg total) by mouth 2 (two) times daily. 60 tablet 3   . ferrous sulfate 325 (65 FE) MG tablet Take 325 mg by mouth 3 (three) times daily.     . Magnesium 400 MG TABS Take 400 mg by mouth daily. (Patient not taking: Reported on 10/22/2019) 30 tablet 0   . metoCLOPramide (REGLAN) 10 MG tablet Take 1 tablet (10 mg total) by mouth 3 (three) times daily before meals. (Patient not taking: Reported on 11/19/2019) 30 tablet 2   . ondansetron (ZOFRAN) 8 MG tablet Take 1 tablet (8 mg total) by mouth every 8 (eight) hours as needed for nausea or vomiting. 20 tablet 2   .  pantoprazole (PROTONIX) 40 MG tablet Take 1 tablet (40 mg total) by mouth daily. 30 tablet 3   . Prenatal Vit-Fe Fumarate-FA (PRENATAL MULTIVITAMIN) TABS tablet Take 1 tablet by mouth daily at 12 noon.     . promethazine (PHENERGAN) 25 MG suppository Place 1 suppository (25 mg total) rectally every 6 (six) hours as needed for nausea or vomiting. (Patient not taking: Reported on 12/12/2019) 60 each 5   . propranolol (INDERAL) 10 MG tablet Take 10 mg by mouth 2 (two) times daily.     Marland Kitchen zolpidem (AMBIEN) 5 MG tablet Take 1 tablet (5 mg total) by mouth at bedtime as needed for sleep. 30 tablet 1     Review of Systems  Constitutional: Negative.   HENT: Negative.   Eyes: Negative.   Respiratory: Negative.   Cardiovascular: Positive for palpitations.  Gastrointestinal: Positive for nausea and vomiting.  Endocrine: Negative.   Genitourinary: Negative.    Musculoskeletal: Negative.   Skin: Negative.   Allergic/Immunologic: Negative.   Neurological: Negative.   Hematological: Negative.   Psychiatric/Behavioral: Negative.    Physical Exam   Patient Vitals for the past 24 hrs:  BP Temp Pulse Resp SpO2  01/07/20 1130 -- -- -- -- 99 %  01/07/20 1129 121/85 97.6 F (36.4 C) (!) 103 18 --    Physical Exam  Nursing note and vitals reviewed. Constitutional: She is oriented to person, place, and time. She appears well-developed and well-nourished.  HENT:  Head: Normocephalic and atraumatic.  Eyes: Pupils are equal, round, and reactive to light.  Cardiovascular: Normal heart sounds and intact distal pulses.  Respiratory: Effort normal.  GI: Soft.  Genitourinary:    Genitourinary Comments: Not indicated   Musculoskeletal:        General: Normal range of motion.     Cervical back: Normal range of motion.  Neurological: She is alert and oriented to person, place, and time.  Skin: Skin is warm and dry.  Psychiatric: She has a normal mood and affect. Her behavior is normal. Judgment and thought content normal.    MAU Course  Procedures  MDM EKG - Sinus tachycardia, T wave abnormality, consider inferior ischemia/ Abnormal ECG Phenergan 25 mg 1000 ml @ 999 ml/hr (patient was brought to MAU in the process of her Phenergan infusion) -- patient reports "feeling better" Reassessment @ 1220: dizziness is milder and she feels much better than before she arrived in MAU  *Consult with Dr. Tonny Bollman @ 1215 - notified of patient's complaints, assessments, lab & EKG results. Dr. Excell Seltzer reviewed EKG and states the results are stable with previous EKG and no other intervention required at this time    Results for orders placed or performed during the hospital encounter of 01/07/20 (from the past 24 hour(s))  CBC     Status: Abnormal   Collection Time: 01/07/20 11:35 AM  Result Value Ref Range   WBC 13.3 (H) 4.0 - 10.5 K/uL   RBC 3.69 (L)  3.87 - 5.11 MIL/uL   Hemoglobin 9.9 (L) 12.0 - 15.0 g/dL   HCT 25.8 (L) 52.7 - 78.2 %   MCV 82.7 80.0 - 100.0 fL   MCH 26.8 26.0 - 34.0 pg   MCHC 32.5 30.0 - 36.0 g/dL   RDW 42.3 53.6 - 14.4 %   Platelets 311 150 - 400 K/uL   nRBC 0.0 0.0 - 0.2 %  Urinalysis, Routine w reflex microscopic     Status: Abnormal   Collection Time: 01/07/20 12:20 PM  Result Value Ref Range   Color, Urine STRAW (A) YELLOW   APPearance HAZY (A) CLEAR   Specific Gravity, Urine 1.004 (L) 1.005 - 1.030   pH 7.0 5.0 - 8.0   Glucose, UA NEGATIVE NEGATIVE mg/dL   Hgb urine dipstick NEGATIVE NEGATIVE   Bilirubin Urine NEGATIVE NEGATIVE   Ketones, ur NEGATIVE NEGATIVE mg/dL   Protein, ur NEGATIVE NEGATIVE mg/dL   Nitrite NEGATIVE NEGATIVE   Leukocytes,Ua NEGATIVE NEGATIVE    Assessment and Plan  Hyperemesis gravidarum - Continue with current treatment plan - Advised to eat some protein with meals  Tachycardia  - Advised that EKG is stable and unchanged from EKG in December - Advised that she could have had the episode d/t dehydration compounded with being in 2nd trimester pregnancy - Information provided on tachycardia in adult   - Discharge patient - Keep scheduled appts - Patient verbalized an understanding of the plan of care and agrees.     Laury Deep, MSN, CNM 01/07/2020, 12:01 PM

## 2020-01-08 ENCOUNTER — Ambulatory Visit (INDEPENDENT_AMBULATORY_CARE_PROVIDER_SITE_OTHER): Payer: 59 | Admitting: Family Medicine

## 2020-01-08 ENCOUNTER — Telehealth: Payer: Self-pay | Admitting: Cardiology

## 2020-01-08 VITALS — BP 119/75 | HR 116 | Wt 190.0 lb

## 2020-01-08 DIAGNOSIS — R000 Tachycardia, unspecified: Secondary | ICD-10-CM

## 2020-01-08 DIAGNOSIS — Z3A31 31 weeks gestation of pregnancy: Secondary | ICD-10-CM

## 2020-01-08 DIAGNOSIS — O210 Mild hyperemesis gravidarum: Secondary | ICD-10-CM

## 2020-01-08 DIAGNOSIS — Z3403 Encounter for supervision of normal first pregnancy, third trimester: Secondary | ICD-10-CM

## 2020-01-08 DIAGNOSIS — Z3400 Encounter for supervision of normal first pregnancy, unspecified trimester: Secondary | ICD-10-CM

## 2020-01-08 DIAGNOSIS — J4520 Mild intermittent asthma, uncomplicated: Secondary | ICD-10-CM

## 2020-01-08 MED ORDER — PROPRANOLOL HCL 10 MG PO TABS: 20.0000 mg | ORAL_TABLET | Freq: Two times a day (BID) | ORAL | 3 refills | Status: DC

## 2020-01-08 NOTE — Telephone Encounter (Signed)
  STAT if HR is under 50 or over 120 (normal HR is 60-100 beats per minute)  1) What is your heart rate? 140   2) Do you have a log of your heart rate readings (document readings)? 120 to 160 since yesterday  3) Do you have any other symptoms? Dizzy, SOB   Patient states he HR was been very high and ranging from 120 to 160 since yesterday. She is also having some SOB and dizziness.

## 2020-01-08 NOTE — Progress Notes (Signed)
   PRENATAL VISIT NOTE  Subjective:  Michelle Vincent is a 29 y.o. G1P0000 at [redacted]w[redacted]d being seen today for ongoing prenatal care.  She is currently monitored for the following issues for this high-risk pregnancy and has Tachycardia; Supervision of normal first pregnancy, antepartum; Inappropriate sinus tachycardia; Allergic rhinitis; Chronic migraine without aura without status migrainosus, not intractable; Mild intermittent asthma without complication; Shift work sleep disorder; Hyperemesis gravidarum; Pelvic pain affecting pregnancy in third trimester, antepartum; and Gallbladder polyp on their problem list.  Patient reports increased heart rate. Has been elevated over the last few days, going up to the 150s.  Contractions: Not present. Vag. Bleeding: None.  Movement: Present. Denies leaking of fluid.   The following portions of the patient's history were reviewed and updated as appropriate: allergies, current medications, past family history, past medical history, past social history, past surgical history and problem list.   Objective:   Vitals:   01/08/20 1038  BP: 119/75  Pulse: (!) 116  Weight: 190 lb (86.2 kg)    Fetal Status:     Movement: Present     General:  Alert, oriented and cooperative. Patient is in no acute distress.  Skin: Skin is warm and dry. No rash noted.   Cardiovascular: Normal heart rate noted  Respiratory: Normal respiratory effort, no problems with respiration noted  Abdomen: Soft, gravid, appropriate for gestational age.  Pain/Pressure: Present     Pelvic: Cervical exam deferred        Extremities: Normal range of motion.  Edema: Trace  Mental Status: Normal mood and affect. Normal behavior. Normal judgment and thought content.   Assessment and Plan:  Pregnancy: G1P0000 at [redacted]w[redacted]d 1. Supervision of normal first pregnancy, antepartum FHT and FH normal - Glucose tolerance, 1 hour - CBC - HIV antibody (with reflex) - RPR  2. Hyperemesis  gravidarum Stable with infusions  3. Mild intermittent asthma without complication stable  4. Inappropriate sinus tachycardia On propranolol. (metoprolol and labetalol not effective). Increase to 20mg  BID. May need to change dosing to TID as metabolism of medication increased.  Preterm labor symptoms and general obstetric precautions including but not limited to vaginal bleeding, contractions, leaking of fluid and fetal movement were reviewed in detail with the patient. Please refer to After Visit Summary for other counseling recommendations. Has f/u with cardiology on 3/17.  Return in about 2 weeks (around 01/22/2020).  Future Appointments  Date Time Provider Department Center  01/09/2020 12:00 PM [facility] ROOM 8 [facility] None  01/21/2020  1:45 PM 01/23/2020, NP [facility]  01/22/2020 11:00 AM 01/24/2020, DO [facility] None    Levie Heritage, DO

## 2020-01-08 NOTE — Telephone Encounter (Signed)
Spoke with pt, she reports a heart rate over 120 since yesterday. She went to the women[facility] clinic yesterday and they said it was because she drank chocolate milk. This morning her heart rate is 150 bpm. She does have some nausea and lightheadedness but generally feels okay. She has tried val-salva maneuvers with no change. Her bp is 128/70, okay given for patient to take an extra propranolol now. She will call back if no relief or if symptoms change.

## 2020-01-09 ENCOUNTER — Ambulatory Visit (HOSPITAL_COMMUNITY)
Admission: RE | Admit: 2020-01-09 | Discharge: 2020-01-09 | Disposition: A | Discharge status: Home / Self Care | Payer: 59 | Source: Ambulatory Visit | Attending: Family Medicine | Admitting: Family Medicine

## 2020-01-09 ENCOUNTER — Other Ambulatory Visit: Payer: Self-pay

## 2020-01-09 ENCOUNTER — Encounter: Payer: 59 | Admitting: Family Medicine

## 2020-01-09 DIAGNOSIS — O210 Mild hyperemesis gravidarum: Secondary | ICD-10-CM | POA: Insufficient documentation

## 2020-01-09 DIAGNOSIS — Z3A10 10 weeks gestation of pregnancy: Secondary | ICD-10-CM | POA: Diagnosis not present

## 2020-01-09 LAB — CBC
Hematocrit: 29.8 % — ABNORMAL LOW (ref 34.0–46.6)
Hemoglobin: 9.7 g/dL — ABNORMAL LOW (ref 11.1–15.9)
MCH: 26.6 pg (ref 26.6–33.0)
MCHC: 32.6 g/dL (ref 31.5–35.7)
MCV: 82 fL (ref 79–97)
Platelets: 302 10*3/uL (ref 150–450)
RBC: 3.65 x10E6/uL — ABNORMAL LOW (ref 3.77–5.28)
RDW: 12.5 % (ref 11.7–15.4)
WBC: 12.4 10*3/uL — ABNORMAL HIGH (ref 3.4–10.8)

## 2020-01-09 LAB — RPR: RPR Ser Ql: NONREACTIVE

## 2020-01-09 LAB — GLUCOSE TOLERANCE, 1 HOUR: Glucose, 1Hr PP: 96 mg/dL (ref 65–199)

## 2020-01-09 LAB — HIV ANTIBODY (ROUTINE TESTING W REFLEX): HIV Screen 4th Generation wRfx: NONREACTIVE

## 2020-01-09 MED ORDER — PROMETHAZINE HCL 25 MG/ML IJ SOLN
25.0000 mg | Freq: Once | INTRAVENOUS | Status: AC
  Administered 2020-01-09: 25 mg via INTRAVENOUS
  Filled 2020-01-09: qty 1

## 2020-01-09 MED ORDER — DIPHENHYDRAMINE HCL 50 MG/ML IJ SOLN
INTRAMUSCULAR | Status: AC
  Administered 2020-01-09: 25 mg via INTRAVENOUS
  Filled 2020-01-09: qty 1

## 2020-01-09 MED ORDER — DIPHENHYDRAMINE HCL 50 MG/ML IJ SOLN: 25.0000 mg | Freq: Once | INTRAMUSCULAR | Status: AC

## 2020-01-12 ENCOUNTER — Encounter (HOSPITAL_COMMUNITY): Payer: 59

## 2020-01-12 ENCOUNTER — Other Ambulatory Visit: Payer: Self-pay | Admitting: Obstetrics & Gynecology

## 2020-01-13 ENCOUNTER — Other Ambulatory Visit: Payer: Self-pay

## 2020-01-13 ENCOUNTER — Other Ambulatory Visit: Payer: Self-pay | Admitting: Family Medicine

## 2020-01-13 ENCOUNTER — Encounter (HOSPITAL_COMMUNITY)
Admission: RE | Admit: 2020-01-13 | Discharge: 2020-01-13 | Disposition: A | Discharge status: Home / Self Care | Payer: 59 | Source: Ambulatory Visit | Attending: Family Medicine | Admitting: Family Medicine

## 2020-01-13 MED ORDER — SODIUM CHLORIDE 0.9 % IV SOLN
25.0000 mg | INTRAVENOUS | Status: DC
  Administered 2020-01-13: 25 mg via INTRAVENOUS
  Filled 2020-01-13: qty 1

## 2020-01-13 MED ORDER — DIPHENHYDRAMINE HCL 25 MG PO CAPS: 25.0000 mg | ORAL_CAPSULE | Freq: Four times a day (QID) | ORAL | Status: DC | PRN

## 2020-01-13 MED ORDER — DIPHENHYDRAMINE HCL 50 MG/ML IJ SOLN
INTRAMUSCULAR | Status: AC
  Administered 2020-01-13: 25 mg via INTRAVENOUS
  Filled 2020-01-13: qty 1

## 2020-01-13 MED ORDER — DIPHENHYDRAMINE HCL 50 MG/ML IJ SOLN: 25.0000 mg | INTRAMUSCULAR | Status: DC

## 2020-01-14 ENCOUNTER — Ambulatory Visit (HOSPITAL_COMMUNITY)
Admission: RE | Admit: 2020-01-14 | Discharge: 2020-01-14 | Disposition: A | Discharge status: Home / Self Care | Payer: 59 | Attending: Family Medicine | Admitting: Family Medicine

## 2020-01-14 DIAGNOSIS — O210 Mild hyperemesis gravidarum: Secondary | ICD-10-CM | POA: Diagnosis present

## 2020-01-14 MED ORDER — DIPHENHYDRAMINE HCL 50 MG/ML IJ SOLN: 25.0000 mg | INTRAMUSCULAR | Status: DC

## 2020-01-14 MED ORDER — DIPHENHYDRAMINE HCL 50 MG/ML IJ SOLN
INTRAMUSCULAR | Status: AC
  Administered 2020-01-14: 25 mg via INTRAVENOUS
  Filled 2020-01-14: qty 1

## 2020-01-14 MED ORDER — SODIUM CHLORIDE 0.9 % IV SOLN
25.0000 mg | INTRAVENOUS | Status: DC
  Administered 2020-01-14: 25 mg via INTRAVENOUS
  Filled 2020-01-14: qty 1

## 2020-01-15 ENCOUNTER — Emergency Department (HOSPITAL_BASED_OUTPATIENT_CLINIC_OR_DEPARTMENT_OTHER)
Admission: EM | Admit: 2020-01-15 | Discharge: 2020-01-15 | Disposition: A | Discharge status: Home / Self Care | Payer: 59 | Attending: Emergency Medicine | Admitting: Emergency Medicine

## 2020-01-15 ENCOUNTER — Encounter (HOSPITAL_BASED_OUTPATIENT_CLINIC_OR_DEPARTMENT_OTHER): Payer: Self-pay

## 2020-01-15 ENCOUNTER — Other Ambulatory Visit: Payer: Self-pay

## 2020-01-15 DIAGNOSIS — Z3A32 32 weeks gestation of pregnancy: Secondary | ICD-10-CM

## 2020-01-15 DIAGNOSIS — Z79899 Other long term (current) drug therapy: Secondary | ICD-10-CM | POA: Diagnosis not present

## 2020-01-15 DIAGNOSIS — Z3400 Encounter for supervision of normal first pregnancy, unspecified trimester: Secondary | ICD-10-CM

## 2020-01-15 DIAGNOSIS — J45909 Unspecified asthma, uncomplicated: Secondary | ICD-10-CM | POA: Insufficient documentation

## 2020-01-15 DIAGNOSIS — O210 Mild hyperemesis gravidarum: Secondary | ICD-10-CM | POA: Insufficient documentation

## 2020-01-15 DIAGNOSIS — R102 Pelvic and perineal pain: Secondary | ICD-10-CM

## 2020-01-15 DIAGNOSIS — O219 Vomiting of pregnancy, unspecified: Secondary | ICD-10-CM | POA: Diagnosis present

## 2020-01-15 DIAGNOSIS — O26893 Other specified pregnancy related conditions, third trimester: Secondary | ICD-10-CM

## 2020-01-15 LAB — CBC WITH DIFFERENTIAL/PLATELET
Abs Immature Granulocytes: 0.2 10*3/uL — ABNORMAL HIGH (ref 0.00–0.07)
Basophils Absolute: 0 10*3/uL (ref 0.0–0.1)
Basophils Relative: 0 %
Eosinophils Absolute: 0.4 10*3/uL (ref 0.0–0.5)
Eosinophils Relative: 3 %
HCT: 28.2 % — ABNORMAL LOW (ref 36.0–46.0)
Hemoglobin: 9.2 g/dL — ABNORMAL LOW (ref 12.0–15.0)
Immature Granulocytes: 1 %
Lymphocytes Relative: 11 %
Lymphs Abs: 1.5 10*3/uL (ref 0.7–4.0)
MCH: 26.7 pg (ref 26.0–34.0)
MCHC: 32.6 g/dL (ref 30.0–36.0)
MCV: 81.7 fL (ref 80.0–100.0)
Monocytes Absolute: 0.8 10*3/uL (ref 0.1–1.0)
Monocytes Relative: 6 %
Neutro Abs: 11.5 10*3/uL — ABNORMAL HIGH (ref 1.7–7.7)
Neutrophils Relative %: 79 %
Platelets: 323 10*3/uL (ref 150–400)
RBC: 3.45 MIL/uL — ABNORMAL LOW (ref 3.87–5.11)
RDW: 12.9 % (ref 11.5–15.5)
WBC: 14.4 10*3/uL — ABNORMAL HIGH (ref 4.0–10.5)
nRBC: 0 % (ref 0.0–0.2)

## 2020-01-15 LAB — URINALYSIS, ROUTINE W REFLEX MICROSCOPIC
Bilirubin Urine: NEGATIVE
Glucose, UA: NEGATIVE mg/dL
Hgb urine dipstick: NEGATIVE
Ketones, ur: NEGATIVE mg/dL
Leukocytes,Ua: NEGATIVE
Nitrite: NEGATIVE
Protein, ur: NEGATIVE mg/dL
Specific Gravity, Urine: 1.01 (ref 1.005–1.030)
pH: 7 (ref 5.0–8.0)

## 2020-01-15 LAB — COMPREHENSIVE METABOLIC PANEL WITH GFR
ALT: 13 U/L (ref 0–44)
AST: 16 U/L (ref 15–41)
Albumin: 2.6 g/dL — ABNORMAL LOW (ref 3.5–5.0)
Alkaline Phosphatase: 92 U/L (ref 38–126)
Anion gap: 8 (ref 5–15)
BUN: 5 mg/dL — ABNORMAL LOW (ref 6–20)
CO2: 18 mmol/L — ABNORMAL LOW (ref 22–32)
Calcium: 8.4 mg/dL — ABNORMAL LOW (ref 8.9–10.3)
Chloride: 108 mmol/L (ref 98–111)
Creatinine, Ser: 0.3 mg/dL — ABNORMAL LOW (ref 0.44–1.00)
GFR calc Af Amer: >60 mL/min
GFR calc non Af Amer: >60 mL/min
Glucose, Bld: 95 mg/dL (ref 70–99)
Potassium: 3.6 mmol/L (ref 3.5–5.1)
Sodium: 134 mmol/L — ABNORMAL LOW (ref 135–145)
Total Bilirubin: 0.6 mg/dL (ref 0.3–1.2)
Total Protein: 6 g/dL — ABNORMAL LOW (ref 6.5–8.1)

## 2020-01-15 MED ORDER — DIPHENHYDRAMINE HCL 50 MG/ML IJ SOLN
25.0000 mg | Freq: Once | INTRAMUSCULAR | Status: AC
  Administered 2020-01-15: 25 mg via INTRAVENOUS
  Filled 2020-01-15: qty 1

## 2020-01-15 MED ORDER — LACTATED RINGERS IV BOLUS
1000.0000 mL | Freq: Once | INTRAVENOUS | Status: AC
  Administered 2020-01-15: 1000 mL via INTRAVENOUS

## 2020-01-15 MED ORDER — ONDANSETRON 4 MG PO TBDP: ORAL_TABLET | ORAL | 0 refills | Status: DC

## 2020-01-15 MED ORDER — PROMETHAZINE HCL 25 MG/ML IJ SOLN
25.0000 mg | Freq: Once | INTRAMUSCULAR | Status: AC
  Administered 2020-01-15: 13:00:00 25 mg via INTRAVENOUS
  Filled 2020-01-15: qty 1

## 2020-01-15 NOTE — ED Notes (Addendum)
ED Provider at bedside. Pt reports feeling of being "antsy"

## 2020-01-15 NOTE — ED Notes (Signed)
IV attempted x2 without success.

## 2020-01-15 NOTE — ED Provider Notes (Signed)
[facility] EMERGENCY DEPARTMENT Provider Note   CSN: [id number] Arrival date & time: 01/15/20  1214     History Chief Complaint  Patient presents with  . Emesis During Pregnancy    Michelle Vincent is a 29 y.o. female.  Michelle Vincent is a 29 y.o. female who is currently [redacted] weeks pregnant with her first pregnancy, with history of asthma, tachycardia, migraines, SVT, who presents to the emergency department for evaluation of persistent nausea and vomiting and some generalized abdominal cramping.  Patient states that she has hyperemesis gravidarum that has persisted throughout her entire pregnancy, she gets infusions of LR, Phenergan and Benadryl 3 days a week and last had an infusion yesterday.  She reports after her infusion she was feeling well and was able to eat and drink but woke up in the middle of the night with vomiting and has had 5-7 episodes of emesis since then and has not been able to keep down any fluids or food today.  She describes some intermittent cramping across her abdomen that is not persistent, she does not think this feels like contractions.  She states that on Monday she had a small amount of brown fluid but has not had any additional leakage of fluids or vaginal bleeding.  States she discussed this with her OB/GYN who said to just keep a close eye on it.  She reports over the past 2 days her heart rate has been a bit higher than usual as well which has been a frequent issue during this pregnancy.  She denies any associated chest pain or shortness of breath.  No lower extremity pain or swelling.  She did not call her OB/GYN prior to coming to the ED today.        Past Medical History:  Diagnosis Date  . Anemia   . Anxiety    takes lexapro  . Asthma    prn inhaler use  . HA (headache)   . Migraine   . Occipital neuralgia   . Sinus tachycardia   . SVT (supraventricular tachycardia) [facility])     Patient Active Problem List   Diagnosis Date  Noted  . Gallbladder polyp 12/25/2019  . Pelvic pain affecting pregnancy in third trimester, antepartum 12/15/2019  . Hyperemesis gravidarum 08/29/2019  . Supervision of normal first pregnancy, antepartum 08/12/2019  . Allergic rhinitis 08/12/2019  . Tachycardia 03/03/2019  . 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 Mild intermittent asthma without complication 12/27/2017  . Chronic migraine without aura without status migrainosus, not intractable 05/24/2017  . 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 shoulder Right   . TONSILLECTOMY AND ADENOIDECTOMY    . tubes in ears       OB History    Gravida  1   Para  0   Term  0   Preterm  0   AB  0   Living  0     SAB  0   TAB  0   Ectopic  0   Multiple  0   Live Births  0           Family History  Problem Relation Age of Onset  . Hypertension Mother   . Transient ischemic attack Mother   . Diabetes Brother        Type 1  . Breast cancer Maternal Aunt   . Cancer Maternal Aunt     Social History   Tobacco Use  .  Smoking status: Never Smoker  . Smokeless tobacco: Never Used  Substance Use Topics  . Alcohol use: Not Currently  . Drug use: Never    Home Medications Prior to Admission medications   Medication Sig Start Date End Date Taking? Authorizing Provider  Butalbital-APAP-Caffeine 50-325-40 MG capsule Take 1-2 capsules by mouth every 6 (six) hours as needed for headache. 10/10/19   Glyn Ade, Scot Jun, PA-C  cyclobenzaprine (FLEXERIL) 10 MG tablet Take 1 tablet (10 mg total) by mouth 3 (three) times daily as needed for muscle spasms. 10/10/19   Bertram Denver, PA-C  Elastic Bandages & Supports (COMFORT FIT MATERNITY SUPP SM) MISC 1 Units by Does not apply route daily as needed. 11/08/19   Nugent, Odie Sera, NP  escitalopram (LEXAPRO) 10 MG tablet Take 1 tablet (10 mg total) by mouth daily. 09/09/19   Levie Heritage, DO  famotidine  (PEPCID) 20 MG tablet Take 1 tablet (20 mg total) by mouth 2 (two) times daily. 09/09/19   Levie Heritage, DO  ferrous sulfate 325 (65 FE) MG tablet Take 325 mg by mouth 3 (three) times daily. 12/04/14   [provider]  Magnesium 400 MG TABS Take 400 mg by mouth daily. Patient not taking: Reported on 10/22/2019 09/29/19   Jacalyn Lefevre, MD  metoCLOPramide (REGLAN) 10 MG tablet Take 1 tablet (10 mg total) by mouth 3 (three) times daily before meals. Patient not taking: Reported on 11/19/2019 09/15/19   Rasch, Victorino Dike I, NP  ondansetron (ZOFRAN ODT) 4 MG disintegrating tablet 4mg  ODT q4 hours prn nausea/vomit 01/15/20   03/16/20, PA-C  ondansetron (ZOFRAN) 8 MG tablet Take 1 tablet (8 mg total) by mouth every 8 (eight) hours as needed for nausea or vomiting. 11/19/19   11/21/19, MD  pantoprazole (PROTONIX) 40 MG tablet Take 1 tablet (40 mg total) by mouth daily. 12/26/19   12/28/19, DO  Prenatal Vit-Fe Fumarate-FA (PRENATAL MULTIVITAMIN) TABS tablet Take 1 tablet by mouth daily at 12 noon.    [provider]  promethazine (PHENERGAN) 25 MG suppository Place 1 suppository (25 mg total) rectally every 6 (six) hours as needed for nausea or vomiting. Patient not taking: Reported on 12/12/2019 09/23/19   09/25/19, DO  propranolol (INDERAL) 10 MG tablet Take 2 tablets (20 mg total) by mouth 2 (two) times daily. 01/08/20   03/09/20, DO  zolpidem (AMBIEN) 5 MG tablet Take 1 tablet (5 mg total) by mouth at bedtime as needed for sleep. 11/19/19   11/21/19, MD    Allergies    Nitrous oxide, Peach flavor, Shellfish allergy, Adhesive [tape], and Morphine and related  Review of Systems   Review of Systems  Constitutional: Negative for chills and fever.  HENT: Negative.   Eyes: Negative for visual disturbance.  Respiratory: Negative for cough and shortness of breath.   Cardiovascular: Positive for palpitations. Negative for chest pain.    Gastrointestinal: Positive for abdominal pain, nausea and vomiting. Negative for constipation and diarrhea.  Genitourinary: Negative for dysuria, frequency, vaginal bleeding and vaginal discharge.  Musculoskeletal: Negative for arthralgias and myalgias.  Skin: Negative for color change and rash.  Neurological: Negative for dizziness, syncope and light-headedness.    Physical Exam Updated Vital Signs BP 115/73 (BP Location: Right Arm)   Pulse 97   Temp 98.2 F (36.8 C) (Oral)   Resp 20   Ht 5\' 7"  (1.702 m)   Wt 87.8 kg   LMP 06/01/2019  SpO2 100%   BMI 30.32 kg/m   Physical Exam Vitals and nursing note reviewed.  Constitutional:      General: She is not in acute distress.    Appearance: Normal appearance. She is well-developed and normal weight. She is not ill-appearing or diaphoretic.     Comments: Alert , and in no acute distress, no active vomiting  HENT:     Head: Normocephalic and atraumatic.     Mouth/Throat:     Mouth: Mucous membranes are moist.     Pharynx: Oropharynx is clear.  Eyes:     General:        Right eye: No discharge.        Left eye: No discharge.  Cardiovascular:     Rate and Rhythm: Regular rhythm. Tachycardia present.     Heart sounds: Normal heart sounds. No murmur. No friction rub. No gallop.      Comments: Mildly tachycardic on arrival with normal rhythm Pulmonary:     Effort: Pulmonary effort is normal. No respiratory distress.     Breath sounds: Normal breath sounds. No wheezing or rales.     Comments: Respirations equal and unlabored, patient able to speak in full sentences, lungs clear to auscultation bilaterally Abdominal:     General: Bowel sounds are normal.     Palpations: Abdomen is soft. There is no mass.     Tenderness: There is no abdominal tenderness. There is no guarding.     Comments: Gravid abdomen with appropriate fundal height, nontender to palpation  Musculoskeletal:        General: No deformity.     Cervical back:  Neck supple.  Skin:    General: Skin is warm and dry.     Capillary Refill: Capillary refill takes less than 2 seconds.  Neurological:     Mental Status: She is alert.     Coordination: Coordination normal.     Comments: Speech is clear, able to follow commands Moves extremities without ataxia, coordination intact  Psychiatric:        Mood and Affect: Mood normal.        Behavior: Behavior normal.     ED Results / Procedures / Treatments   Labs (all labs ordered are listed, but only abnormal results are displayed) Labs Reviewed  COMPREHENSIVE METABOLIC PANEL - Abnormal; Notable for the following components:      Result Value   Sodium 134 (*)    CO2 18 (*)    BUN 5 (*)    Creatinine, Ser 0.30 (*)    Calcium 8.4 (*)    Total Protein 6.0 (*)    Albumin 2.6 (*)    All other components within normal limits  CBC WITH DIFFERENTIAL/PLATELET - Abnormal; Notable for the following components:   WBC 14.4 (*)    RBC 3.45 (*)    Hemoglobin 9.2 (*)    HCT 28.2 (*)    Neutro Abs 11.5 (*)    Abs Immature Granulocytes 0.20 (*)    All other components within normal limits  URINALYSIS, ROUTINE W REFLEX MICROSCOPIC    EKG EKG Interpretation  Date/Time:  Thursday January 15 2020 15:59:11 EST Ventricular Rate:  84 PR Interval:    QRS Duration: 78 QT Interval:  376 QTC Calculation: 445 R Axis:   73 Text Interpretation: Sinus rhythm Borderline T abnormalities, inferior leads T wave changes in similar distribution compared to Jan 07 2020; tachycardia resolved Confirmed by Sherwood Gambler [phone]) on 01/15/2020 4:02:03 PM  Radiology No results found.  Procedures Procedures (including critical care time)  Medications Ordered in ED Medications  lactated ringers bolus 1,000 mL (0 mLs Intravenous Stopped 01/15/20 1414)  promethazine (PHENERGAN) injection 25 mg (25 mg Intravenous Given 01/15/20 1311)  diphenhydrAMINE (BENADRYL) injection 25 mg (25 mg Intravenous Given 01/15/20 1311)   diphenhydrAMINE (BENADRYL) injection 25 mg (25 mg Intravenous Given 01/15/20 1501)    ED Course  I have reviewed the triage vital signs and the nursing notes.  Pertinent labs & imaging results that were available during my care of the patient were reviewed by me and considered in my medical decision making (see chart for details).  MDM Rules/Calculators/[name]                     29 year old female who is [redacted] weeks pregnant has been dealing with severe nausea and vomiting throughout her pregnancy, receives infusions with Phenergan and Benadryl 3 days a week presenting with breakthrough nausea and vomiting that she could not control at home after her infusion yesterday.  On arrival she is tachycardic but well-appearing and in no distress with no active vomiting.  Gravid abdomen with appropriate fundal height, patient reports feeling good fetal movement.  Placed on fetal heart monitoring toco monitor and rapid OB contacted.  Normal fetal heart rate, and no contractions noted on monitoring.  Patient cleared from OB rapid response and is okay to come off of the [facility] monitor.  Patient treated with LR bolus, Phenergan and Benadryl and basic labs ordered.  Lab work shows slight worsening in CO2 at 18 usually 19-21, but normal potassium and no other significant electrolyte derangements, stable anemia, leukocytosis of 14.4 which is only slightly increased from recent.  UA without evidence of infection.  On reevaluation patient reports improvement in nausea, she does feel little jittery and antsy, suspect she may be having a mild dystonic reaction from Phenergan, this improved with an additional dose of Benadryl.  I discussed case with on-call OB/GYN who recommended checking QT on EKG and if normal adding some ODT Zofran at home for breakthrough nausea and vomiting and encourage patient to start taking B6 again.  QT is appropriate, patient will be prescribed Zofran, she is tolerating p.o. food and fluids here in  the emergency department feeling much better, tachycardia has resolved.  Discharged home in good condition, instructed to call clinic tomorrow regarding scheduled infusion.  Patient expresses understanding and agreement with plan.  Final Clinical Impression(s) / ED Diagnoses Final diagnoses:  Hyperemesis gravidarum  [redacted] weeks gestation of pregnancy    Rx / DC Orders ED Discharge Orders         Ordered    ondansetron (ZOFRAN ODT) 4 MG disintegrating tablet     01/15/20 1652           Dartha Lodge, [place] 01/16/20 1346    Terrilee Files, MD 01/16/20 1726

## 2020-01-15 NOTE — ED Triage Notes (Signed)
Pt c/o cont'd vomiting-states she gets infusions per OB orders for same with the last infusion yesterday-states she did not contact her OB-pt is [redacted] weeks pregnant-NAD-steady gait

## 2020-01-15 NOTE — Discharge Instructions (Signed)
Use Zofran as needed for breakthrough nausea and vomiting at home.  Please begin taking B6, follow-up closely with your OB/GYN as planned, call them in the morning before your next infusion and let them know you were seen in the emergency department.  Return to the ED for any new or concerning symptoms.

## 2020-01-15 NOTE — Progress Notes (Signed)
1240 Received call regarding this G1P0 @ 32.[redacted] wks GA in with report of hx of hyperemesis gravidarum, infusion yesterday, and continued nausea/ vomiting today. No report of UC's, vaginal bleeding, or LOF from vagina. 1252 Dr. Alysia Penna notified of above. Orders for 20 min NST and EFM can be removed and patient will be OB cleared unless abnormalities are noted.

## 2020-01-15 NOTE — ED Notes (Signed)
Erin, rapid response RN notified of pt.

## 2020-01-15 NOTE — ED Notes (Signed)
Per Rapid response, pt is clear to come off toco monitor.

## 2020-01-15 NOTE — ED Notes (Signed)
ED Provider at bedside. 

## 2020-01-16 ENCOUNTER — Encounter (HOSPITAL_COMMUNITY)
Admission: RE | Admit: 2020-01-16 | Discharge: 2020-01-16 | Disposition: A | Discharge status: Home / Self Care | Payer: 59 | Source: Ambulatory Visit | Attending: Family Medicine | Admitting: Family Medicine

## 2020-01-16 MED ORDER — DIPHENHYDRAMINE HCL 50 MG/ML IJ SOLN
INTRAMUSCULAR | Status: AC
  Filled 2020-01-16: qty 1

## 2020-01-16 MED ORDER — DIPHENHYDRAMINE HCL 25 MG PO CAPS
ORAL_CAPSULE | ORAL | Status: AC
  Filled 2020-01-16: qty 1

## 2020-01-16 MED ORDER — SODIUM CHLORIDE 0.9 % IV SOLN
25.0000 mg | INTRAVENOUS | Status: DC
  Administered 2020-01-16: 25 mg via INTRAVENOUS
  Filled 2020-01-16: qty 1

## 2020-01-16 MED ORDER — DIPHENHYDRAMINE HCL 50 MG/ML IJ SOLN
25.0000 mg | INTRAMUSCULAR | Status: DC
  Administered 2020-01-16: 25 mg via INTRAVENOUS

## 2020-01-19 ENCOUNTER — Other Ambulatory Visit: Payer: Self-pay

## 2020-01-19 ENCOUNTER — Telehealth: Payer: Self-pay

## 2020-01-19 ENCOUNTER — Encounter (HOSPITAL_COMMUNITY)
Admission: RE | Admit: 2020-01-19 | Discharge: 2020-01-19 | Disposition: A | Discharge status: Home / Self Care | Payer: 59 | Source: Ambulatory Visit | Attending: Family Medicine | Admitting: Family Medicine

## 2020-01-19 MED ORDER — DIPHENHYDRAMINE HCL 50 MG/ML IJ SOLN
INTRAMUSCULAR | Status: AC
  Administered 2020-01-19: 11:00:00 25 mg via INTRAVENOUS
  Filled 2020-01-19: qty 1

## 2020-01-19 MED ORDER — SODIUM CHLORIDE 0.9 % IV SOLN
25.0000 mg | INTRAVENOUS | Status: DC
  Administered 2020-01-19: 25 mg via INTRAVENOUS
  Filled 2020-01-19: qty 1

## 2020-01-19 MED ORDER — DIPHENHYDRAMINE HCL 50 MG/ML IJ SOLN: 25.0000 mg | INTRAMUSCULAR | Status: DC

## 2020-01-19 NOTE — Progress Notes (Signed)
Cardiology Office Note   Date:  01/21/2020   ID:  Michelle Vincent, DOB 09/15/1991, MRN [id number]  PCP:  Alfonso Ellis, NP  Cardiologist: Dr. Percival Spanish  CC: Follow Up   History of Present Illness: Michelle Vincent is a 29 y.o. female who presents for ongoing assessment and management of palpitations. She had a cardiac monitor which revealed average HR of 98 bpm, lowest HR of 60, and highest HR of 185. She was last seen by Dr. Percival Spanish on 07/25/2019,she was [redacted] weeks pregnant with first child ( a son, due in May) She was to take propranolol prn and to stay hydrated.   She is here today with continued symptoms.  Her OB/GYN increased her propanolol to 40 mg daily from 20 mg daily.  At one point she was taken off propanolol and placed on labetalol with significant decrease in blood pressure.  She is now back on the propanolol as directed.  She states that she still feels her heart racing at times.  Usually when she is up walking around without significant exertion.  She sometimes feels that at rest.  Past Medical History:  Diagnosis Date  . Anemia   . Anxiety    takes lexapro  . Asthma    prn inhaler use  . HA (headache)   . Migraine   . Occipital neuralgia   . Sinus tachycardia   . SVT (supraventricular tachycardia) (HCC)     Past Surgical History:  Procedure Laterality Date  . shoulder Right   . TONSILLECTOMY AND ADENOIDECTOMY    . tubes in ears       Current Outpatient Medications  Medication Sig Dispense Refill  . Butalbital-APAP-Caffeine 50-325-40 MG capsule Take 1-2 capsules by mouth every 6 (six) hours as needed for headache. 30 capsule 3  . cyclobenzaprine (FLEXERIL) 10 MG tablet Take 1 tablet (10 mg total) by mouth 3 (three) times daily as needed for muscle spasms. 40 tablet 3  . Elastic Bandages & Supports (COMFORT FIT MATERNITY SUPP SM) MISC 1 Units by Does not apply route daily as needed. 1 each 0  . escitalopram (LEXAPRO) 10 MG tablet TAKE 1 TABLET BY MOUTH EVERY  DAY 30 tablet 3  . famotidine (PEPCID) 20 MG tablet TAKE 1 TABLET BY MOUTH TWICE A DAY 60 tablet 3  . ferrous sulfate 325 (65 FE) MG tablet Take 325 mg by mouth 3 (three) times daily.    . ondansetron (ZOFRAN ODT) 4 MG disintegrating tablet 4mg  ODT q4 hours prn nausea/vomit 10 tablet 0  . ondansetron (ZOFRAN) 8 MG tablet Take 1 tablet (8 mg total) by mouth every 8 (eight) hours as needed for nausea or vomiting. 20 tablet 2  . pantoprazole (PROTONIX) 40 MG tablet Take 1 tablet (40 mg total) by mouth daily. 30 tablet 3  . Prenatal Vit-Fe Fumarate-FA (PRENATAL MULTIVITAMIN) TABS tablet Take 1 tablet by mouth daily at 12 noon.    . promethazine (PHENERGAN) 25 MG suppository Place 1 suppository (25 mg total) rectally every 6 (six) hours as needed for nausea or vomiting. 60 each 5  . propranolol (INDERAL) 10 MG tablet Take 2 tablets (20 mg total) by mouth 2 (two) times daily. 120 tablet 3  . zolpidem (AMBIEN) 5 MG tablet Take 1 tablet (5 mg total) by mouth at bedtime as needed for sleep. 30 tablet 1   Current Facility-Administered Medications  Medication Dose Route Frequency Provider Last Rate Last Admin  . diphenhydrAMINE (BENADRYL) injection 25 mg  25 mg Intravenous Q6H PRN  Adam Phenix, MD       Facility-Administered Medications Ordered in Other Visits  Medication Dose Route Frequency Provider Last Rate Last Admin  . diphenhydrAMINE (BENADRYL) injection 25 mg  25 mg Intravenous Once per day on Mon Wed Fri Levie Heritage, DO   25 mg at 01/21/20 1040  . promethazine (PHENERGAN) 25 mg in sodium chloride 0.9 % 1,000 mL infusion  25 mg Intravenous Once per day on Mon Wed Fri Stinson, Jacob J, DO   Stopped at 01/21/20 1146    Allergies:   Nitrous oxide, Peach flavor, Shellfish allergy, Adhesive [tape], and Morphine and related    Social History:  The patient  reports that she has never smoked. She has never used smokeless tobacco. She reports previous alcohol use. She reports that she does not  use drugs.   Family History:  The patient's family history includes Breast cancer in her maternal aunt; Cancer in her maternal aunt; Diabetes in her brother; Hypertension in her mother; Transient ischemic attack in her mother.    ROS: All other systems are reviewed and negative. Unless otherwise mentioned in H&P    PHYSICAL EXAM: VS:  BP 100/66   Pulse 89   Ht 5\' 7"  (1.702 m)   Wt 193 lb (87.5 kg)   LMP 06/01/2019   SpO2 99%   BMI 30.23 kg/m  , BMI Body mass index is 30.23 kg/m. GEN: Well nourished, well developed, in no acute distress HEENT: normal Neck: no JVD, carotid bruits, or masses Cardiac: RRR; no murmurs, rubs, or gallops,no edema  Respiratory:  Clear to auscultation bilaterally, normal work of breathing GI: soft, nontender, nondistended, + BS.  8 months pregnant MS: no deformity or atrophy Skin: warm and dry, no rash Neuro:  Strength and sensation are intact Psych: euthymic mood, full affect   EKG: Not completed this office visit  Recent Labs: 09/15/2019: TSH 0.489 01/15/2020: ALT 13; BUN 5; Creatinine, Ser 0.30; Hemoglobin 9.2; Platelets 323; Potassium 3.6; Sodium 134    Lipid Panel No results found for: CHOL, TRIG, HDL, CHOLHDL, VLDL, LDLCALC, LDLDIRECT    Wt Readings from Last 3 Encounters:  01/21/20 193 lb (87.5 kg)  01/21/20 188 lb (85.3 kg)  01/19/20 188 lb (85.3 kg)      Other studies Reviewed: Holter monitor as above   ASSESSMENT AND PLAN:  1.  Inappropriate tachycardia: Having episodic periods of rapid heart rate which she counted 130 bpm with and without exertion.  She has had increased dose on propanolol to 40 mg daily.  She did not tolerate labetalol causing significant hypotension.  Would not make any changes at this time she is approximately 5 to 6 weeks from delivery date.  Once she has delivered, may have more room to adjust medications and make further recommendations.  She remains hydrated.  She is avoiding caffeine or any other  stimulants.  2.  Iron deficiency anemia: Found by PCP and OB/GYN.  She is on iron supplements with most recent hemoglobin of 9.2/hematocrit 28.2.  She is being considered for iron infusions.   Current medicines are reviewed at length with the patient today.  I have spent 20 minutes dedicated to the care of this patient on the date of this encounter to include pre-visit review of records, assessment, management and diagnostic testing,with shared decision making.  Labs/ tests ordered today include: None  01/21/20. Bettey Mare, ANP, AACC   01/21/2020 1:52 PM    [facility] [address]  [address] Office [phone] Fax [phone]  Notice: This dictation was prepared with Dragon dictation along with smaller phrase technology. Any transcriptional errors that result from this process are unintentional and may not be corrected upon review.

## 2020-01-19 NOTE — Telephone Encounter (Signed)
Christian from [facility] called stating a new order was placed for pt's Phenergan to be mixed with Saline instead of LR. Verbal order was given for nurse to use most recent order sent in by Dr. Candelaria Celeste. Understanding was voiced. Heike Pounds l Koron Godeaux, CMA

## 2020-01-19 NOTE — Progress Notes (Signed)
Order questioned by pharmacist.  Previous order sets the phenergan was ordered to be given in LR and this order set has the phenergan in Normal Saline.  I called Dr Denyce Robert office who stated he was not there today but to go with the order he placed.  I alerted the patient to just take note if she has any increased swelling and if she does to let Dr Adrian Blackwater know.  Pt verbalized understanding.

## 2020-01-21 ENCOUNTER — Ambulatory Visit: Payer: 59 | Admitting: Adult Health

## 2020-01-21 ENCOUNTER — Other Ambulatory Visit: Payer: Self-pay

## 2020-01-21 ENCOUNTER — Other Ambulatory Visit: Payer: Self-pay | Admitting: Family Medicine

## 2020-01-21 ENCOUNTER — Encounter (HOSPITAL_COMMUNITY)
Admission: RE | Admit: 2020-01-21 | Discharge: 2020-01-21 | Disposition: A | Discharge status: Home / Self Care | Payer: 59 | Source: Ambulatory Visit | Attending: Family Medicine | Admitting: Family Medicine

## 2020-01-21 ENCOUNTER — Encounter: Payer: Self-pay | Admitting: Adult Health

## 2020-01-21 VITALS — BP 100/66 | HR 89 | Ht 67.0 in | Wt 193.0 lb

## 2020-01-21 DIAGNOSIS — R000 Tachycardia, unspecified: Secondary | ICD-10-CM | POA: Diagnosis not present

## 2020-01-21 DIAGNOSIS — R002 Palpitations: Secondary | ICD-10-CM | POA: Diagnosis not present

## 2020-01-21 DIAGNOSIS — O99019 Anemia complicating pregnancy, unspecified trimester: Secondary | ICD-10-CM | POA: Diagnosis not present

## 2020-01-21 DIAGNOSIS — D509 Iron deficiency anemia, unspecified: Secondary | ICD-10-CM | POA: Diagnosis not present

## 2020-01-21 MED ORDER — PROMETHAZINE HCL 25 MG/ML IJ SOLN
INTRAMUSCULAR | Status: AC
  Filled 2020-01-21: qty 1

## 2020-01-21 MED ORDER — DIPHENHYDRAMINE HCL 50 MG/ML IJ SOLN
INTRAMUSCULAR | Status: AC
  Administered 2020-01-21: 25 mg via INTRAVENOUS
  Filled 2020-01-21: qty 1

## 2020-01-21 MED ORDER — SODIUM CHLORIDE 0.9 % IV SOLN
25.0000 mg | INTRAVENOUS | Status: DC
  Administered 2020-01-21: 25 mg via INTRAVENOUS
  Filled 2020-01-21: qty 1

## 2020-01-21 MED ORDER — DIPHENHYDRAMINE HCL 50 MG/ML IJ SOLN: 25.0000 mg | INTRAMUSCULAR | Status: DC

## 2020-01-21 NOTE — Patient Instructions (Signed)
Medication Instructions:  Continue current medications  *If you need a refill on your cardiac medications before your next appointment, please call your pharmacy*   Lab Work: None ordered   Testing/Procedures: None Orderdf   Follow-Up: At [facility], you and your health needs are our priority.  As part of our continuing mission to provide you with exceptional heart care, we have created designated Provider Care Teams.  These Care Teams include your primary Cardiologist (physician) and Advanced Practice Providers (APPs -  Physician Assistants and Nurse Practitioners) who all work together to provide you with the care you need, when you need it.  We recommend signing up for the patient portal called "MyChart".  Sign up information is provided on this After Visit Summary.  MyChart is used to connect with patients for Virtual Visits (Telemedicine).  Patients are able to view lab/test results, encounter notes, upcoming appointments, etc.  Non-urgent messages can be sent to your provider as well.   To learn more about what you can do with MyChart, go to [email or web address].    Your next appointment:   5 month(s)  The format for your next appointment:   In Person  Provider:   You may see Rollene Rotunda, MD or one of the following Advanced Practice Providers on your designated Care Team:    Theodore Demark, PA-C  Joni Reining, DNP, ANP  Cadence Fransico Michael, NP

## 2020-01-22 ENCOUNTER — Ambulatory Visit (INDEPENDENT_AMBULATORY_CARE_PROVIDER_SITE_OTHER): Payer: 59 | Admitting: Family Medicine

## 2020-01-22 ENCOUNTER — Ambulatory Visit (HOSPITAL_BASED_OUTPATIENT_CLINIC_OR_DEPARTMENT_OTHER)
Admission: RE | Admit: 2020-01-22 | Discharge: 2020-01-22 | Disposition: A | Discharge status: Home / Self Care | Payer: 59 | Source: Ambulatory Visit | Attending: Family Medicine | Admitting: Family Medicine

## 2020-01-22 VITALS — BP 119/73 | HR 107 | Wt 197.0 lb

## 2020-01-22 DIAGNOSIS — O210 Mild hyperemesis gravidarum: Secondary | ICD-10-CM

## 2020-01-22 DIAGNOSIS — M79672 Pain in left foot: Secondary | ICD-10-CM | POA: Diagnosis present

## 2020-01-22 DIAGNOSIS — R000 Tachycardia, unspecified: Secondary | ICD-10-CM

## 2020-01-22 DIAGNOSIS — Z3400 Encounter for supervision of normal first pregnancy, unspecified trimester: Secondary | ICD-10-CM

## 2020-01-22 DIAGNOSIS — O26899 Other specified pregnancy related conditions, unspecified trimester: Secondary | ICD-10-CM

## 2020-01-22 DIAGNOSIS — G5600 Carpal tunnel syndrome, unspecified upper limb: Secondary | ICD-10-CM

## 2020-01-22 DIAGNOSIS — J4520 Mild intermittent asthma, uncomplicated: Secondary | ICD-10-CM

## 2020-01-22 DIAGNOSIS — Z3A33 33 weeks gestation of pregnancy: Secondary | ICD-10-CM

## 2020-01-22 DIAGNOSIS — O99013 Anemia complicating pregnancy, third trimester: Secondary | ICD-10-CM

## 2020-01-22 MED ORDER — WRIST SPLINT/COCK-UP/RIGHT M MISC: 1.0000 [IU] | Freq: Every day | 0 refills | Status: DC

## 2020-01-22 NOTE — Progress Notes (Addendum)
PRENATAL VISIT NOTE  Subjective:  Michelle Vincent is a 29 y.o. G1P0000 at 36w4dbeing seen today for ongoing prenatal care.  She is currently monitored for the following issues for this low-risk pregnancy and has Tachycardia; Supervision of normal first pregnancy, antepartum; Inappropriate sinus tachycardia; Allergic rhinitis; Chronic migraine without aura without status migrainosus, not intractable; Mild intermittent asthma without complication; Shift work sleep disorder; Hyperemesis gravidarum; Pelvic pain affecting pregnancy in third trimester, antepartum; and Gallbladder polyp on their problem list.  Patient reports carpal tunnel symptoms, heartburn, nausea, no bleeding, no contractions, no leaking and swelling, foot pain. She is having some constant left foot pain in her heal, worse with ambulation. No injury. Also having some edema. Contractions: Not present.  .  Movement: Present. Denies leaking of fluid.  The following portions of the patient's history were reviewed and updated as appropriate: allergies, current medications, past family history, past medical history, past social history, past surgical history and problem list.   Objective:   Vitals:   01/22/20 1102  BP: 119/73  Pulse: (!) 107  Weight: 197 lb (89.4 kg)    Fetal Status:     Movement: Present     General:  Alert, oriented and cooperative. Patient is in no acute distress. Episode of emesis in office.  Skin: Skin is warm and dry. No rash noted.   Cardiovascular: Normal heart rate noted  Respiratory: Normal respiratory effort, no problems with respiration noted  Abdomen: Soft, gravid, appropriate for gestational age.  Pain/Pressure: Present     Pelvic: Cervical exam deferred        Extremities: Normal range of motion.  Edema: Trace. No tibial malleolar tenderness or midfoot tenderness. Tenderness round medial ligaments of left foot.  Mental Status: Normal mood and affect. Normal behavior. Normal judgment and  thought content.   Assessment and Plan:  Pregnancy: G1P0000 at 329w4d. Supervision of normal first pregnancy, antepartum FHT and FH normal. Has low protein and albumin on CMP, recommend continue to try to increase protein in diet   2. Hyperemesis gravidarum Getting infusions with phenergan 3x/week (M/W/F). Finds them helpful, but has vomiting between infusions. Zofran is helpful when patient can keep them down - Comp Met (CMET) - CBC  3. Mild intermittent asthma without complication Not discussed this visit.  4. Inappropriate sinus tachycardia Currently taking propanolol 20 mg twice a day. Met with cardiologist 01/21/2020. Plan to f/u with cardiology in approximately 5 months (3 months after pregnancy).  5. Anemia of pregnancy in third trimester Taking iron BID. - CBC  6. Left foot pain Doubtful that she has a stress fracture, given lack of tenderness of midfoot and stress fracture of calcaneous/talus would be less common. Will check xray regardless. Continue rest, ice. [facility] need [facility]; Future  7. Carpal tunnel syndrome during pregnancy -Start using wrist brace at night  8. Gallbladder polyp No evidence of obstruction. Polyps wouldn't be source of pain without obstruction.  Preterm labor symptoms and general obstetric precautions including but not limited to vaginal bleeding, contractions, leaking of fluid and fetal movement were reviewed in detail with the patient. Please refer to After Visit Summary for other counseling recommendations.   Return in about 2 weeks (around 02/05/2020).  Future Appointments  Date Time Provider DeSpencer3/19/2021 11:00 AM [facility] ROOM 12 [facility] None  02/05/2020  4:00 PM StTruett MainlandDO [facility] None    AlBruna PotterMedical Student  Patient  seen in conjunction with Rodrigo Ran, MS3. I was present during the exam and interview. I agree with the above note.   FHT and FH  normal. Carpal tunnel syndrome, worse at night. Will give volar splint. Hyperemesis - continue with infusions and phenergan. Recheck labs today. Tachycardia - mostly controlled with increase dose.  Truett Mainland, DO

## 2020-01-23 ENCOUNTER — Encounter (HOSPITAL_COMMUNITY)
Admission: RE | Admit: 2020-01-23 | Discharge: 2020-01-23 | Disposition: A | Discharge status: Home / Self Care | Payer: 59 | Source: Ambulatory Visit | Attending: Family Medicine | Admitting: Family Medicine

## 2020-01-23 ENCOUNTER — Other Ambulatory Visit: Payer: Self-pay

## 2020-01-23 LAB — CBC
Hematocrit: 30 % — ABNORMAL LOW (ref 34.0–46.6)
Hemoglobin: 9.8 g/dL — ABNORMAL LOW (ref 11.1–15.9)
MCH: 26.1 pg — ABNORMAL LOW (ref 26.6–33.0)
MCHC: 32.7 g/dL (ref 31.5–35.7)
MCV: 80 fL (ref 79–97)
Platelets: 310 10*3/uL (ref 150–450)
RBC: 3.76 x10E6/uL — ABNORMAL LOW (ref 3.77–5.28)
RDW: 13.1 % (ref 11.7–15.4)
WBC: 12.6 10*3/uL — ABNORMAL HIGH (ref 3.4–10.8)

## 2020-01-23 LAB — COMPREHENSIVE METABOLIC PANEL
ALT: 7 IU/L (ref 0–32)
AST: 14 IU/L (ref 0–40)
Albumin/Globulin Ratio: 1.6 (ref 1.2–2.2)
Albumin: 3.6 g/dL — ABNORMAL LOW (ref 3.9–5.0)
Alkaline Phosphatase: 126 IU/L — ABNORMAL HIGH (ref 39–117)
BUN/Creatinine Ratio: 9 (ref 9–23)
BUN: 5 mg/dL — ABNORMAL LOW (ref 6–20)
Bilirubin Total: <0.2 mg/dL (ref 0.0–1.2)
CO2: 19 mmol/L — ABNORMAL LOW (ref 20–29)
Calcium: 9 mg/dL (ref 8.7–10.2)
Chloride: 107 mmol/L — ABNORMAL HIGH (ref 96–106)
Creatinine, Ser: 0.56 mg/dL — ABNORMAL LOW (ref 0.57–1.00)
GFR calc Af Amer: 148 mL/min/{1.73_m2} (ref >59)
GFR calc non Af Amer: 128 mL/min/{1.73_m2} (ref >59)
Globulin, Total: 2.2 g/dL (ref 1.5–4.5)
Glucose: 76 mg/dL (ref 65–99)
Potassium: 4.5 mmol/L (ref 3.5–5.2)
Sodium: 139 mmol/L (ref 134–144)
Total Protein: 5.8 g/dL — ABNORMAL LOW (ref 6.0–8.5)

## 2020-01-23 MED ORDER — DIPHENHYDRAMINE HCL 50 MG/ML IJ SOLN: 25.0000 mg | INTRAMUSCULAR | Status: DC

## 2020-01-23 MED ORDER — DIPHENHYDRAMINE HCL 50 MG/ML IJ SOLN
INTRAMUSCULAR | Status: AC
  Administered 2020-01-23: 25 mg via INTRAVENOUS
  Filled 2020-01-23: qty 1

## 2020-01-23 MED ORDER — SODIUM CHLORIDE 0.9 % IV SOLN
25.0000 mg | INTRAVENOUS | Status: DC
  Administered 2020-01-23: 25 mg via INTRAVENOUS
  Filled 2020-01-23: qty 1

## 2020-01-26 ENCOUNTER — Ambulatory Visit: Payer: 59 | Admitting: Family Medicine

## 2020-01-26 ENCOUNTER — Encounter (HOSPITAL_COMMUNITY)
Admission: RE | Admit: 2020-01-26 | Discharge: 2020-01-26 | Disposition: A | Discharge status: Home / Self Care | Payer: 59 | Source: Ambulatory Visit | Attending: Family Medicine | Admitting: Family Medicine

## 2020-01-26 ENCOUNTER — Ambulatory Visit: Payer: Self-pay

## 2020-01-26 ENCOUNTER — Encounter (HOSPITAL_BASED_OUTPATIENT_CLINIC_OR_DEPARTMENT_OTHER): Payer: Self-pay | Admitting: Emergency Medicine

## 2020-01-26 ENCOUNTER — Other Ambulatory Visit: Payer: Self-pay

## 2020-01-26 ENCOUNTER — Encounter: Payer: Self-pay | Admitting: Family Medicine

## 2020-01-26 ENCOUNTER — Emergency Department (HOSPITAL_BASED_OUTPATIENT_CLINIC_OR_DEPARTMENT_OTHER)
Admission: EM | Admit: 2020-01-26 | Discharge: 2020-01-26 | Disposition: A | Discharge status: Home / Self Care | Payer: 59 | Attending: Emergency Medicine | Admitting: Emergency Medicine

## 2020-01-26 VITALS — BP 126/80 | Ht 67.0 in | Wt 197.0 lb

## 2020-01-26 DIAGNOSIS — M8430XA Stress fracture, unspecified site, initial encounter for fracture: Secondary | ICD-10-CM

## 2020-01-26 DIAGNOSIS — O99891 Other specified diseases and conditions complicating pregnancy: Secondary | ICD-10-CM | POA: Insufficient documentation

## 2020-01-26 DIAGNOSIS — Z3A33 33 weeks gestation of pregnancy: Secondary | ICD-10-CM | POA: Insufficient documentation

## 2020-01-26 DIAGNOSIS — M79672 Pain in left foot: Secondary | ICD-10-CM

## 2020-01-26 DIAGNOSIS — G43009 Migraine without aura, not intractable, without status migrainosus: Secondary | ICD-10-CM

## 2020-01-26 DIAGNOSIS — Z79899 Other long term (current) drug therapy: Secondary | ICD-10-CM | POA: Insufficient documentation

## 2020-01-26 DIAGNOSIS — O99513 Diseases of the respiratory system complicating pregnancy, third trimester: Secondary | ICD-10-CM | POA: Diagnosis not present

## 2020-01-26 DIAGNOSIS — J45909 Unspecified asthma, uncomplicated: Secondary | ICD-10-CM | POA: Insufficient documentation

## 2020-01-26 MED ORDER — PROCHLORPERAZINE EDISYLATE 10 MG/2ML IJ SOLN
10.0000 mg | Freq: Once | INTRAMUSCULAR | Status: AC
  Administered 2020-01-26: 10 mg via INTRAVENOUS
  Filled 2020-01-26: qty 2

## 2020-01-26 MED ORDER — DIPHENHYDRAMINE HCL 50 MG/ML IJ SOLN
25.0000 mg | Freq: Once | INTRAMUSCULAR | Status: AC
  Administered 2020-01-26: 25 mg via INTRAVENOUS
  Filled 2020-01-26: qty 1

## 2020-01-26 MED ORDER — DIPHENHYDRAMINE HCL 50 MG/ML IJ SOLN
25.0000 mg | INTRAMUSCULAR | Status: DC
  Administered 2020-01-26: 25 mg via INTRAVENOUS

## 2020-01-26 MED ORDER — LACTATED RINGERS IV BOLUS
1000.0000 mL | Freq: Once | INTRAVENOUS | Status: AC
  Administered 2020-01-26: 1000 mL via INTRAVENOUS

## 2020-01-26 MED ORDER — SODIUM CHLORIDE 0.9 % IV SOLN
25.0000 mg | INTRAVENOUS | Status: DC
  Administered 2020-01-26: 25 mg via INTRAVENOUS
  Filled 2020-01-26: qty 1

## 2020-01-26 MED ORDER — DIPHENHYDRAMINE HCL 50 MG/ML IJ SOLN
INTRAMUSCULAR | Status: AC
  Filled 2020-01-26: qty 1

## 2020-01-26 NOTE — Assessment & Plan Note (Signed)
It seems more likely a stress fracture of the medial malleolus with increased hyperemia observed on ultrasound.  Seems less likely for posterior tibialis tendinitis.  Could potentially be tarsal tunnel. -Cam walker. -Counseled on supportive care. -Counseled on nonweightbearing. -Counseled on nutrition. -Follow-up in 4 weeks.

## 2020-01-26 NOTE — ED Provider Notes (Signed)
[facility] EMERGENCY DEPARTMENT Provider Note   CSN: [id number] Arrival date & time: 01/26/20  0402     History Chief Complaint  Patient presents with  . Headache    Michelle Vincent is a 29 y.o. female.  HPI     This is a 29 year old female G1, P0 who presents with headache.  Patient is currently [redacted] weeks pregnant.  She has a history of migraines and is currently experiencing hyperemesis which she has barracks throughout her pregnancy.  She states she went to bed last night and had a mild headache.  She woke up with both an occipital and frontal headache.  She took her medications at home including Fioricet with minimal relief.  Patient states that her pain is consistent with her prior migraine pain.  She additionally endorses photosensitivity and nausea.  Patient reports that she has nauseous almost every day and this is unchanged.  She has not had any vomiting.  She denies any weakness, numbness, speech difficulty, strokelike symptoms.  She currently rates her pain at 10 out of 10.  Past Medical History:  Diagnosis Date  . Anemia   . Anxiety    takes lexapro  . Asthma    prn inhaler use  . HA (headache)   . Migraine   . Occipital neuralgia   . Sinus tachycardia   . SVT (supraventricular tachycardia) [facility])     Patient Active Problem List   Diagnosis Date Noted  . Gallbladder polyp 12/25/2019  . Pelvic pain affecting pregnancy in third trimester, antepartum 12/15/2019  . Hyperemesis gravidarum 08/29/2019  . Supervision of normal first pregnancy, antepartum 08/12/2019  . Allergic rhinitis 08/12/2019  . Tachycardia 03/03/2019  . 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 Mild intermittent asthma without complication 58/85/0277  . Chronic migraine without aura without status migrainosus, not intractable 05/24/2017  . 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 shoulder Right   . TONSILLECTOMY AND  ADENOIDECTOMY    . tubes in ears       OB History    Gravida  1   Para  0   Term  0   Preterm  0   AB  0   Living  0     SAB  0   TAB  0   Ectopic  0   Multiple  0   Live Births  0           Family History  Problem Relation Age of Onset  . Hypertension Mother   . Transient ischemic attack Mother   . Diabetes Brother        Type 1  . Breast cancer Maternal Aunt   . Cancer Maternal Aunt     Social History   Tobacco Use  . Smoking status: Never Smoker  . Smokeless tobacco: Never Used  Substance Use Topics  . Alcohol use: Not Currently  . Drug use: Never    Home Medications Prior to Admission medications   Medication Sig Start Date End Date Taking? Authorizing Provider  Butalbital-APAP-Caffeine 50-325-40 MG capsule Take 1-2 capsules by mouth every 6 (six) hours as needed for headache. 10/10/19  Yes Teague Bobbye Morton, PA-C  cyclobenzaprine (FLEXERIL) 10 MG tablet Take 1 tablet (10 mg total) by mouth 3 (three) times daily as needed for muscle spasms. 10/10/19  Yes Paticia Stack, PA-C  Elastic Bandages & Supports (COMFORT FIT MATERNITY SUPP SM) MISC 1 Units by  Does not apply route daily as needed. 11/08/19   Nugent, Odie Sera, NP  Elastic Bandages & Supports (WRIST SPLINT/COCK-UP/RIGHT M) MISC 1 Units by Does not apply route at bedtime. 01/22/20   Levie Heritage, DO  escitalopram (LEXAPRO) 10 MG tablet TAKE 1 TABLET BY MOUTH EVERY DAY 01/21/20   Levie Heritage, DO  famotidine (PEPCID) 20 MG tablet TAKE 1 TABLET BY MOUTH TWICE A DAY 01/21/20   Levie Heritage, DO  ferrous sulfate 325 (65 FE) MG tablet Take 325 mg by mouth 3 (three) times daily. 12/04/14   [provider]  ondansetron (ZOFRAN ODT) 4 MG disintegrating tablet 4mg  ODT q4 hours prn nausea/vomit 01/15/20   03/16/20, PA-C  ondansetron (ZOFRAN) 8 MG tablet Take 1 tablet (8 mg total) by mouth every 8 (eight) hours as needed for nausea or vomiting. 11/19/19   11/21/19,  MD  pantoprazole (PROTONIX) 40 MG tablet Take 1 tablet (40 mg total) by mouth daily. 12/26/19   12/28/19, DO  Prenatal Vit-Fe Fumarate-FA (PRENATAL MULTIVITAMIN) TABS tablet Take 1 tablet by mouth daily at 12 noon.    [provider]  promethazine (PHENERGAN) 25 MG suppository Place 1 suppository (25 mg total) rectally every 6 (six) hours as needed for nausea or vomiting. 09/23/19   09/25/19, DO  propranolol (INDERAL) 10 MG tablet Take 2 tablets (20 mg total) by mouth 2 (two) times daily. 01/08/20   03/09/20, DO  zolpidem (AMBIEN) 5 MG tablet Take 1 tablet (5 mg total) by mouth at bedtime as needed for sleep. 11/19/19   11/21/19, MD    Allergies    Nitrous oxide, Peach flavor, Shellfish allergy, Adhesive [tape], and Morphine and related  Review of Systems   Review of Systems  Eyes: Positive for photophobia and visual disturbance.  Respiratory: Negative for shortness of breath.   Cardiovascular: Negative for chest pain.  Gastrointestinal: Positive for nausea. Negative for vomiting.  Genitourinary: Negative for dysuria.  Neurological: Positive for headaches.  All other systems reviewed and are negative.   Physical Exam Updated Vital Signs BP 121/60 (BP Location: Right Arm)   Pulse 89   Temp 97.7 F (36.5 C) (Oral)   Resp 18   Ht 1.702 m (5\' 7" )   Wt 89.4 kg   LMP 06/01/2019   SpO2 98%   BMI 30.85 kg/m   Physical Exam Vitals and nursing note reviewed.  Constitutional:      Appearance: She is well-developed. She is not ill-appearing.  HENT:     Head: Normocephalic and atraumatic.  Eyes:     Extraocular Movements: Extraocular movements intact.     Pupils: Pupils are equal, round, and reactive to light.  Cardiovascular:     Rate and Rhythm: Normal rate and regular rhythm.     Heart sounds: Normal heart sounds.  Pulmonary:     Effort: Pulmonary effort is normal. No respiratory distress.  Abdominal:     Palpations: Abdomen is  soft.     Comments: Gravid, nontender  Musculoskeletal:        General: No swelling.     Cervical back: Normal range of motion and neck supple.  Skin:    General: Skin is warm and dry.  Neurological:     Mental Status: She is alert and oriented to person, place, and time.     Comments: Cranial nerves II through XII intact, 5 out of 5 strength in all 4 extremities, no dysmetria  to finger-nose finger  Psychiatric:        Mood and Affect: Mood normal.     ED Results / Procedures / Treatments   Labs (all labs ordered are listed, but only abnormal results are displayed) Labs Reviewed - No data to display  EKG None  Radiology No results found.  Procedures Procedures (including critical care time)  Medications Ordered in ED Medications  prochlorperazine (COMPAZINE) injection 10 mg (10 mg Intravenous Given 01/26/20 0445)  diphenhydrAMINE (BENADRYL) injection 25 mg (25 mg Intravenous Given 01/26/20 0445)  lactated ringers bolus 1,000 mL (0 mLs Intravenous Stopped 01/26/20 4970)    ED Course  I have reviewed the triage vital signs and the nursing notes.  Pertinent labs & imaging results that were available during my care of the patient were reviewed by me and considered in my medical decision making (see chart for details).  Clinical Course as of Jan 26 707  Mon Jan 26, 2020  2637 Patient with some improvement following coctail.  Rates pain at 8/10.  Declines further treatment at this time.  Will allow to rest.   [CH]  0707 On recheck, patient reports persistent improvement.  Now 3 out of 10.  She would like to go home and rest.  Fetal heart rate 133 per Doppler.  She has no pregnancy complaints at this time.  Will have her follow-up with her OB as scheduled.   [CH]    Clinical Course User Index [CH] Gregary Blackard, Mayer Masker, MD   MDM Rules/Calculators/[name]                       Patient presents with headache.  History of migraines with similar symptoms in the past.  She is  overall nontoxic and vital signs initially notable for a slightly elevated blood pressure at 130/77.  No history of hypertension during this pregnancy.  With repositioning repeat blood pressure 121/60.  This is reassuring.  Low suspicion for preeclampsia.  Patient was given a migraine cocktail including fluids, Compazine, and Benadryl.  She was allowed to rest.  She has no other red flags without any neurologic deficits.  On multiple rechecks she is improving and feels comfortable to go home.  Will defer further OB care and ongoing care for hyperemesis to her OB/GYN.  After history, exam, and medical workup I feel the patient has been appropriately medically screened and is safe for discharge home. Pertinent diagnoses were discussed with the patient. Patient was given return precautions.   Final Clinical Impression(s) / ED Diagnoses Final diagnoses:  Migraine without aura and without status migrainosus, not intractable    Rx / DC Orders ED Discharge Orders    None       Shaleen Talamantez, Mayer Masker, MD 01/26/20 0710

## 2020-01-26 NOTE — Patient Instructions (Signed)
Nice to meet you Please try ice  Please try the boot  Please send me a message in MyChart with any questions or updates.  Please see me back in 4 weeks.   --Dr. Jordan Likes

## 2020-01-26 NOTE — ED Notes (Signed)
FHT 133 via bedside doppler.

## 2020-01-26 NOTE — ED Triage Notes (Signed)
Pt arrives with complaints of headache similar to previous hx of migraines -- states ongoing since 2200 last night. Tried tylenol, flexeril and Fioricet without relief. States ice packs not effective. 10/10 sharp stabbing pain. Also endorsing nausea, no vomiting.   Pt [redacted] weeks pregnant, G1P0.

## 2020-01-26 NOTE — Progress Notes (Signed)
Michelle Vincent - 29 y.o. female MRN [id number]  Date of birth: 19-Mar-1991  SUBJECTIVE:  Including CC & ROS.  Chief Complaint  Patient presents with  . Foot Pain    left foot x 2 weeks    Michelle Vincent is a 29 y.o. female that is presenting with left ankle pain.  The pain is been ongoing for a few weeks.  She denies any specific injury or inciting event.  The pain is over the medial malleolus.  She was walking while she is working at [employer].  She feels pain with weightbearing and more at the end of the day.  It can be severe at times.  Seems to be localized to this area.   Review of Systems See HPI   HISTORY: Past Medical, Surgical, Social, and Family History Reviewed & Updated per EMR.   Pertinent Historical Findings include:  Past Medical History:  Diagnosis Date  . Anemia   . Anxiety    takes lexapro  . Asthma    prn inhaler use  . HA (headache)   . Migraine   . Occipital neuralgia   . Sinus tachycardia   . SVT (supraventricular tachycardia) (HCC)     Past Surgical History:  Procedure Laterality Date  . shoulder Right   . TONSILLECTOMY AND ADENOIDECTOMY    . tubes in ears      Family History  Problem Relation Age of Onset  . Hypertension Mother   . Transient ischemic attack Mother   . Diabetes Brother        Type 1  . Breast cancer Maternal Aunt   . Cancer Maternal Aunt     Social History   Socioeconomic History  . Marital status: Single    Spouse name: Not on file  . Number of children: Not on file  . Years of education: Not on file  . Highest education level: Not on file  Occupational History  . Not on file  Tobacco Use  . Smoking status: Never Smoker  . Smokeless tobacco: Never Used  Substance and Sexual Activity  . Alcohol use: Not Currently  . Drug use: Never  . Sexual activity: Not on file  Other Topics Concern  . Not on file  Social History Narrative   EMT   Social Determinants of Health   Financial Resource Strain:     . Difficulty of Paying Living Expenses:   Food Insecurity:   . Worried About Programme researcher, broadcasting/film/video in the Last Year:   . Barista in the Last Year:   Transportation Needs:   . Freight forwarder (Medical):   Marland Kitchen Lack of Transportation (Non-Medical):   Physical Activity:   . Days of Exercise per Week:   . Minutes of Exercise per Session:   Stress:   . Feeling of Stress :   Social Connections:   . Frequency of Communication with Friends and Family:   . Frequency of Social Gatherings with Friends and Family:   . Attends Religious Services:   . Active Member of Clubs or Organizations:   . Attends Banker Meetings:   Marland Kitchen Marital Status:   Intimate Partner Violence:   . Fear of Current or Ex-Partner:   . Emotionally Abused:   Marland Kitchen Physically Abused:   . Sexually Abused:      PHYSICAL EXAM:  VS: BP 126/80   Ht 5\' 7"  (1.702 m)   Wt 197 lb (89.4 kg)   LMP 06/01/2019  BMI 30.85 kg/m  Physical Exam Gen: NAD, alert, cooperative with exam, well-appearing MSK:  Left ankle: Tenderness to palpation over the distal tibia and medial malleolus. Normal range of motion. Normal strength resistance. Tenderness at the insertion of the Achilles. No significant tenderness at the navicular bone. Neurovascularly intact  Limited ultrasound: Left ankle:  Posterior tibialis tendon with mild effusion around the anterior portion of the medial malleolus.  Normal insertion into the navicular bone. Normal-appearing plantar fascia. Normal-appearing Achilles tendon. There is increased hyperemia at the medial malleolus to suggest a stress reaction.  There is an anechoic change over this area in the soft tissue.  Summary: Findings would suggest a stress reaction of the medial malleolus.  Ultrasound and interpretation by Clearance Coots, MD    ASSESSMENT & PLAN:   Stress reaction of bone It seems more likely a stress fracture of the medial malleolus with increased hyperemia  observed on ultrasound.  Seems less likely for posterior tibialis tendinitis.  Could potentially be tarsal tunnel. -Cam walker. -Counseled on supportive care. -Counseled on nonweightbearing. -Counseled on nutrition. -Follow-up in 4 weeks.

## 2020-01-26 NOTE — ED Notes (Signed)
Pt states that "fentanyl works really well" for her headaches and asked this RN to notify provider. EDP notified, no change in orders at this time. Pt notified.

## 2020-01-26 NOTE — Discharge Instructions (Addendum)
You were seen today for migraine headache.  Continue to take medications as prescribed per your primary physicians.  Make sure to stay hydrated as this will prevent further episodes.  Regarding your pregnancy, fetal heart tones were reassuring.  If you develop any abdominal pain, loss of fluids, vaginal bleeding or any new or worsening symptoms you should be reevaluated.

## 2020-01-28 ENCOUNTER — Other Ambulatory Visit: Payer: Self-pay

## 2020-01-28 ENCOUNTER — Encounter (HOSPITAL_COMMUNITY)
Admission: RE | Admit: 2020-01-28 | Discharge: 2020-01-28 | Disposition: A | Discharge status: Home / Self Care | Payer: 59 | Source: Ambulatory Visit | Attending: Family Medicine | Admitting: Family Medicine

## 2020-01-28 MED ORDER — DIPHENHYDRAMINE HCL 50 MG/ML IJ SOLN
25.0000 mg | INTRAMUSCULAR | Status: DC
  Administered 2020-01-28: 25 mg via INTRAVENOUS

## 2020-01-28 MED ORDER — DIPHENHYDRAMINE HCL 50 MG/ML IJ SOLN
INTRAMUSCULAR | Status: AC
  Filled 2020-01-28: qty 1

## 2020-01-28 MED ORDER — SODIUM CHLORIDE 0.9 % IV SOLN
25.0000 mg | INTRAVENOUS | Status: DC
  Administered 2020-01-28: 11:00:00 25 mg via INTRAVENOUS
  Filled 2020-01-28: qty 1

## 2020-01-29 ENCOUNTER — Other Ambulatory Visit: Payer: Self-pay

## 2020-01-29 ENCOUNTER — Encounter (HOSPITAL_COMMUNITY)
Admission: RE | Admit: 2020-01-29 | Discharge: 2020-01-29 | Disposition: A | Discharge status: Home / Self Care | Payer: 59 | Source: Ambulatory Visit | Attending: Family Medicine | Admitting: Family Medicine

## 2020-01-29 MED ORDER — SODIUM CHLORIDE 0.9 % IV SOLN
25.0000 mg | INTRAVENOUS | Status: DC
  Administered 2020-01-29: 25 mg via INTRAVENOUS
  Filled 2020-01-29: qty 1

## 2020-01-29 MED ORDER — DIPHENHYDRAMINE HCL 50 MG/ML IJ SOLN: 25.0000 mg | INTRAMUSCULAR | Status: DC

## 2020-01-29 MED ORDER — DIPHENHYDRAMINE HCL 50 MG/ML IJ SOLN
INTRAMUSCULAR | Status: AC
  Administered 2020-01-29: 25 mg via INTRAVENOUS
  Filled 2020-01-29: qty 1

## 2020-01-30 ENCOUNTER — Other Ambulatory Visit: Payer: Self-pay | Admitting: Family Medicine

## 2020-01-30 ENCOUNTER — Encounter: Payer: Self-pay | Admitting: Family Medicine

## 2020-01-30 ENCOUNTER — Encounter (HOSPITAL_COMMUNITY): Payer: 59

## 2020-01-30 DIAGNOSIS — O210 Mild hyperemesis gravidarum: Secondary | ICD-10-CM

## 2020-02-02 ENCOUNTER — Other Ambulatory Visit: Payer: Self-pay

## 2020-02-02 ENCOUNTER — Encounter (HOSPITAL_COMMUNITY)
Admission: RE | Admit: 2020-02-02 | Discharge: 2020-02-02 | Disposition: A | Discharge status: Home / Self Care | Payer: 59 | Source: Ambulatory Visit | Attending: Family Medicine | Admitting: Family Medicine

## 2020-02-02 ENCOUNTER — Ambulatory Visit: Payer: Self-pay

## 2020-02-02 ENCOUNTER — Ambulatory Visit (INDEPENDENT_AMBULATORY_CARE_PROVIDER_SITE_OTHER): Payer: 59 | Admitting: Family Medicine

## 2020-02-02 DIAGNOSIS — G5601 Carpal tunnel syndrome, right upper limb: Secondary | ICD-10-CM

## 2020-02-02 DIAGNOSIS — O210 Mild hyperemesis gravidarum: Secondary | ICD-10-CM

## 2020-02-02 MED ORDER — PROMETHAZINE HCL 25 MG/ML IJ SOLN
25.0000 mg | INTRAVENOUS | Status: DC
  Administered 2020-02-02: 25 mg via INTRAVENOUS
  Filled 2020-02-02: qty 1

## 2020-02-02 MED ORDER — METHYLPREDNISOLONE ACETATE 40 MG/ML IJ SUSP
40.0000 mg | Freq: Once | INTRAMUSCULAR | Status: AC
  Administered 2020-02-02: 40 mg via INTRA_ARTICULAR

## 2020-02-02 MED ORDER — DIPHENHYDRAMINE HCL 50 MG/ML IJ SOLN
INTRAMUSCULAR | Status: AC
  Filled 2020-02-02: qty 1

## 2020-02-02 MED ORDER — DIPHENHYDRAMINE HCL 50 MG/ML IJ SOLN
25.0000 mg | INTRAMUSCULAR | Status: DC
  Administered 2020-02-02: 25 mg via INTRAVENOUS

## 2020-02-02 MED ORDER — SODIUM CHLORIDE 0.9 % IV SOLN
25.0000 mg | INTRAVENOUS | Status: DC
  Filled 2020-02-02: qty 1

## 2020-02-02 NOTE — Addendum Note (Signed)
Addended by: Kathi Simpers F on: 02/02/2020 04:01 PM   Modules accepted: Orders

## 2020-02-02 NOTE — Progress Notes (Signed)
Michelle Vincent - 29 y.o. female MRN [id number]  Date of birth: [birth year]  SUBJECTIVE:  Including CC & ROS.  No chief complaint on file.   Michelle Vincent is a 29 y.o. female that is presenting with right hand ulcer sensation.  She has tried bracing with limited improvement.  Symptoms are appearing on the palmar aspect of the first 3 digits.   Review of Systems See HPI   HISTORY: Past Medical, Surgical, Social, and Family History Reviewed & Updated per EMR.   Pertinent Historical Findings include:  Past Medical History:  Diagnosis Date  . Anemia   . Anxiety    takes lexapro  . Asthma    prn inhaler use  . HA (headache)   . Migraine   . Occipital neuralgia   . Sinus tachycardia   . SVT (supraventricular tachycardia) (HCC)     Past Surgical History:  Procedure Laterality Date  . shoulder Right   . TONSILLECTOMY AND ADENOIDECTOMY    . tubes in ears      Family History  Problem Relation Age of Onset  . Hypertension Mother   . Transient ischemic attack Mother   . Diabetes Brother        Type 1  . Breast cancer Maternal Aunt   . Cancer Maternal Aunt     Social History   Socioeconomic History  . Marital status: Single    Spouse name: Not on file  . Number of children: Not on file  . Years of education: Not on file  . Highest education level: Not on file  Occupational History  . Not on file  Tobacco Use  . Smoking status: Never Smoker  . Smokeless tobacco: Never Used  Substance and Sexual Activity  . Alcohol use: Not Currently  . Drug use: Never  . Sexual activity: Not on file  Other Topics Concern  . Not on file  Social History Narrative   EMT   Social Determinants of Health   Financial Resource Strain:   . Difficulty of Paying Living Expenses:   Food Insecurity:   . Worried About Charity fundraiser in the Last Year:   . Arboriculturist in the Last Year:   Transportation Needs:   . Film/video editor (Medical):   Marland Kitchen Lack of  Transportation (Non-Medical):   Physical Activity:   . Days of Exercise per Week:   . Minutes of Exercise per Session:   Stress:   . Feeling of Stress :   Social Connections:   . Frequency of Communication with Friends and Family:   . Frequency of Social Gatherings with Friends and Family:   . Attends Religious Services:   . Active Member of Clubs or Organizations:   . Attends Archivist Meetings:   Marland Kitchen Marital Status:   Intimate Partner Violence:   . Fear of Current or Ex-Partner:   . Emotionally Abused:   Marland Kitchen Physically Abused:   . Sexually Abused:      PHYSICAL EXAM:  VS: LMP 06/01/2019  Physical Exam Gen: NAD, alert, cooperative with exam, well-appearing  Limited ultrasound: right wrist:  No significant change in the carpal tunnel or median nerve.  Summary: No structural changes observed of the median nerve.  Ultrasound and interpretation by Clearance Coots, MD    Aspiration/Injection Procedure Note Melodie Ashworth 04-Mar-1991  Procedure: Injection Indications: Right carpal tunnel  Procedure Details Consent: Risks of procedure as well as the alternatives and risks of each were explained to the (  patient/caregiver).  Consent for procedure obtained. Time Out: Verified patient identification, verified procedure, site/side was marked, verified correct patient position, special equipment/implants available, medications/allergies/relevent history reviewed, required imaging and test results available.  Performed.  The area was cleaned with iodine and alcohol swabs.    The right carpal tunnel medium nerve was injected using 1 cc's of 40 mg depo-medrol and 2 cc's of 0.25% bupivacaine with a 25 1 1/2" needle.  Ultrasound was used. Images were obtained in long views showing the injection.     A sterile dressing was applied.  Patient did tolerate procedure well.     ASSESSMENT & PLAN:   Carpal tunnel syndrome of right wrist Symptoms would suggest carpal  tunnel of the right hand.  Likely related to her pregnancy. -Injection. -Counseled on home exercise therapy and supportive care. -Can continue bracing at night.

## 2020-02-02 NOTE — Patient Instructions (Signed)
Good to see you Please continue the brace at night  Please try the exercises   Please send me a message in MyChart with any questions or updates.  Please see [place] back as already scheduled.   --Dr. Jordan Likes

## 2020-02-02 NOTE — Assessment & Plan Note (Signed)
Symptoms would suggest carpal tunnel of the right hand.  Likely related to her pregnancy. -Injection. -Counseled on home exercise therapy and supportive care. -Can continue bracing at night.

## 2020-02-04 ENCOUNTER — Other Ambulatory Visit: Payer: Self-pay

## 2020-02-04 ENCOUNTER — Encounter (HOSPITAL_COMMUNITY)
Admission: RE | Admit: 2020-02-04 | Discharge: 2020-02-04 | Disposition: A | Discharge status: Home / Self Care | Payer: 59 | Source: Ambulatory Visit | Attending: Family Medicine | Admitting: Family Medicine

## 2020-02-04 DIAGNOSIS — O210 Mild hyperemesis gravidarum: Secondary | ICD-10-CM

## 2020-02-04 MED ORDER — DIPHENHYDRAMINE HCL 50 MG/ML IJ SOLN
INTRAMUSCULAR | Status: AC
  Filled 2020-02-04: qty 1

## 2020-02-04 MED ORDER — DIPHENHYDRAMINE HCL 50 MG/ML IJ SOLN
25.0000 mg | INTRAMUSCULAR | Status: DC
  Administered 2020-02-04: 25 mg via INTRAVENOUS

## 2020-02-04 MED ORDER — PROMETHAZINE HCL 25 MG/ML IJ SOLN
25.0000 mg | INTRAVENOUS | Status: DC
  Administered 2020-02-04: 25 mg via INTRAVENOUS
  Filled 2020-02-04: qty 1

## 2020-02-05 ENCOUNTER — Ambulatory Visit (INDEPENDENT_AMBULATORY_CARE_PROVIDER_SITE_OTHER): Payer: 59 | Admitting: Family Medicine

## 2020-02-05 VITALS — BP 121/70 | HR 97 | Wt 198.0 lb

## 2020-02-05 DIAGNOSIS — O210 Mild hyperemesis gravidarum: Secondary | ICD-10-CM

## 2020-02-05 DIAGNOSIS — M9904 Segmental and somatic dysfunction of sacral region: Secondary | ICD-10-CM | POA: Diagnosis not present

## 2020-02-05 DIAGNOSIS — R000 Tachycardia, unspecified: Secondary | ICD-10-CM

## 2020-02-05 DIAGNOSIS — O99891 Other specified diseases and conditions complicating pregnancy: Secondary | ICD-10-CM

## 2020-02-05 DIAGNOSIS — Z3400 Encounter for supervision of normal first pregnancy, unspecified trimester: Secondary | ICD-10-CM

## 2020-02-05 DIAGNOSIS — M9903 Segmental and somatic dysfunction of lumbar region: Secondary | ICD-10-CM | POA: Diagnosis not present

## 2020-02-05 DIAGNOSIS — M9905 Segmental and somatic dysfunction of pelvic region: Secondary | ICD-10-CM

## 2020-02-05 DIAGNOSIS — O26893 Other specified pregnancy related conditions, third trimester: Secondary | ICD-10-CM

## 2020-02-05 DIAGNOSIS — Z3A35 35 weeks gestation of pregnancy: Secondary | ICD-10-CM

## 2020-02-05 DIAGNOSIS — O99513 Diseases of the respiratory system complicating pregnancy, third trimester: Secondary | ICD-10-CM

## 2020-02-05 DIAGNOSIS — J4520 Mild intermittent asthma, uncomplicated: Secondary | ICD-10-CM

## 2020-02-05 DIAGNOSIS — M549 Dorsalgia, unspecified: Secondary | ICD-10-CM

## 2020-02-05 NOTE — Progress Notes (Signed)
   PRENATAL VISIT NOTE  Subjective:  Michelle Vincent is a 29 y.o. G1P0000 at [redacted]w[redacted]d being seen today for ongoing prenatal care.  She is currently monitored for the following issues for this high-risk pregnancy and has Tachycardia; Supervision of normal first pregnancy, antepartum; Inappropriate sinus tachycardia; Allergic rhinitis; Chronic migraine without aura without status migrainosus, not intractable; Mild intermittent asthma without complication; Shift work sleep disorder; Hyperemesis gravidarum; Pelvic pain affecting pregnancy in third trimester, antepartum; Gallbladder polyp; Stress reaction of bone; and Carpal tunnel syndrome of right wrist on their problem list.  Patient reports backache. Wearing a boot for her foot, which is causing increased back pain. Had injection in her wrist for carpal tunnel - still numbness, but less pain. Contractions: Irritability. Vag. Bleeding: None.  Movement: Present. Denies leaking of fluid.   The following portions of the patient's history were reviewed and updated as appropriate: allergies, current medications, past family history, past medical history, past social history, past surgical history and problem list. Problem list updated.  Objective:   Vitals:   02/05/20 1603  BP: 121/70  Pulse: 97  Weight: 198 lb (89.8 kg)    Fetal Status: Fetal Heart Rate (bpm): 140   Movement: Present     General:  Alert, oriented and cooperative. Patient is in no acute distress.  Skin: Skin is warm and dry. No rash noted.   Cardiovascular: Normal heart rate noted  Respiratory: Normal respiratory effort, no problems with respiration noted  Abdomen: Soft, gravid, appropriate for gestational age. Pain/Pressure: Present     Pelvic:  Cervical exam deferred        MSK: Restriction, tenderness, tissue texture changes, and paraspinal spasm in the left lumbar spine  Neuro: Moves all four extremities with no focal neurological deficit  Extremities: Normal range of  motion.  Edema: Trace  Mental Status: Normal mood and affect. Normal behavior. Normal judgment and thought content.   OSE: Head   Cervical   Thoracic   Rib   Lumbar L5 ESRL,   Sacrum L/L   Pelvis Right ant innom.    Assessment and Plan:  Pregnancy: G1P0000 at [redacted]w[redacted]d  1. Supervision of normal first pregnancy, antepartum FHT and FH normal. Possible induction at 39 weeks due to continued hyperemesis  2. Hyperemesis gravidarum Continue infusions three times weekly.  3. Mild intermittent asthma without complication Controlled.  4. Inappropriate sinus tachycardia Stable on propranalol  5. Back pain affecting pregnancy in third trimester 6. Somatic dysfunction of lumbar region 7. Somatic dysfunction of sacral region 8. Somatic dysfunction of pelvis region OMT done after patient permission. HVLA technique utilized. 3 areas treated with improvement of tissue texture and joint mobility. Patient tolerated procedure well.     Preterm labor symptoms and general obstetric precautions including but not limited to vaginal bleeding, contractions, leaking of fluid and fetal movement were reviewed in detail with the patient. Please refer to After Visit Summary for other counseling recommendations.  Return in about 1 week (around 02/12/2020) for OB f/u.  Levie Heritage, DO

## 2020-02-06 ENCOUNTER — Encounter (HOSPITAL_COMMUNITY)
Admission: RE | Admit: 2020-02-06 | Discharge: 2020-02-06 | Disposition: A | Discharge status: Home / Self Care | Payer: 59 | Source: Ambulatory Visit | Attending: Family Medicine | Admitting: Family Medicine

## 2020-02-06 ENCOUNTER — Other Ambulatory Visit: Payer: Self-pay

## 2020-02-06 DIAGNOSIS — R000 Tachycardia, unspecified: Secondary | ICD-10-CM | POA: Insufficient documentation

## 2020-02-06 DIAGNOSIS — O210 Mild hyperemesis gravidarum: Secondary | ICD-10-CM | POA: Diagnosis not present

## 2020-02-06 MED ORDER — PROMETHAZINE HCL 25 MG/ML IJ SOLN
25.0000 mg | INTRAVENOUS | Status: DC
  Administered 2020-02-06: 25 mg via INTRAVENOUS
  Filled 2020-02-06: qty 1

## 2020-02-06 MED ORDER — DIPHENHYDRAMINE HCL 50 MG/ML IJ SOLN
INTRAMUSCULAR | Status: AC
  Administered 2020-02-06: 25 mg via INTRAVENOUS
  Filled 2020-02-06: qty 1

## 2020-02-06 MED ORDER — DIPHENHYDRAMINE HCL 50 MG/ML IJ SOLN: 25.0000 mg | INTRAMUSCULAR | Status: DC

## 2020-02-09 ENCOUNTER — Other Ambulatory Visit: Payer: Self-pay | Admitting: Family Medicine

## 2020-02-09 ENCOUNTER — Encounter (HOSPITAL_COMMUNITY)
Admission: RE | Admit: 2020-02-09 | Discharge: 2020-02-09 | Disposition: A | Discharge status: Home / Self Care | Payer: 59 | Source: Ambulatory Visit | Attending: Family Medicine | Admitting: Family Medicine

## 2020-02-09 ENCOUNTER — Other Ambulatory Visit: Payer: Self-pay

## 2020-02-09 DIAGNOSIS — O210 Mild hyperemesis gravidarum: Secondary | ICD-10-CM | POA: Diagnosis not present

## 2020-02-09 MED ORDER — PROMETHAZINE HCL 25 MG/ML IJ SOLN
25.0000 mg | INTRAVENOUS | Status: DC
  Administered 2020-02-09: 11:00:00 25 mg via INTRAVENOUS
  Filled 2020-02-09: qty 1

## 2020-02-09 MED ORDER — DIPHENHYDRAMINE HCL 50 MG/ML IJ SOLN: 25.0000 mg | INTRAMUSCULAR | Status: DC

## 2020-02-09 MED ORDER — DIPHENHYDRAMINE HCL 50 MG/ML IJ SOLN
INTRAMUSCULAR | Status: AC
  Administered 2020-02-09: 25 mg via INTRAVENOUS
  Filled 2020-02-09: qty 1

## 2020-02-09 MED ORDER — FAMOTIDINE 20 MG PO TABS: 20.0000 mg | ORAL_TABLET | Freq: Two times a day (BID) | ORAL | 3 refills | Status: DC

## 2020-02-10 ENCOUNTER — Telehealth: Payer: Self-pay

## 2020-02-10 ENCOUNTER — Inpatient Hospital Stay (HOSPITAL_COMMUNITY)
Admission: AD | Admit: 2020-02-10 | Discharge: 2020-02-10 | Disposition: A | Discharge status: Home / Self Care | Payer: 59 | Attending: Obstetrics and Gynecology | Admitting: Obstetrics and Gynecology

## 2020-02-10 ENCOUNTER — Other Ambulatory Visit: Payer: Self-pay

## 2020-02-10 ENCOUNTER — Encounter (HOSPITAL_COMMUNITY): Payer: Self-pay | Admitting: Obstetrics and Gynecology

## 2020-02-10 DIAGNOSIS — J029 Acute pharyngitis, unspecified: Secondary | ICD-10-CM | POA: Insufficient documentation

## 2020-02-10 DIAGNOSIS — R042 Hemoptysis: Secondary | ICD-10-CM | POA: Diagnosis not present

## 2020-02-10 DIAGNOSIS — Z3A36 36 weeks gestation of pregnancy: Secondary | ICD-10-CM | POA: Insufficient documentation

## 2020-02-10 DIAGNOSIS — O212 Late vomiting of pregnancy: Secondary | ICD-10-CM | POA: Insufficient documentation

## 2020-02-10 DIAGNOSIS — Z79899 Other long term (current) drug therapy: Secondary | ICD-10-CM | POA: Insufficient documentation

## 2020-02-10 DIAGNOSIS — O99513 Diseases of the respiratory system complicating pregnancy, third trimester: Secondary | ICD-10-CM | POA: Insufficient documentation

## 2020-02-10 DIAGNOSIS — O99343 Other mental disorders complicating pregnancy, third trimester: Secondary | ICD-10-CM | POA: Diagnosis not present

## 2020-02-10 DIAGNOSIS — J45909 Unspecified asthma, uncomplicated: Secondary | ICD-10-CM | POA: Diagnosis not present

## 2020-02-10 DIAGNOSIS — O26893 Other specified pregnancy related conditions, third trimester: Secondary | ICD-10-CM | POA: Diagnosis not present

## 2020-02-10 DIAGNOSIS — F419 Anxiety disorder, unspecified: Secondary | ICD-10-CM | POA: Diagnosis not present

## 2020-02-10 DIAGNOSIS — O210 Mild hyperemesis gravidarum: Secondary | ICD-10-CM

## 2020-02-10 LAB — URINALYSIS, ROUTINE W REFLEX MICROSCOPIC
Bilirubin Urine: NEGATIVE
Glucose, UA: NEGATIVE mg/dL
Hgb urine dipstick: NEGATIVE
Ketones, ur: 5 mg/dL — AB
Leukocytes,Ua: NEGATIVE
Nitrite: NEGATIVE
Protein, ur: NEGATIVE mg/dL
Specific Gravity, Urine: 1.006 (ref 1.005–1.030)
pH: 7 (ref 5.0–8.0)

## 2020-02-10 MED ORDER — FAMOTIDINE IN NACL 20-0.9 MG/50ML-% IV SOLN
20.0000 mg | Freq: Once | INTRAVENOUS | Status: AC
  Administered 2020-02-10: 17:00:00 20 mg via INTRAVENOUS
  Filled 2020-02-10: qty 50

## 2020-02-10 MED ORDER — SODIUM CHLORIDE 0.9 % IV SOLN
25.0000 mg | Freq: Once | INTRAVENOUS | Status: AC
  Administered 2020-02-10: 16:00:00 25 mg via INTRAVENOUS
  Filled 2020-02-10: qty 1

## 2020-02-10 NOTE — MAU Provider Note (Signed)
History     CSN: [id number]  Arrival date and time: 02/10/20 1222   First Provider Initiated Contact with Patient 02/10/20 1401      Chief Complaint  Patient presents with  . Hemoptysis  . Sore Throat   [name] is a 29 y.o. year old G33P0000 female at [redacted]w[redacted]d weeks gestation who presents to MAU reporting vomiting started again after about 2 weeks of no vomiting. She goes to the infusion clinic for HG. She reports som blood in her emesis over the weekend and again this morning. She also complains of a sore throat.    OB History    Gravida  1   Para  0   Term  0   Preterm  0   AB  0   Living  0     SAB  0   TAB  0   Ectopic  0   Multiple  0   Live Births  0           Past Medical History:  Diagnosis Date  . Anemia   . Anxiety    takes lexapro  . Asthma    prn inhaler use  . HA (headache)   . Migraine   . Occipital neuralgia   . Sinus tachycardia   . SVT (supraventricular tachycardia) (HCC)     Past Surgical History:  Procedure Laterality Date  . shoulder Right   . TONSILLECTOMY AND ADENOIDECTOMY    . tubes in ears      Family History  Problem Relation Age of Onset  . Hypertension Mother   . Transient ischemic attack Mother   . Diabetes Brother        Type 1  . Breast cancer Maternal Aunt   . Cancer Maternal Aunt     Social History   Tobacco Use  . Smoking status: Never Smoker  . Smokeless tobacco: Never Used  Substance Use Topics  . Alcohol use: Not Currently  . Drug use: Never    Allergies:  Allergies  Allergen Reactions  . Nitrous Oxide Anaphylaxis  . Peach Flavor Anaphylaxis  . Shellfish Allergy Anaphylaxis  . Adhesive [Tape] Hives, Itching and Other (See Comments)    blisters  . Morphine And Related Hives and Itching    Facility-Administered Medications Prior to Admission  Medication Dose Route Frequency Provider Last Rate Last Admin  . diphenhydrAMINE (BENADRYL) injection 25 mg  25 mg Intravenous Q6H  PRN Adam Phenix, MD       Medications Prior to Admission  Medication Sig Dispense Refill Last Dose  . Elastic Bandages & Supports (COMFORT FIT MATERNITY SUPP SM) MISC 1 Units by Does not apply route daily as needed. 1 each 0 02/09/2020 at Unknown time  . Elastic Bandages & Supports (WRIST SPLINT/COCK-UP/RIGHT M) MISC 1 Units by Does not apply route at bedtime. 1 each 0 02/09/2020 at Unknown time  . escitalopram (LEXAPRO) 10 MG tablet TAKE 1 TABLET BY MOUTH EVERY DAY 30 tablet 3 02/09/2020 at Unknown time  . famotidine (PEPCID) 20 MG tablet Take 1 tablet (20 mg total) by mouth 2 (two) times daily. 60 tablet 3 02/09/2020 at Unknown time  . ferrous sulfate 325 (65 FE) MG tablet Take 325 mg by mouth 3 (three) times daily.   02/09/2020 at Unknown time  . ondansetron (ZOFRAN) 8 MG tablet Take 1 tablet (8 mg total) by mouth every 8 (eight) hours as needed for nausea or vomiting. 20 tablet 2 02/09/2020 at  Unknown time  . pantoprazole (PROTONIX) 40 MG tablet Take 1 tablet (40 mg total) by mouth daily. 30 tablet 3 02/09/2020 at Unknown time  . Prenatal Vit-Fe Fumarate-FA (PRENATAL MULTIVITAMIN) TABS tablet Take 1 tablet by mouth daily at 12 noon.   02/09/2020 at Unknown time  . promethazine (PHENERGAN) 25 MG suppository Place 1 suppository (25 mg total) rectally every 6 (six) hours as needed for nausea or vomiting. 60 each 5 02/09/2020 at Unknown time  . propranolol (INDERAL) 10 MG tablet Take 2 tablets (20 mg total) by mouth 2 (two) times daily. 120 tablet 3 02/09/2020 at Unknown time  . zolpidem (AMBIEN) 5 MG tablet Take 1 tablet (5 mg total) by mouth at bedtime as needed for sleep. 30 tablet 1 Past Week at Unknown time  . Butalbital-APAP-Caffeine 50-325-40 MG capsule Take 1-2 capsules by mouth every 6 (six) hours as needed for headache. 30 capsule 3 More than a month at Unknown time  . cyclobenzaprine (FLEXERIL) 10 MG tablet Take 1 tablet (10 mg total) by mouth 3 (three) times daily as needed for muscle spasms. 40 tablet 3  More than a month at Unknown time  . ondansetron (ZOFRAN ODT) 4 MG disintegrating tablet 4mg  ODT q4 hours prn nausea/vomit 10 tablet 0     Review of Systems  Constitutional: Negative.   HENT: Negative.   Eyes: Negative.   Respiratory: Negative.   Cardiovascular: Negative.   Gastrointestinal: Positive for nausea and vomiting.  Endocrine: Negative.   Genitourinary: Negative.   Musculoskeletal: Positive for gait problem (wearing a stabilizer on her LT ankle; "the baby has taken all of my calcium causing stress on my LT joint").  Skin: Negative.   Allergic/Immunologic: Negative.   Hematological: Negative.   Psychiatric/Behavioral: Negative.    Physical Exam   Blood pressure 135/84, pulse 90, temperature 97.6 F (36.4 C), temperature source Oral, resp. rate 18, weight 89.8 kg, last menstrual period 06/01/2019, SpO2 100 %.  Physical Exam  Nursing note and vitals reviewed. Constitutional: She is oriented to person, place, and time. She appears well-developed and well-nourished.  HENT:  Head: Normocephalic.  Eyes: Pupils are equal, round, and reactive to light.  Cardiovascular: Normal rate.  Respiratory: Effort normal.  GI: Soft.  Genitourinary:    Genitourinary Comments: Not indicated   Musculoskeletal:        General: Normal range of motion.     Cervical back: Normal range of motion.  Neurological: She is alert and oriented to person, place, and time.  Skin: Skin is warm and dry.  Psychiatric: She has a normal mood and affect. Her behavior is normal. Judgment and thought content normal.    MAU Course  Procedures  MDM CCUA IVFs: Phenergan 25 mg in LR 1000 ml @ 999 ml/hr; Pepcid 20 mg IVPB -- no nausea/vomiting PO Challenge -- patient tolerated well   Results for orders placed or performed during the hospital encounter of 02/10/20 (from the past 24 hour(s))  Urinalysis, Routine w reflex microscopic     Status: Abnormal   Collection Time: 02/10/20 12:45 PM  Result Value  Ref Range   Color, Urine STRAW (A) YELLOW   APPearance CLEAR CLEAR   Specific Gravity, Urine 1.006 1.005 - 1.030   pH 7.0 5.0 - 8.0   Glucose, UA NEGATIVE NEGATIVE mg/dL   Hgb urine dipstick NEGATIVE NEGATIVE   Bilirubin Urine NEGATIVE NEGATIVE   Ketones, ur 5 (A) NEGATIVE mg/dL   Protein, ur NEGATIVE NEGATIVE mg/dL   Nitrite NEGATIVE NEGATIVE  Leukocytes,Ua NEGATIVE NEGATIVE    Assessment and Plan  Hyperemesis gravidarum - Continue current HG regimen - Discharge patient - Patient verbalized an understanding of the plan of care and agrees.  Raelyn Mora, MSN, CNM 02/10/2020, 2:01 PM

## 2020-02-10 NOTE — MAU Note (Signed)
Had 2 good wks, then started vomiting again.  Is going to the infusion center.  Had some blood in emesis over weekend.  This morning started vomiting blood again. Throat is sore.

## 2020-02-10 NOTE — Telephone Encounter (Signed)
Pt called stating she was throwing up blood and she was prescribed Protonix. Pt states she was able to keep the Protonix down last night but today she is throwing up blood and is not able to keep anything down. Advised pt to go to [facility] because she is not able to keep any fluids down. Understanding was voiced. Madoc Holquin l Latania Bascomb, CMA

## 2020-02-11 ENCOUNTER — Encounter (HOSPITAL_COMMUNITY)
Admission: RE | Admit: 2020-02-11 | Discharge: 2020-02-11 | Disposition: A | Discharge status: Home / Self Care | Payer: 59 | Source: Ambulatory Visit | Attending: Family Medicine | Admitting: Family Medicine

## 2020-02-11 DIAGNOSIS — O210 Mild hyperemesis gravidarum: Secondary | ICD-10-CM | POA: Diagnosis not present

## 2020-02-11 MED ORDER — DIPHENHYDRAMINE HCL 50 MG/ML IJ SOLN: 25.0000 mg | INTRAMUSCULAR | Status: DC

## 2020-02-11 MED ORDER — PROMETHAZINE HCL 25 MG/ML IJ SOLN
25.0000 mg | INTRAVENOUS | Status: DC
  Administered 2020-02-11: 25 mg via INTRAVENOUS
  Filled 2020-02-11: qty 1

## 2020-02-11 MED ORDER — DIPHENHYDRAMINE HCL 50 MG/ML IJ SOLN
INTRAMUSCULAR | Status: AC
  Administered 2020-02-11: 25 mg
  Filled 2020-02-11: qty 1

## 2020-02-12 ENCOUNTER — Other Ambulatory Visit: Payer: Self-pay

## 2020-02-12 ENCOUNTER — Ambulatory Visit (INDEPENDENT_AMBULATORY_CARE_PROVIDER_SITE_OTHER): Payer: 59 | Admitting: Family Medicine

## 2020-02-12 ENCOUNTER — Other Ambulatory Visit (HOSPITAL_COMMUNITY)
Admission: RE | Admit: 2020-02-12 | Discharge: 2020-02-12 | Disposition: A | Discharge status: Home / Self Care | Payer: 59 | Source: Ambulatory Visit | Attending: Family Medicine | Admitting: Family Medicine

## 2020-02-12 VITALS — BP 120/56 | HR 110 | Wt 203.0 lb

## 2020-02-12 DIAGNOSIS — Z3A36 36 weeks gestation of pregnancy: Secondary | ICD-10-CM

## 2020-02-12 DIAGNOSIS — O26899 Other specified pregnancy related conditions, unspecified trimester: Secondary | ICD-10-CM

## 2020-02-12 DIAGNOSIS — I4711 Inappropriate sinus tachycardia, so stated: Secondary | ICD-10-CM

## 2020-02-12 DIAGNOSIS — R000 Tachycardia, unspecified: Secondary | ICD-10-CM

## 2020-02-12 DIAGNOSIS — G5600 Carpal tunnel syndrome, unspecified upper limb: Secondary | ICD-10-CM

## 2020-02-12 DIAGNOSIS — Z3400 Encounter for supervision of normal first pregnancy, unspecified trimester: Secondary | ICD-10-CM

## 2020-02-12 DIAGNOSIS — O210 Mild hyperemesis gravidarum: Secondary | ICD-10-CM

## 2020-02-12 DIAGNOSIS — D649 Anemia, unspecified: Secondary | ICD-10-CM

## 2020-02-12 DIAGNOSIS — O99013 Anemia complicating pregnancy, third trimester: Secondary | ICD-10-CM

## 2020-02-12 DIAGNOSIS — O26893 Other specified pregnancy related conditions, third trimester: Secondary | ICD-10-CM

## 2020-02-12 NOTE — Progress Notes (Signed)
   PRENATAL VISIT NOTE  Subjective:  Michelle Vincent is a 29 y.o. G1P0000 at [redacted]w[redacted]d being seen today for ongoing prenatal care.  She is currently monitored for the following issues for this high-risk pregnancy and has Tachycardia; Supervision of normal first pregnancy, antepartum; Inappropriate sinus tachycardia; Allergic rhinitis; Chronic migraine without aura without status migrainosus, not intractable; Mild intermittent asthma without complication; Shift work sleep disorder; Hyperemesis gravidarum; Pelvic pain affecting pregnancy in third trimester, antepartum; Gallbladder polyp; Stress reaction of bone; and Carpal tunnel syndrome of right wrist on their problem list.  Patient reports still having vomiting, sometimes with streaks of blood. fingers numb from carpal tunnel in pregnancy..  Contractions: Irregular. Vag. Bleeding: None.  Movement: Present. Denies leaking of fluid.   The following portions of the patient's history were reviewed and updated as appropriate: allergies, current medications, past family history, past medical history, past social history, past surgical history and problem list.   Objective:   Vitals:   02/12/20 1327  BP: (!) 120/56  Pulse: (!) 110  Weight: 203 lb (92.1 kg)    Fetal Status: Fetal Heart Rate (bpm): 132   Movement: Present     General:  Alert, oriented and cooperative. Patient is in no acute distress.  Skin: Skin is warm and dry. No rash noted.   Cardiovascular: Normal heart rate noted  Respiratory: Normal respiratory effort, no problems with respiration noted  Abdomen: Soft, gravid, appropriate for gestational age.  Pain/Pressure: Absent     Pelvic: Cervical exam performed in the presence of a chaperone        Extremities: Normal range of motion.  Edema: Trace  Mental Status: Normal mood and affect. Normal behavior. Normal judgment and thought content.   Assessment and Plan:  Pregnancy: G1P0000 at [redacted]w[redacted]d 1. Supervision of normal first  pregnancy, antepartum FHT and FH normal. GBS and GC/CT done today  2. Hyperemesis gravidarum Continue infusion  3. Inappropriate sinus tachycardia On propranalol  4. Carpal tunnel syndrome during pregnancy Sports medicine involved in care  5. Anemia of pregnancy in third trimester Oral iron  Preterm labor symptoms and general obstetric precautions including but not limited to vaginal bleeding, contractions, leaking of fluid and fetal movement were reviewed in detail with the patient. Please refer to After Visit Summary for other counseling recommendations.   No follow-ups on file.  Future Appointments  Date Time Provider Department Center  02/13/2020  8:00 AM [facility] ROOM 9 [facility] None  02/16/2020 10:00 AM [facility] ROOM 9 [facility] None  02/18/2020 10:00 AM [facility] ROOM 11 [facility] None  02/19/2020 11:00 AM Levie Heritage, DO [facility] None  02/20/2020 10:00 AM [facility] ROOM 10 [facility] None  02/23/2020  1:50 PM Myra Rude, MD [facility]  02/26/2020  1:15 PM Levie Heritage, DO [facility] None    Levie Heritage, DO

## 2020-02-13 ENCOUNTER — Encounter (HOSPITAL_COMMUNITY)
Admission: RE | Admit: 2020-02-13 | Discharge: 2020-02-13 | Disposition: A | Discharge status: Home / Self Care | Payer: 59 | Source: Ambulatory Visit | Attending: Family Medicine | Admitting: Family Medicine

## 2020-02-13 DIAGNOSIS — O210 Mild hyperemesis gravidarum: Secondary | ICD-10-CM | POA: Diagnosis not present

## 2020-02-13 LAB — GC/CHLAMYDIA PROBE AMP (~~LOC~~) NOT AT ARMC
Chlamydia: NEGATIVE
Comment: NEGATIVE
Comment: NORMAL
Neisseria Gonorrhea: NEGATIVE

## 2020-02-13 MED ORDER — PROMETHAZINE HCL 25 MG/ML IJ SOLN
25.0000 mg | INTRAVENOUS | Status: DC
  Administered 2020-02-13: 25 mg via INTRAVENOUS
  Filled 2020-02-13: qty 1

## 2020-02-13 MED ORDER — DIPHENHYDRAMINE HCL 50 MG/ML IJ SOLN: 25.0000 mg | INTRAMUSCULAR | Status: DC

## 2020-02-13 MED ORDER — DIPHENHYDRAMINE HCL 50 MG/ML IJ SOLN
INTRAMUSCULAR | Status: AC
  Administered 2020-02-13: 09:00:00 25 mg
  Filled 2020-02-13: qty 1

## 2020-02-16 ENCOUNTER — Encounter (HOSPITAL_COMMUNITY)
Admission: RE | Admit: 2020-02-16 | Discharge: 2020-02-16 | Disposition: A | Discharge status: Home / Self Care | Payer: 59 | Source: Ambulatory Visit | Attending: Family Medicine | Admitting: Family Medicine

## 2020-02-16 ENCOUNTER — Other Ambulatory Visit: Payer: Self-pay

## 2020-02-16 DIAGNOSIS — O210 Mild hyperemesis gravidarum: Secondary | ICD-10-CM | POA: Diagnosis not present

## 2020-02-16 LAB — CULTURE, BETA STREP (GROUP B ONLY): Strep Gp B Culture: NEGATIVE

## 2020-02-16 MED ORDER — DIPHENHYDRAMINE HCL 50 MG/ML IJ SOLN: 25.0000 mg | INTRAMUSCULAR | Status: DC

## 2020-02-16 MED ORDER — PROMETHAZINE HCL 25 MG/ML IJ SOLN
25.0000 mg | INTRAVENOUS | Status: DC
  Administered 2020-02-16: 11:00:00 25 mg via INTRAVENOUS
  Filled 2020-02-16: qty 1

## 2020-02-16 MED ORDER — DIPHENHYDRAMINE HCL 50 MG/ML IJ SOLN
INTRAMUSCULAR | Status: AC
  Administered 2020-02-16: 25 mg via INTRAVENOUS
  Filled 2020-02-16: qty 1

## 2020-02-18 ENCOUNTER — Other Ambulatory Visit: Payer: Self-pay

## 2020-02-18 ENCOUNTER — Encounter (HOSPITAL_COMMUNITY)
Admission: RE | Admit: 2020-02-18 | Discharge: 2020-02-18 | Disposition: A | Discharge status: Home / Self Care | Payer: 59 | Source: Ambulatory Visit | Attending: Family Medicine | Admitting: Family Medicine

## 2020-02-18 MED ORDER — DIPHENHYDRAMINE HCL 50 MG/ML IJ SOLN: 25.0000 mg | INTRAMUSCULAR | Status: DC

## 2020-02-18 MED ORDER — DIPHENHYDRAMINE HCL 50 MG/ML IJ SOLN
INTRAMUSCULAR | Status: AC
  Administered 2020-02-18: 11:00:00 25 mg
  Filled 2020-02-18: qty 1

## 2020-02-18 MED ORDER — PROMETHAZINE HCL 25 MG/ML IJ SOLN
25.0000 mg | INTRAVENOUS | Status: DC
  Administered 2020-02-18: 25 mg via INTRAVENOUS
  Filled 2020-02-18: qty 1

## 2020-02-19 ENCOUNTER — Ambulatory Visit (INDEPENDENT_AMBULATORY_CARE_PROVIDER_SITE_OTHER): Payer: 59 | Admitting: Family Medicine

## 2020-02-19 ENCOUNTER — Other Ambulatory Visit: Payer: Self-pay

## 2020-02-19 VITALS — BP 131/82 | HR 134 | Wt 202.0 lb

## 2020-02-19 DIAGNOSIS — R000 Tachycardia, unspecified: Secondary | ICD-10-CM

## 2020-02-19 DIAGNOSIS — O210 Mild hyperemesis gravidarum: Secondary | ICD-10-CM

## 2020-02-19 DIAGNOSIS — Z3400 Encounter for supervision of normal first pregnancy, unspecified trimester: Secondary | ICD-10-CM

## 2020-02-19 DIAGNOSIS — Z3A37 37 weeks gestation of pregnancy: Secondary | ICD-10-CM

## 2020-02-19 DIAGNOSIS — O99413 Diseases of the circulatory system complicating pregnancy, third trimester: Secondary | ICD-10-CM

## 2020-02-19 NOTE — Progress Notes (Signed)
   PRENATAL VISIT NOTE  Subjective:  Michelle Vincent is a 29 y.o. G1P0000 at [redacted]w[redacted]d being seen today for ongoing prenatal care.  She is currently monitored for the following issues for this low-risk pregnancy and has Tachycardia; Supervision of normal first pregnancy, antepartum; Inappropriate sinus tachycardia; Allergic rhinitis; Chronic migraine without aura without status migrainosus, not intractable; Mild intermittent asthma without complication; Shift work sleep disorder; Hyperemesis gravidarum; Pelvic pain affecting pregnancy in third trimester, antepartum; Gallbladder polyp; Stress reaction of bone; and Carpal tunnel syndrome of right wrist on their problem list.  Patient reports continues to have nausea and vomiting. Having palpitations and tachycardia. .  Contractions: Irregular. Vag. Bleeding: None.  Movement: Present. Denies leaking of fluid.   The following portions of the patient's history were reviewed and updated as appropriate: allergies, current medications, past family history, past medical history, past social history, past surgical history and problem list.   Objective:   Vitals:   02/19/20 1104  BP: 131/82  Pulse: (!) 134  Weight: 202 lb (91.6 kg)    Fetal Status:     Movement: Present     General:  Alert, oriented and cooperative. Patient is in no acute distress.  Skin: Skin is warm and dry. No rash noted.   Cardiovascular: Normal heart rate noted  Respiratory: Normal respiratory effort, no problems with respiration noted  Abdomen: Soft, gravid, appropriate for gestational age.  Pain/Pressure: Absent     Pelvic: Cervical exam deferred        Extremities: Normal range of motion.  Edema: Trace  Mental Status: Normal mood and affect. Normal behavior. Normal judgment and thought content.   Assessment and Plan:  Pregnancy: G1P0000 at [redacted]w[redacted]d  1. Supervision of normal first pregnancy, antepartum FHT and FH normal  2. Hyperemesis gravidarum On 4/12, I discussed  this patient with Dr Parke Poisson (MFM). As patient still hyperemetic with protein deficiency, he recommended induction any time after 37 weeks. Patient amenable for induction. Will schedule for tomorrow.  3. Inappropriate sinus tachycardia On propranolol. Take an extra 20mg  today when she goes home.  Preterm labor symptoms and general obstetric precautions including but not limited to vaginal bleeding, contractions, leaking of fluid and fetal movement were reviewed in detail with the patient. Please refer to After Visit Summary for other counseling recommendations.   No follow-ups on file.  Future Appointments  Date Time Provider Department Center  02/20/2020  6:30 AM [facility] SCHED ROOM [facility] None  02/23/2020  1:50 PM 02/25/2020, MD [facility]  03/26/2020 10:45 AM 03/28/2020, DO [facility] None    Levie Heritage, DO

## 2020-02-20 ENCOUNTER — Inpatient Hospital Stay (HOSPITAL_COMMUNITY)
Admission: AD | Admit: 2020-02-20 | Discharge: 2020-02-26 | DRG: 786 | Disposition: A | Discharge status: Home / Self Care | Payer: 59 | Attending: Family Medicine | Admitting: Family Medicine

## 2020-02-20 ENCOUNTER — Encounter (HOSPITAL_COMMUNITY): Payer: Self-pay | Admitting: Family Medicine

## 2020-02-20 ENCOUNTER — Inpatient Hospital Stay (HOSPITAL_COMMUNITY): Payer: 59

## 2020-02-20 ENCOUNTER — Encounter (HOSPITAL_COMMUNITY): Payer: 59

## 2020-02-20 ENCOUNTER — Other Ambulatory Visit: Payer: Self-pay

## 2020-02-20 DIAGNOSIS — G43709 Chronic migraine without aura, not intractable, without status migrainosus: Secondary | ICD-10-CM | POA: Diagnosis present

## 2020-02-20 DIAGNOSIS — O9942 Diseases of the circulatory system complicating childbirth: Secondary | ICD-10-CM | POA: Diagnosis present

## 2020-02-20 DIAGNOSIS — E669 Obesity, unspecified: Secondary | ICD-10-CM | POA: Diagnosis present

## 2020-02-20 DIAGNOSIS — Z20822 Contact with and (suspected) exposure to covid-19: Secondary | ICD-10-CM | POA: Diagnosis present

## 2020-02-20 DIAGNOSIS — O99354 Diseases of the nervous system complicating childbirth: Secondary | ICD-10-CM | POA: Diagnosis present

## 2020-02-20 DIAGNOSIS — O26893 Other specified pregnancy related conditions, third trimester: Secondary | ICD-10-CM | POA: Diagnosis present

## 2020-02-20 DIAGNOSIS — O9081 Anemia of the puerperium: Secondary | ICD-10-CM | POA: Diagnosis not present

## 2020-02-20 DIAGNOSIS — J4520 Mild intermittent asthma, uncomplicated: Secondary | ICD-10-CM | POA: Diagnosis present

## 2020-02-20 DIAGNOSIS — Z98891 History of uterine scar from previous surgery: Principal | ICD-10-CM

## 2020-02-20 DIAGNOSIS — Z3A37 37 weeks gestation of pregnancy: Secondary | ICD-10-CM | POA: Diagnosis not present

## 2020-02-20 DIAGNOSIS — R000 Tachycardia, unspecified: Secondary | ICD-10-CM | POA: Diagnosis present

## 2020-02-20 DIAGNOSIS — O99214 Obesity complicating childbirth: Secondary | ICD-10-CM | POA: Diagnosis present

## 2020-02-20 DIAGNOSIS — I471 Supraventricular tachycardia: Secondary | ICD-10-CM | POA: Diagnosis present

## 2020-02-20 DIAGNOSIS — O211 Hyperemesis gravidarum with metabolic disturbance: Principal | ICD-10-CM | POA: Diagnosis present

## 2020-02-20 DIAGNOSIS — D62 Acute posthemorrhagic anemia: Secondary | ICD-10-CM | POA: Diagnosis not present

## 2020-02-20 DIAGNOSIS — O210 Mild hyperemesis gravidarum: Secondary | ICD-10-CM | POA: Diagnosis present

## 2020-02-20 DIAGNOSIS — Z3400 Encounter for supervision of normal first pregnancy, unspecified trimester: Secondary | ICD-10-CM

## 2020-02-20 DIAGNOSIS — O9952 Diseases of the respiratory system complicating childbirth: Secondary | ICD-10-CM | POA: Diagnosis present

## 2020-02-20 DIAGNOSIS — O99344 Other mental disorders complicating childbirth: Secondary | ICD-10-CM | POA: Diagnosis present

## 2020-02-20 DIAGNOSIS — F419 Anxiety disorder, unspecified: Secondary | ICD-10-CM

## 2020-02-20 LAB — CBC
HCT: 30.5 % — ABNORMAL LOW (ref 36.0–46.0)
Hemoglobin: 9.5 g/dL — ABNORMAL LOW (ref 12.0–15.0)
MCH: 23.2 pg — ABNORMAL LOW (ref 26.0–34.0)
MCHC: 31.1 g/dL (ref 30.0–36.0)
MCV: 74.6 fL — ABNORMAL LOW (ref 80.0–100.0)
Platelets: 404 10*3/uL — ABNORMAL HIGH (ref 150–400)
RBC: 4.09 MIL/uL (ref 3.87–5.11)
RDW: 14.1 % (ref 11.5–15.5)
WBC: 12.1 10*3/uL — ABNORMAL HIGH (ref 4.0–10.5)
nRBC: 0 % (ref 0.0–0.2)

## 2020-02-20 LAB — RPR: RPR Ser Ql: NONREACTIVE

## 2020-02-20 LAB — SARS CORONAVIRUS 2 (TAT 6-24 HRS): SARS Coronavirus 2: NEGATIVE

## 2020-02-20 LAB — TYPE AND SCREEN
ABO/RH(D): A POS
Antibody Screen: NEGATIVE

## 2020-02-20 MED ORDER — BUTORPHANOL TARTRATE 1 MG/ML IJ SOLN
2.0000 mg | Freq: Once | INTRAMUSCULAR | Status: AC
  Administered 2020-02-20: 2 mg via INTRAVENOUS
  Filled 2020-02-20: qty 2

## 2020-02-20 MED ORDER — PROPRANOLOL HCL 20 MG PO TABS
20.0000 mg | ORAL_TABLET | Freq: Two times a day (BID) | ORAL | Status: DC
  Administered 2020-02-20 – 2020-02-24 (×9): 20 mg via ORAL
  Filled 2020-02-20 (×13): qty 1

## 2020-02-20 MED ORDER — ACETAMINOPHEN 325 MG PO TABS: 650.0000 mg | ORAL_TABLET | ORAL | Status: DC | PRN

## 2020-02-20 MED ORDER — ESCITALOPRAM OXALATE 10 MG PO TABS
10.0000 mg | ORAL_TABLET | Freq: Every day | ORAL | Status: DC
  Administered 2020-02-20 – 2020-02-23 (×4): 10 mg via ORAL
  Filled 2020-02-20 (×5): qty 1

## 2020-02-20 MED ORDER — LACTATED RINGERS IV SOLN
500.0000 mL | Freq: Once | INTRAVENOUS | Status: AC
  Administered 2020-02-22: 500 mL via INTRAVENOUS

## 2020-02-20 MED ORDER — PROMETHAZINE HCL 25 MG/ML IJ SOLN
25.0000 mg | INTRAVENOUS | Status: DC
  Administered 2020-02-20: 25 mg via INTRAVENOUS
  Filled 2020-02-20: qty 1

## 2020-02-20 MED ORDER — ONDANSETRON HCL 4 MG PO TABS: 8.0000 mg | ORAL_TABLET | Freq: Three times a day (TID) | ORAL | Status: DC | PRN

## 2020-02-20 MED ORDER — HYDROXYZINE HCL 50 MG/ML IM SOLN
50.0000 mg | Freq: Four times a day (QID) | INTRAMUSCULAR | Status: DC | PRN
  Administered 2020-02-20 – 2020-02-22 (×2): 50 mg via INTRAMUSCULAR
  Filled 2020-02-20 (×3): qty 1

## 2020-02-20 MED ORDER — EPHEDRINE 5 MG/ML INJ
10.0000 mg | INTRAVENOUS | Status: DC | PRN
  Filled 2020-02-20: qty 10

## 2020-02-20 MED ORDER — DIPHENHYDRAMINE HCL 50 MG/ML IJ SOLN
12.5000 mg | INTRAMUSCULAR | Status: DC | PRN
  Administered 2020-02-24: 12.5 mg via INTRAVENOUS
  Filled 2020-02-20: qty 1

## 2020-02-20 MED ORDER — OXYTOCIN 40 UNITS IN NORMAL SALINE INFUSION - SIMPLE MED
2.5000 [IU]/h | INTRAVENOUS | Status: DC
  Administered 2020-02-24: 40 [IU]/h via INTRAVENOUS
  Filled 2020-02-20: qty 1000

## 2020-02-20 MED ORDER — LIDOCAINE HCL (PF) 1 % IJ SOLN: 30.0000 mL | INTRAMUSCULAR | Status: DC | PRN

## 2020-02-20 MED ORDER — OXYTOCIN BOLUS FROM INFUSION: 500.0000 mL | Freq: Once | INTRAVENOUS | Status: DC

## 2020-02-20 MED ORDER — OXYCODONE-ACETAMINOPHEN 5-325 MG PO TABS: 2.0000 | ORAL_TABLET | ORAL | Status: DC | PRN

## 2020-02-20 MED ORDER — PROMETHAZINE HCL 25 MG/ML IJ SOLN
25.0000 mg | Freq: Once | INTRAVENOUS | Status: AC
  Filled 2020-02-20: qty 1

## 2020-02-20 MED ORDER — MISOPROSTOL 25 MCG QUARTER TABLET
25.0000 ug | ORAL_TABLET | ORAL | Status: DC | PRN
  Administered 2020-02-20 (×3): 25 ug via VAGINAL
  Filled 2020-02-20 (×3): qty 1

## 2020-02-20 MED ORDER — FENTANYL CITRATE (PF) 100 MCG/2ML IJ SOLN
100.0000 ug | INTRAMUSCULAR | Status: DC | PRN
  Administered 2020-02-20 – 2020-02-23 (×13): 100 ug via INTRAVENOUS
  Filled 2020-02-20 (×13): qty 2

## 2020-02-20 MED ORDER — OXYCODONE-ACETAMINOPHEN 5-325 MG PO TABS: 1.0000 | ORAL_TABLET | ORAL | Status: DC | PRN

## 2020-02-20 MED ORDER — MISOPROSTOL 50MCG HALF TABLET
50.0000 ug | ORAL_TABLET | ORAL | Status: DC | PRN
  Administered 2020-02-20 – 2020-02-21 (×2): 50 ug via BUCCAL
  Filled 2020-02-20 (×2): qty 1

## 2020-02-20 MED ORDER — PROMETHAZINE HCL 25 MG/ML IJ SOLN
12.5000 mg | Freq: Once | INTRAMUSCULAR | Status: AC
  Administered 2020-02-20: 12.5 mg via INTRAVENOUS
  Filled 2020-02-20: qty 1

## 2020-02-20 MED ORDER — ESCITALOPRAM OXALATE 10 MG PO TABS: 10.0000 mg | ORAL_TABLET | Freq: Every day | ORAL | Status: DC

## 2020-02-20 MED ORDER — TERBUTALINE SULFATE 1 MG/ML IJ SOLN: 0.2500 mg | Freq: Once | INTRAMUSCULAR | Status: DC | PRN

## 2020-02-20 MED ORDER — EPHEDRINE 5 MG/ML INJ: 10.0000 mg | INTRAVENOUS | Status: DC | PRN

## 2020-02-20 MED ORDER — PHENYLEPHRINE 40 MCG/ML (10ML) SYRINGE FOR IV PUSH (FOR BLOOD PRESSURE SUPPORT): 80.0000 ug | PREFILLED_SYRINGE | INTRAVENOUS | Status: DC | PRN

## 2020-02-20 MED ORDER — ZOLPIDEM TARTRATE 5 MG PO TABS: 5.0000 mg | ORAL_TABLET | Freq: Every evening | ORAL | Status: DC | PRN

## 2020-02-20 MED ORDER — FAMOTIDINE 20 MG PO TABS
20.0000 mg | ORAL_TABLET | Freq: Two times a day (BID) | ORAL | Status: DC
  Administered 2020-02-20 – 2020-02-24 (×9): 20 mg via ORAL
  Filled 2020-02-20 (×9): qty 1

## 2020-02-20 MED ORDER — FENTANYL-BUPIVACAINE-NACL 0.5-0.125-0.9 MG/250ML-% EP SOLN
12.0000 mL/h | EPIDURAL | Status: DC | PRN
  Filled 2020-02-20 (×4): qty 250

## 2020-02-20 MED ORDER — DIPHENHYDRAMINE HCL 50 MG/ML IJ SOLN
25.0000 mg | INTRAMUSCULAR | Status: DC
  Administered 2020-02-20: 25 mg via INTRAVENOUS

## 2020-02-20 MED ORDER — ONDANSETRON HCL 4 MG/2ML IJ SOLN
4.0000 mg | Freq: Four times a day (QID) | INTRAMUSCULAR | Status: DC | PRN
  Administered 2020-02-21 – 2020-02-24 (×8): 4 mg via INTRAVENOUS
  Filled 2020-02-20 (×8): qty 2

## 2020-02-20 MED ORDER — SOD CITRATE-CITRIC ACID 500-334 MG/5ML PO SOLN
30.0000 mL | ORAL | Status: DC | PRN
  Administered 2020-02-24: 30 mL via ORAL

## 2020-02-20 MED ORDER — PHENYLEPHRINE 40 MCG/ML (10ML) SYRINGE FOR IV PUSH (FOR BLOOD PRESSURE SUPPORT)
80.0000 ug | PREFILLED_SYRINGE | INTRAVENOUS | Status: DC | PRN
  Filled 2020-02-20: qty 10

## 2020-02-20 MED ORDER — LACTATED RINGERS IV SOLN: INTRAVENOUS | Status: DC

## 2020-02-20 MED ORDER — LACTATED RINGERS IV SOLN
500.0000 mL | INTRAVENOUS | Status: DC | PRN
  Administered 2020-02-22: 500 mL via INTRAVENOUS
  Administered 2020-02-22: 1000 mL via INTRAVENOUS
  Administered 2020-02-22 – 2020-02-23 (×3): 500 mL via INTRAVENOUS

## 2020-02-20 NOTE — Progress Notes (Signed)
Patient ID: Tracye Szuch, female   DOB: 10/07/1991, 29 y.o.   MRN: [id number]  Given 4th dose of cytotec, this time buccal; has Stadol/Phen on board in prep for foley placement with speculum.  BP 119/80, P 96 FHR 125, some 10x10 accels, occ variables Irreg mild ctx Cx closed/50/vtx -3  IUP@37 .5wks Severe hyperemesis IOL process with unfavorable cx  Attempted to place cervical foley with speculum, but vag walls made it too difficult to visualize cx combined with her discomfort; will continue cytotec during the night and then attempt foley later  Arabella Merles CNM 02/20/2020

## 2020-02-20 NOTE — Progress Notes (Signed)
Patient ID: Michelle Vincent, female   DOB: 09/09/1991, 29 y.o.   MRN: [id number]  Feels well; s/p cytotec x 2nd dose at 1305; pt was feeling anxious earlier, requesting medication, and is now s/p IM Vistaril with good results  BP 107/70, P 77 FHR 130s, +accels, no decels, Cat 1 Toco: irritability  IUP@37 .5wks Severe hyperemesis Cx unfavorable  Continue cx ripening with cytotec, then cervical foley when able  Michelle Vincent [facility] 02/20/2020 3:32 PM

## 2020-02-20 NOTE — H&P (Signed)
Michelle Vincent is a 29 y.o. female G1 at 27.5wks by 10wk u/s presenting for IOL due to severe hyperemesis gravidarum requiring 3x/week infusions of fluid and Phenergan/Benadryl as well as Zofran multiple times daily. She reports some nausea now, but denies ctx, leaking, or bleeding. No H/A or visual disturbances. Her preg has been followed by the [facility] office and has been remarkable for:  # severe hyperemesis gravidarum (3x/weekly Phenergan/Benadryl infusions) # isolated EICF with nl amnio # sinus tachycardia requiring bid propranolol # mild asthma (no meds) # anxiety (on Lexapro)  OB History    Gravida  1   Para  0   Term  0   Preterm  0   AB  0   Living  0     SAB  0   TAB  0   Ectopic  0   Multiple  0   Live Births  0          Past Medical History:  Diagnosis Date  . Anemia   . Anxiety    takes lexapro  . Asthma    prn inhaler use  . HA (headache)   . Migraine   . Occipital neuralgia   . Sinus tachycardia   . SVT (supraventricular tachycardia) (HCC)    Past Surgical History:  Procedure Laterality Date  . shoulder Right   . TONSILLECTOMY AND ADENOIDECTOMY    . tubes in ears     Family History: family history includes Breast cancer in her maternal aunt; Cancer in her maternal aunt; Diabetes in her brother; Hypertension in her mother; Transient ischemic attack in her mother. Social History:  reports that she has never smoked. She has never used smokeless tobacco. She reports previous alcohol use. She reports that she does not use drugs.     Maternal Diabetes: No Genetic Screening: Normal- nl amnio Maternal Ultrasounds/Referrals: Isolated EIF (echogenic intracardiac focus) Fetal Ultrasounds or other Referrals:  Referred to Materal Fetal Medicine  Maternal Substance Abuse:  No Significant Maternal Medications:  Meds include: Other: Lexapro, propranolol, Phenergan, Zofran Significant Maternal Lab Results:  Group B Strep negative Other Comments:   None  Review of Systems History Dilation: Closed Effacement (%): Thick Station: -2 Exam by:: J.Cox, RN Blood pressure 133/83, pulse (!) 127, height 5\' 7"  (1.702 m), weight 91.6 kg, last menstrual period 06/01/2019. Exam Physical Exam  Constitutional: She is oriented to person, place, and time. She appears well-developed.  HENT:  Head: Normocephalic.  Cardiovascular:  Tachycardia, reg to rhythm  Respiratory: Effort normal.  GI:  EFM 130s, +accels, no decels Ctx irreg, mild  Musculoskeletal:        General: Normal range of motion.     Cervical back: Normal range of motion.  Neurological: She is alert and oriented to person, place, and time.  Skin: Skin is warm and dry.  Psychiatric: She has a normal mood and affect. Her behavior is normal. Thought content normal.    Prenatal labs: ABO, Rh: --/--/A POS (04/16 0830) Antibody: NEG (04/16 0830) Rubella: 1.83 (10/06 0929) RPR: Non Reactive (03/04 1318)  HBsAg: Negative (10/06 0929)  HIV: Non Reactive (03/04 1318)  GBS: Negative/-- (04/08 1412)   Assessment/Plan: IUP@37 .5wks Severe hyperemesis gravidarum Inappropriate sinus tachycardia Cx unfavorable  -Admit to Labor and Delivery -Continue Phenergan infusion with Benadryl (was receiving q M/W/F with good success) -Continue prn Zofran -Continue daily Lexapro and scheduled bid propranolol -Plan cx ripening with cytotec and cervical foley initially, then Pitocin/AROM prn; pt and spouse understand this  can be a lengthy process -Anticipate vag del   Arabella Merles CNM 02/20/2020, 10:30 AM

## 2020-02-21 MED ORDER — MISOPROSTOL 50MCG HALF TABLET
50.0000 ug | ORAL_TABLET | ORAL | Status: DC
  Administered 2020-02-21: 50 ug via ORAL

## 2020-02-21 MED ORDER — MISOPROSTOL 50MCG HALF TABLET: 50.0000 ug | ORAL_TABLET | ORAL | Status: AC

## 2020-02-21 MED ORDER — BUTORPHANOL TARTRATE 1 MG/ML IJ SOLN
2.0000 mg | Freq: Once | INTRAMUSCULAR | Status: AC
  Administered 2020-02-21: 15:00:00 2 mg via INTRAVENOUS
  Filled 2020-02-21 (×2): qty 2

## 2020-02-21 MED ORDER — PROMETHAZINE HCL 25 MG/ML IJ SOLN
12.5000 mg | Freq: Once | INTRAMUSCULAR | Status: AC
  Administered 2020-02-21: 12.5 mg via INTRAVENOUS
  Filled 2020-02-21: qty 1

## 2020-02-21 MED ORDER — BUTORPHANOL TARTRATE 1 MG/ML IJ SOLN
2.0000 mg | Freq: Once | INTRAMUSCULAR | Status: AC
  Administered 2020-02-21: 2 mg via INTRAVENOUS
  Filled 2020-02-21: qty 2

## 2020-02-21 MED ORDER — MISOPROSTOL 25 MCG QUARTER TABLET
25.0000 ug | ORAL_TABLET | ORAL | Status: DC | PRN
  Administered 2020-02-21 (×2): 25 ug via VAGINAL
  Filled 2020-02-21 (×2): qty 1

## 2020-02-21 MED ORDER — MISOPROSTOL 50MCG HALF TABLET
ORAL_TABLET | ORAL | Status: AC
  Filled 2020-02-21: qty 1

## 2020-02-21 NOTE — Progress Notes (Addendum)
Labor Progress Note Michelle Vincent is a 29 y.o. G1P0000 at [redacted]w[redacted]d presented for IOL for severe hyperemesis gravidarum.   S: Patient resting comfortably and is without concerns this morning.   O:  BP (!) 100/56   Pulse 64   Temp 97.7 F (36.5 C) (Oral)   Resp 18   Ht 5\' 7"  (1.702 m)   Wt 91.6 kg   LMP 06/01/2019   SpO2 100%   BMI 31.64 kg/m  EFM: 130/moderate variability / pos accels no decels   CVE: Dilation: 1 Effacement (%): Thick Cervical Position: Posterior Station: -2 Presentation: Vertex Exam by:: dr [email or web address]   [name]: 29 y.o. G1P0000 [redacted]w[redacted]d IOL for severe hyperemesis.  #Labor: Progressing slowly s/p Cytotec x6.  Discussed Foley bulb with patient who is agreeable.  Will return to insert Foley bulb as patient requests pain control prior to insertion.  Anticipate VD. #Pain: per pt request  #FWB: Cat 1  #GBS negative    Druscilla Petsch, DO 12:12 PM

## 2020-02-21 NOTE — Progress Notes (Signed)
Patient ID: Michelle Vincent, female   DOB: 02/02/1991, 29 y.o.   MRN: [id number]  S/p cytotec x 6 doses now (vag x 4, buccal x 2); had some vomiting after the 2nd buccal dose but I think a good amount was absorbed first  BP 121/63, P 77 FHR 130s, +accels, no decels Ctx irreg, mild Cx deferred (was FT at last cyto placement)  IUP@37 .6wks Severe hyperemesis Cx unfavorable  Will try for cervical foley placement later this morning  Michelle Vincent [facility] 02/21/2020

## 2020-02-21 NOTE — Progress Notes (Signed)
Labor Progress Note Margaret Cockerill is a 29 y.o. G1P0000 at [redacted]w[redacted]d presented for IOL for severe hyperemesis requiring IV anti-emetic infusions outpatient.  S: Patient comfortable however reports pain medication is starting to wear off.   O:  BP (!) 101/55   Pulse 65   Temp 97.7 F (36.5 C) (Oral)   Resp 18   Ht 5\' 7"  (1.702 m)   Wt 91.6 kg   LMP 06/01/2019   SpO2 100%   BMI 31.64 kg/m  EFM: 120/fair variability / pos accels, no decels  TOCO: none   CVE: Dilation: 1 Effacement (%): Thick Cervical Position: Posterior Station: -2 Presentation: Vertex Exam by:: dr [email or web address]   [name]: 29 y.o. G1P0000 [redacted]w[redacted]d admitted for IOL for severe hyperemesis and found to have hypoproteinemia.  #Labor: S/p Cytotec x6.  Returned to insert FB however patient requests another dose of pain medication prior to insertion.  Will re-assess need of Foley bulb at recheck.   #Pain: per patient request   #FWB: Cat 1  #GBS negative #hyperemesis: phenergan prn   Yaman Grauberger, DO 3:14 PM

## 2020-02-21 NOTE — Progress Notes (Addendum)
Labor Progress Note Michelle Vincent is a 29 y.o. G1P0000 at [redacted]w[redacted]d presented for IOL for severe hyperemesis requiring IV anti-emetic infusions outpatient S: Patient is feeling contractions but is discouraged due to lack of progress.  O:  BP 120/65   Pulse 64   Temp 98.5 F (36.9 C) (Oral)   Resp 16   Ht 5\' 7"  (1.702 m)   Wt 91.6 kg   LMP 06/01/2019   SpO2 100%   BMI 31.64 kg/m  EFM: 120 bpm/moderate variability/ positive 15x15 accelerations/ no deceleration/ irregular contractions   CVE: Dilation: Fingertip Effacement (%): 50 Cervical Position: Posterior Station: -3 Presentation: Vertex Exam by:: Dr. [email or web address]   [name]: 29 y.o. G1P0000 [redacted]w[redacted]d admitted for IOL for severe hyperemesis and found to have hypoproteinemia. #Labor: S/p Cytotec x8, with multiple FB attempts. Will reassess in ~4 hrs and consider Cook's catheter insertion at that time.  #Pain: IV pain meds - Fentanyl prn #FWB: Cat 1 #GBS negative chronic/other problems: hyperemesis: zofran/phenergan per pt request  Anticipate vaginal delivery.    [redacted]w[redacted]d, Medical Student 9:32 PM  GME ATTESTATION:  I saw and evaluated the patient. I agree with the findings and the plan of care as documented in the student's note.  [name], DO OB Fellow, Faculty [facility], Center for [facility] 02/21/2020 10:15 PM

## 2020-02-21 NOTE — Progress Notes (Signed)
Labor Progress Note Michelle Vincent is a 29 y.o. G1P0000 at [redacted]w[redacted]d presented for IOL for severe hyperemesis requiring IV anti-emetic infusions outpatient.   S:  Patient anxious as she would like FB placed and labor to progress. Otherwise has no complaints.   O:  BP 119/65   Pulse 84   Temp 98 F (36.7 C) (Oral)   Resp 18   Ht 5\' 7"  (1.702 m)   Wt 91.6 kg   LMP 06/01/2019   SpO2 100%   BMI 31.64 kg/m  EFM: 120 / good variability / pos accels, no decels  TOCO: irregular   CVE: Dilation: Fingertip Effacement (%): 50 Cervical Position: Posterior Station: -3 Presentation: Vertex Exam by:: [email or web address]   [name]: 30 y.o. G1P0000 [redacted]w[redacted]d admitted for IOL for severe hyperemesis and found to have hypoproteinemia.  #Labor: S/p Cytotec x6.  Multiple failed attempts at foley bulb and Cook's catheter insertion (manual and w/ speculum). Will give an additional dose of Cytotec and reassess in 3-4 hours. Plan to give 1-2 additional doses and if not progressing will start low dose pitocin. Patient agreeable with plan.  Anticipate vaginal delivery.  #Pain: per patient request #FWB: Cat 1  #GBS negative #hyperemesis: zofran / phenergan per patient request   [redacted]w[redacted]d, DO 5:19 PM

## 2020-02-22 ENCOUNTER — Encounter (HOSPITAL_COMMUNITY): Payer: Self-pay | Admitting: Family Medicine

## 2020-02-22 ENCOUNTER — Inpatient Hospital Stay (HOSPITAL_COMMUNITY): Payer: 59 | Admitting: Anesthesiology

## 2020-02-22 LAB — CBC
HCT: 30.2 % — ABNORMAL LOW (ref 36.0–46.0)
Hemoglobin: 9.1 g/dL — ABNORMAL LOW (ref 12.0–15.0)
MCH: 23.1 pg — ABNORMAL LOW (ref 26.0–34.0)
MCHC: 30.1 g/dL (ref 30.0–36.0)
MCV: 76.6 fL — ABNORMAL LOW (ref 80.0–100.0)
Platelets: 362 10*3/uL (ref 150–400)
RBC: 3.94 MIL/uL (ref 3.87–5.11)
RDW: 14.2 % (ref 11.5–15.5)
WBC: 16.2 10*3/uL — ABNORMAL HIGH (ref 4.0–10.5)
nRBC: 0 % (ref 0.0–0.2)

## 2020-02-22 LAB — COMPREHENSIVE METABOLIC PANEL
ALT: 11 U/L (ref 0–44)
AST: 14 U/L — ABNORMAL LOW (ref 15–41)
Albumin: 2.5 g/dL — ABNORMAL LOW (ref 3.5–5.0)
Alkaline Phosphatase: 142 U/L — ABNORMAL HIGH (ref 38–126)
Anion gap: 8 (ref 5–15)
BUN: 6 mg/dL (ref 6–20)
CO2: 24 mmol/L (ref 22–32)
Calcium: 8.9 mg/dL (ref 8.9–10.3)
Chloride: 106 mmol/L (ref 98–111)
Creatinine, Ser: 0.65 mg/dL (ref 0.44–1.00)
GFR calc Af Amer: >60 mL/min (ref >60)
GFR calc non Af Amer: >60 mL/min (ref >60)
Glucose, Bld: 91 mg/dL (ref 70–99)
Potassium: 4.5 mmol/L (ref 3.5–5.1)
Sodium: 138 mmol/L (ref 135–145)
Total Bilirubin: 0.5 mg/dL (ref 0.3–1.2)
Total Protein: 5.8 g/dL — ABNORMAL LOW (ref 6.5–8.1)

## 2020-02-22 MED ORDER — METOCLOPRAMIDE HCL 5 MG/ML IJ SOLN
5.0000 mg | Freq: Once | INTRAMUSCULAR | Status: AC
  Administered 2020-02-22: 5 mg via INTRAVENOUS
  Filled 2020-02-22: qty 2

## 2020-02-22 MED ORDER — ZOLPIDEM TARTRATE 5 MG PO TABS
5.0000 mg | ORAL_TABLET | Freq: Once | ORAL | Status: AC
  Administered 2020-02-22: 5 mg via ORAL
  Filled 2020-02-22: qty 1

## 2020-02-22 MED ORDER — SODIUM CHLORIDE (PF) 0.9 % IJ SOLN
INTRAMUSCULAR | Status: DC | PRN
  Administered 2020-02-22: 12 mL/h via EPIDURAL

## 2020-02-22 MED ORDER — BUTORPHANOL TARTRATE 1 MG/ML IJ SOLN
2.0000 mg | Freq: Once | INTRAMUSCULAR | Status: AC
  Administered 2020-02-22: 2 mg via INTRAVENOUS
  Filled 2020-02-22: qty 2

## 2020-02-22 MED ORDER — LACTATED RINGERS IV SOLN: 500.0000 mL | Freq: Once | INTRAVENOUS | Status: DC

## 2020-02-22 MED ORDER — TERBUTALINE SULFATE 1 MG/ML IJ SOLN: 0.2500 mg | Freq: Once | INTRAMUSCULAR | Status: DC | PRN

## 2020-02-22 MED ORDER — SCOPOLAMINE 1 MG/3DAYS TD PT72
1.0000 | MEDICATED_PATCH | TRANSDERMAL | Status: DC
  Administered 2020-02-22: 1.5 mg via TRANSDERMAL
  Filled 2020-02-22: qty 1

## 2020-02-22 MED ORDER — PROMETHAZINE HCL 25 MG/ML IJ SOLN
12.5000 mg | Freq: Once | INTRAMUSCULAR | Status: AC
  Administered 2020-02-22: 12.5 mg via INTRAVENOUS
  Filled 2020-02-22: qty 1

## 2020-02-22 MED ORDER — FENTANYL CITRATE (PF) 100 MCG/2ML IJ SOLN: 100.0000 ug | Freq: Once | INTRAMUSCULAR | Status: DC

## 2020-02-22 MED ORDER — LIDOCAINE HCL (PF) 1 % IJ SOLN
INTRAMUSCULAR | Status: DC | PRN
  Administered 2020-02-22 (×2): 4 mL via EPIDURAL

## 2020-02-22 MED ORDER — METOCLOPRAMIDE HCL 5 MG/ML IJ SOLN
10.0000 mg | Freq: Once | INTRAMUSCULAR | Status: AC
  Administered 2020-02-22: 10 mg via INTRAVENOUS
  Filled 2020-02-22: qty 2

## 2020-02-22 MED ORDER — OXYTOCIN 40 UNITS IN NORMAL SALINE INFUSION - SIMPLE MED
1.0000 m[IU]/min | INTRAVENOUS | Status: DC
  Administered 2020-02-22: 2 m[IU]/min via INTRAVENOUS
  Administered 2020-02-22: 1 m[IU]/min via INTRAVENOUS

## 2020-02-22 MED ORDER — OXYTOCIN 40 UNITS IN NORMAL SALINE INFUSION - SIMPLE MED
1.0000 m[IU]/min | INTRAVENOUS | Status: DC
  Administered 2020-02-22: 6 m[IU]/min via INTRAVENOUS
  Administered 2020-02-22: 8 m[IU]/min via INTRAVENOUS

## 2020-02-22 MED ORDER — PROMETHAZINE HCL 25 MG/ML IJ SOLN
12.5000 mg | Freq: Four times a day (QID) | INTRAMUSCULAR | Status: DC | PRN
  Administered 2020-02-24: 12.5 mg via INTRAVENOUS
  Filled 2020-02-22: qty 1

## 2020-02-22 NOTE — Progress Notes (Addendum)
Labor Progress Note Michelle Vincent is a 29 y.o. G1P0000 at [redacted]w[redacted]d presented for IOL due to hyperemesis gravidarum. S: Pt report no complaints, is in no pain after epidural.   O:  BP (!) 111/58   Pulse 64   Temp 98.8 F (37.1 C) (Oral)   Resp 16   Ht 5\' 7"  (1.702 m)   Wt 91.6 kg   LMP 06/01/2019   SpO2 99%   BMI 31.64 kg/m  EFM: 120 bpm/moderate variability/15x15 acceleration present/ decelerations absent/ contractions every 2 min  CVE: Dilation: Fingertip(CNM performed SVE around foley bulb) Effacement (%): 50 Cervical Position: Posterior Station: -3 Presentation: Vertex Exam by:: Kooistra, CNM   [name]: 29 y.o. G1P0000 [redacted]w[redacted]d IOL due to hyperemesis gravidarum. #Labor: Progressing. Cooks removed at 20:54 by RN. Plan to recheck in 45 min.-1 hr to reassess and replace Cook's balloon at that time if appropriate. Currently on Pitocin.  #Pain: Epidural in place #FWB: Cat I #GBS negative chronic/other problems: Hyperemesis  zofran/phenergan per pt request  [redacted]w[redacted]d, Medical Student 10:00 PM  GME ATTESTATION:  I saw and evaluated the patient. I agree with the findings and the plan of care as documented in the student's note.  [name], DO OB Fellow, Faculty [facility], Center for [facility] 02/22/2020 10:15 PM

## 2020-02-22 NOTE — Progress Notes (Addendum)
   Michelle Vincent is a 29 y.o. G1P0000 at [redacted]w[redacted]d  admitted for severe hyperemesis gravidarum.  Subjective:  Sleeping, FB still in place.   Objective: Vitals:   02/22/20 1025 02/22/20 1054 02/22/20 1211 02/22/20 1303  BP: (!) 98/54 (!) 109/58 (!) 101/59 113/65  Pulse: (!) 56 60 69 71  Resp:    17  Temp:    97.6 F (36.4 C)  TempSrc:    Oral  SpO2:      Weight:      Height:       No intake/output data recorded.  FHT:  FHR: 120 bpm, variability: moderate,  accelerations:  Abscent,  decelerations:  Absent UC:   Uterine irratability SVE:   Dilation: Fingertip Effacement (%): 50 Station: -3 Exam by:: Dr. Salomon Mast   Labs: Lab Results  Component Value Date   WBC 16.2 (H) 02/22/2020   HGB 9.1 (L) 02/22/2020   HCT 30.2 (L) 02/22/2020   MCV 76.6 (L) 02/22/2020   PLT 362 02/22/2020    Assessment / Plan: Patient sleeping, will recheck at 4 pm.  Will have conversation about starting pitocin at 4 pm. Patient s/p bolus after had two episodes of decelerations.  Labor: latent labor Fetal Wellbeing:  Category II Pain Control:  IV pain meds Anticipated MOD:  NSVD  Charlesetta Garibaldi Jerell Demery 02/22/2020, 2:05 PM

## 2020-02-22 NOTE — Progress Notes (Addendum)
Labor Progress Note Michelle Vincent is a 29 y.o. G1P0000 at [redacted]w[redacted]d presented for IOL due to hyperemesis gravidarum. S: Pt reports has no complaints right now, is resting on her left side.   O:  BP 107/61   Pulse 63   Temp 98.8 F (37.1 C) (Oral)   Resp 16   Ht 5\' 7"  (1.702 m)   Wt 91.6 kg   LMP 06/01/2019   SpO2 99%   BMI 31.64 kg/m  EFM: 120 bpm/moderate variability/15x15 accelerations present/one deceleration variable while pt was on her back and resolved with repositioning/ contractions irregular  CVE: Dilation: 2 Effacement (%): 50 Cervical Position: Posterior Station: -2 Presentation: Vertex Exam by:: Dr. [email or web address]   [name]: 29 y.o. G1P0000 [redacted]w[redacted]d IOL due to hyperemesis gravidarum. #Labor: S/p Cytotec x8. Now on Pitocin, pit at 12. S/p Cook's catheter x ~19 hours, removed at 2054 hours. Foley balloon placed at this check without difficulty.  #Pain: Epidural  #FWB: Cat II for prolonged variable decel- reassuring based on prompt return to baseline with position change, moderate variability, and presence of accelerations #GBS negative chronic/other problems: Hyperemesis  zofran/phenergan per pt request  2055, MS3 11:25 PM  GME ATTESTATION:  I saw and evaluated the patient. I agree with the findings and the plan of care as documented in the student's note.  [name], DO OB Fellow, Faculty [facility], Center for [facility] Healthcare 02/22/2020 11:47 PM

## 2020-02-22 NOTE — Anesthesia Preprocedure Evaluation (Signed)
Anesthesia Evaluation  Patient identified by MRN, date of birth, ID band Patient awake    Reviewed: Allergy & Precautions, Patient's Chart, lab work & pertinent test results, reviewed documented beta blocker date and time   Airway Mallampati: II  TM Distance: >3 FB Neck ROM: Full    Dental no notable dental hx. (+) Teeth Intact   Pulmonary asthma ,    Pulmonary exam normal breath sounds clear to auscultation       Cardiovascular Normal cardiovascular exam+ dysrhythmias Supra Ventricular Tachycardia  Rhythm:Regular Rate:Normal     Neuro/Psych  Headaches, Anxiety  Neuromuscular disease    GI/Hepatic Neg liver ROS, GERD  Controlled and Medicated,  Endo/Other  Obesity  Renal/GU negative Renal ROS  negative genitourinary   Musculoskeletal   Abdominal (+) + obese,   Peds  Hematology  (+) anemia ,   Anesthesia Other Findings   Reproductive/Obstetrics (+) Pregnancy                             Anesthesia Physical Anesthesia Plan  ASA: II  Anesthesia Plan: Epidural   Post-op Pain Management:    Induction:   PONV Risk Score and Plan:   Airway Management Planned: Natural Airway  Additional Equipment:   Intra-op Plan:   Post-operative Plan:   Informed Consent: I have reviewed the patients History and Physical, chart, labs and discussed the procedure including the risks, benefits and alternatives for the proposed anesthesia with the patient or authorized representative who has indicated his/her understanding and acceptance.       Plan Discussed with: Anesthesiologist  Anesthesia Plan Comments:         Anesthesia Quick Evaluation

## 2020-02-22 NOTE — Progress Notes (Addendum)
Labor Progress Note Michelle Vincent is a 29 y.o. G1P0000 at [redacted]w[redacted]d presented for IOL for severe hyperemesis requiring IV anti-emetic infusions outpatient  S: Pt continuing to feel contractions and is tired. Tried to eat saltines, but vomited.  O:  BP 117/72   Pulse 66   Temp 98 F (36.7 C) (Oral)   Resp 16   Ht 5\' 7"  (1.702 m)   Wt 91.6 kg   LMP 06/01/2019   SpO2 100%   BMI 31.64 kg/m  EFM: 120 bpm/moderate variability/15x15 accelerations present/ no decelerations/ contractions irregular  CVE: Dilation: Fingertip Effacement (%): 50 Cervical Position: Posterior Station: -3 Presentation: Vertex Exam by:: Dr. [email or web address]   [name]: 29 y.o. G1P0000 [redacted]w[redacted]d admitted for IOL for severe hyperemesis and found to have hypopoteinemia  #Labor: Progressing. S/p Cytotec x8. Cook's catheter placed by Dr. [redacted]w[redacted]d, vaginal cytotec seen in vaginal vault. Will defer pelvic exams until Cooks comes out. Will hold off further medication until morning team arrives.  #Pain: IV pain meds #FWB: Cat 1 #GBS negative chronic/other problems: hyperemesis: zofran/phenergan per pt request  Anticipate Vaginal Delivery   Salomon Mast, MS3 1:48 AM  GME ATTESTATION:  I saw and evaluated the patient. I agree with the findings and the plan of care as documented in the student's note.   [name], DO OB Fellow, Faculty [facility], Center for [facility] 02/22/2020 2:11 AM

## 2020-02-22 NOTE — Progress Notes (Addendum)
Labor Progress Note Michelle Vincent is a 29 y.o. G1P0000 at [redacted]w[redacted]d presented for IOL for severe hyperemesis requiring IV anti-emetic infusions outpatient S: Pt asleep so did not wake her up. Per nurse, pt is still experiencing pain and received another dose of Fentanyl.  O:  BP 127/79   Pulse (!) 55   Temp 98 F (36.7 C) (Oral)   Resp 18   Ht 5\' 7"  (1.702 m)   Wt 91.6 kg   LMP 06/01/2019   SpO2 100%   BMI 31.64 kg/m  EFM: 120bpm/moderate variability/no accelerations/ no decelerations/ contractions 3-4 min.  CVE: Dilation: Fingertip Effacement (%): 50 Cervical Position: Posterior Station: -3 Presentation: Vertex Exam by:: Dr. [email or web address]   [name]: 29 y.o. G1P0000 [redacted]w[redacted]d admitted for IOL for severe hyperemesis and found to have hypopoteinemia  #Labor: S/p cyto x8. Per nurse, [redacted]w[redacted]d Catheter remains in place. Consider more cytotec versus pitocin in AM #Pain: IV pain meds #FWB: Cat II, reassuring for moderate variability #GBS negative  chronic/other problems chronic/other problems: hyperemesis: zofran/phenergan per pt request  Anticipate Vaginal Delivery  Adriana Simas, MS3 5:05 AM  GME ATTESTATION:  I saw and evaluated the patient. I agree with the findings and the plan of care as documented in the student's note.  [name], DO OB Fellow, Faculty [facility], Center for [facility] Healthcare 02/22/2020 5:46 AM

## 2020-02-22 NOTE — Progress Notes (Signed)
   Michelle Vincent is a 29 y.o. G1P0000 at [redacted]w[redacted]d  admitted for induction of labor due to hyperemesis gravidarum.  Subjective: Uncomfortable with FB but much happier with epidural  Objective: Vitals:   02/22/20 1732 02/22/20 1735 02/22/20 1802 02/22/20 1831  BP: (!) 104/59  (!) 106/59 108/69  Pulse: 64  69 (!) 59  Resp:  16    Temp:  98.7 F (37.1 C)    TempSrc:      SpO2:      Weight:      Height:       No intake/output data recorded.  FHT:  FHR: 120 bpm, variability: moderate,  accelerations:  Present,  decelerations:  Absent UC:   1-2 SVE:   Difficult to examine around FB but patient feels like she's more than 1 cm around FB.  Pitocin @ 6 mu/min  Labs: Lab Results  Component Value Date   WBC 16.2 (H) 02/22/2020   HGB 9.1 (L) 02/22/2020   HCT 30.2 (L) 02/22/2020   MCV 76.6 (L) 02/22/2020   PLT 362 02/22/2020    Assessment / Plan: Protracted latent phase; starting pitocin now; will dsicuss with night team whether or not to remove FB and/or AROM.   Labor: early labor Fetal Wellbeing:  Category I Pain Control:  Epidural Anticipated MOD:  NSVD  Marylene Land 02/22/2020, 7:23 PM

## 2020-02-22 NOTE — Progress Notes (Addendum)
   Michelle Vincent is a 29 y.o. G1P0000 at [redacted]w[redacted]d  admitted for induction of labor due to hyperemesis gravidarum. She has FB placed at  0145 this morning.  Subjective:  Patient tired but accepting that this IOL  will take awhile.  Feels fine after 20 mg of propanolol.  Describes significant discomfort with FB.   Objective: Vitals:   02/22/20 0945 02/22/20 1024 02/22/20 1025 02/22/20 1054  BP: (!) 99/48  (!) 98/54 (!) 109/58  Pulse: 60  (!) 56 60  Resp:  18    Temp:      TempSrc:      SpO2:      Weight:      Height:       No intake/output data recorded.  FHT:  FHR: 115 bpm, variability: moderate,  accelerations:  Present,  decelerations:  Absent UC:   irregular, every  minutes SVE:   Dilation: Fingertip Effacement (%): 50 Station: -3 Exam by:: Dr. Salomon Mast Labs: Lab Results  Component Value Date   WBC 16.2 (H) 02/22/2020   HGB 9.1 (L) 02/22/2020   HCT 30.2 (L) 02/22/2020   MCV 76.6 (L) 02/22/2020   PLT 362 02/22/2020    Assessment / Plan: Coping well; would like to get epidural and start pitocin when FB comes out.  Will give fentanyl now as patient is weary and 5 mg of ambien for sleep as patient has not slept in two nights; wait 45 min after Fentanyl to give ambien.  Labor: Early labor; now with FB in place. Pain is a 7/10. Will give ambien for sleep.  Fetal Wellbeing:  Category I Pain Control:  Labor support without medications Anticipated MOD:  NSVD  Charlesetta Garibaldi [facility] 02/22/2020, 11:05 AM

## 2020-02-22 NOTE — Anesthesia Procedure Notes (Signed)
Epidural Patient location during procedure: OB Start time: 02/22/2020 4:19 PM End time: 02/22/2020 4:26 PM  Staffing Anesthesiologist: Mal Amabile, MD Performed: anesthesiologist   Preanesthetic Checklist Completed: patient identified, IV checked, site marked, risks and benefits discussed, surgical consent, monitors and equipment checked, pre-op evaluation and timeout performed  Epidural Patient position: sitting Prep: DuraPrep and site prepped and draped Patient monitoring: continuous pulse ox and blood pressure Approach: midline Location: L3-L4 Injection technique: LOR air  Needle:  Needle type: Tuohy  Needle gauge: 17 G Needle length: 9 cm and 9 Needle insertion depth: 5 cm cm Catheter type: closed end flexible Catheter size: 19 Gauge Catheter at skin depth: 10 cm Test dose: negative and Other  Assessment Events: blood not aspirated, injection not painful, no injection resistance, no paresthesia and negative IV test  Additional Notes Patient identified. Risks and benefits discussed including failed block, incomplete  Pain control, post dural puncture headache, nerve damage, paralysis, blood pressure Changes, nausea, vomiting, reactions to medications-both toxic and allergic and post Partum back pain. All questions were answered. Patient expressed understanding and wished to proceed. Sterile technique was used throughout procedure. Epidural site was Dressed with sterile barrier dressing. No paresthesias, signs of intravascular injection Or signs of intrathecal spread were encountered.  Patient was more comfortable after the epidural was dosed. Please see RN's note for documentation of vital signs and FHR which are stable. Reason for block:procedure for pain

## 2020-02-22 NOTE — Progress Notes (Signed)
   Michelle Vincent is a 29 y.o. G1P0000 at [redacted]w[redacted]d  admitted for induction of labor due to hyperemesis.  Subjective: Resting in bed, feels much better after nap.   Objective: Vitals:   02/22/20 1054 02/22/20 1211 02/22/20 1303 02/22/20 1409  BP: (!) 109/58 (!) 101/59 113/65 (!) 115/56  Pulse: 60 69 71 77  Resp:   17   Temp:   97.6 F (36.4 C)   TempSrc:   Oral   SpO2:      Weight:      Height:       No intake/output data recorded.  FHT:  FHR: 125 bpm, variability: moderate,  accelerations:  Present,  decelerations:  Absent UC:   Uterine irratability VE:   Dilation: Fingertip(CNM performed SVE around foley bulb) Effacement (%): 50 Station: -3 Exam by:: Michelle Vincent, CNM   Labs: Lab Results  Component Value Date   WBC 16.2 (H) 02/22/2020   HGB 9.1 (L) 02/22/2020   HCT 30.2 (L) 02/22/2020   MCV 76.6 (L) 02/22/2020   PLT 362 02/22/2020    Assessment / Plan: early labor; will start low dose pitocin. If patient becomes too uncomfortable, she may have epidural.  FB is still in place, can feel os is slightly open around FB but FB is not moving much when I pull on it.  Labor: will start low dose pitocin and give fluid bolus Fetal Wellbeing:  Category II Pain Control:  IV pain meds Anticipated MOD:  NSVD  Michelle Vincent 02/22/2020, 3:22 PM

## 2020-02-23 ENCOUNTER — Encounter (HOSPITAL_COMMUNITY): Payer: 59

## 2020-02-23 ENCOUNTER — Ambulatory Visit: Payer: 59 | Admitting: Family Medicine

## 2020-02-23 MED ORDER — ACETAMINOPHEN 500 MG PO TABS
1000.0000 mg | ORAL_TABLET | Freq: Once | ORAL | Status: AC
  Administered 2020-02-23: 1000 mg via ORAL
  Filled 2020-02-23: qty 2

## 2020-02-23 MED ORDER — MISOPROSTOL 50MCG HALF TABLET: 50.0000 ug | ORAL_TABLET | Freq: Once | ORAL | Status: DC

## 2020-02-23 MED ORDER — MISOPROSTOL 25 MCG QUARTER TABLET
25.0000 ug | ORAL_TABLET | Freq: Once | ORAL | Status: AC
  Administered 2020-02-23: 25 ug via VAGINAL
  Filled 2020-02-23: qty 1

## 2020-02-23 MED ORDER — OXYTOCIN 40 UNITS IN NORMAL SALINE INFUSION - SIMPLE MED
1.0000 m[IU]/min | INTRAVENOUS | Status: DC
  Administered 2020-02-23: 2 m[IU]/min via INTRAVENOUS
  Filled 2020-02-23: qty 1000

## 2020-02-23 NOTE — Progress Notes (Signed)
LABOR PROGRESS NOTE  Michelle Vincent is a 29 y.o. G1P0000 at [redacted]w[redacted]d  admitted for IOL for severe HG.  Subjective: Comfortable w epidural  Objective: BP 124/79   Pulse 62   Temp 97.9 F (36.6 C) (Oral)   Resp 16   Ht 5\' 7"  (1.702 m)   Wt 91.6 kg   LMP 06/01/2019   SpO2 99%   BMI 31.64 kg/m  or  Vitals:   02/23/20 2202 02/23/20 2207 02/23/20 2212 02/23/20 2217  BP: 124/76 133/84 128/74 124/79  Pulse: (!) 59 61 61 62  Resp:      Temp:      TempSrc:      SpO2:      Weight:      Height:         Dilation: 4 Effacement (%): 60 Cervical Position: Posterior Station: -2 Presentation: Vertex Exam by:: J.Cox, RN FHT: baseline rate 130, moderate varibility, +acel, -decel Toco: q2-3 min  Labs: Lab Results  Component Value Date   WBC 16.2 (H) 02/22/2020   HGB 9.1 (L) 02/22/2020   HCT 30.2 (L) 02/22/2020   MCV 76.6 (L) 02/22/2020   PLT 362 02/22/2020    Patient Active Problem List   Diagnosis Date Noted  . Anxiety 02/20/2020  . 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 Stress reaction of bone 01/26/2020  . Gallbladder polyp 12/25/2019  . Hyperemesis gravidarum 08/29/2019  . Supervision of normal first pregnancy, antepartum 08/12/2019  . 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 Mild intermittent asthma without complication 12/27/2017  . Chronic migraine without aura without status migrainosus, not intractable 05/24/2017  . 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 G1P0000 at [redacted]w[redacted]d here for IOL for severe HG.  Labor: prolonged IOL started on 4/16 in AM, s/p Miso x8, FB, Pit>pit break>pit restart at 1830, still not yet in active labor. Discussed AROM including risk of infection/cord prolapse, patient is amenable. AROM for scant meconium stained fluid, IUPC placed posteriorly @ 2300 (anterior placenta on most recent 5/16). Cont pitocin augmentation, recheck once she has had 4 hours of adequate contractions.  Fetal Wellbeing:   Cat I Pain Control:  epidural GBS: neg Anticipated MOD:  SVD   [place], MD/MPH OB Fellow  02/23/2020, 10:48 PM

## 2020-02-23 NOTE — Progress Notes (Signed)
Michelle Vincent is a 29 y.o. G1P0000 at [redacted]w[redacted]d   Subjective: Patient relaxing in rocking chair, s/p small snack. Verbalizes to RN she feels much better. Looking forward to brief shower shortly.  Objective: BP 104/67   Pulse 73   Temp 98.9 F (37.2 C) (Oral)   Resp 17   Ht 5\' 7"  (1.702 m)   Wt 91.6 kg   LMP 06/01/2019   SpO2 99%   BMI 31.64 kg/m  I/O last 3 completed shifts: In: -  Out: 1350 [Urine:1350] Total I/O In: 10627.4 [I.V.:10627.4] Out: 600 [Urine:600]  FHT:  FHR: 125 bpm, variability: moderate,  accelerations:  Present,  decelerations:  Absent UC:   None,UI noted SVE:   Dilation: 3 Effacement (%): 50 Station: -2 Exam by:: Dr. [email or web address]  Labs: Lab Results  Component Value Date   WBC 16.2 (H) 02/22/2020   HGB 9.1 (L) 02/22/2020   HCT 30.2 (L) 02/22/2020   MCV 76.6 (L) 02/22/2020   PLT 362 02/22/2020    Assessment / Plan: --Cat I tracing --Pit break initiated at 0830 --Plan to place 25 mcg Cytotec PV at 1430 --Restart Pitocin at 2 milliunits at 1830, Titrate 2 x 2 --Assess for AROM and IUPC with subsequent checks --Anticipate NSVD  02/24/2020, CNM 02/23/2020, 12:50 PM

## 2020-02-23 NOTE — Progress Notes (Addendum)
Michelle Vincent is a 29 y.o. G1P0000 at [redacted]w[redacted]d   Subjective: Coping very well with timeline for induction process. Denies pain. FOB at bedside and involved in care.  Objective: BP 120/67   Pulse 77   Temp 98.4 F (36.9 C) (Oral)   Resp 17   Ht 5\' 7"  (1.702 m)   Wt 91.6 kg   LMP 06/01/2019   SpO2 99%   BMI 31.64 kg/m  I/O last 3 completed shifts: In: -  Out: 1350 [Urine:1350] No intake/output data recorded.  FHT:  FHR: 125 bpm, variability: moderate,  accelerations:  Present,  decelerations:  Absent UC:   irregular, every 2-5 minutes SVE:   Dilation: 3 Effacement (%): 50 Station: -2 Exam by:: Dr. [email or web address]  Labs: Lab Results  Component Value Date   WBC 16.2 (H) 02/22/2020   HGB 9.1 (L) 02/22/2020   HCT 30.2 (L) 02/22/2020   MCV 76.6 (L) 02/22/2020   PLT 362 02/22/2020    Assessment / Plan: --Dr. 02/24/2020 at bedside to discuss options for plan of care --Patient given option for Pitocin break, restart at 5pm vs. discharge from hospital, return for IOL in one week --Patient elects Pit break --Per Dr. Alysia Penna, plan to place vaginal Cytotec at 2:30pm, restart Pitocin with new bag and assess for AROM at 6:30pm --Patient and FOB in agreement with this plan --Patient may cap off epidural to allow for shower and meal if approved by Anesthesia --SCDs ordered --Ambien ordered --Greater than 15 minutes spent at bedside by Dr. Alysia Penna and Alysia Penna, CNM 02/23/2020, 8:38 AM

## 2020-02-23 NOTE — Progress Notes (Addendum)
Labor Progress Note Michelle Vincent is a 29 y.o. G1P0000 at [redacted]w[redacted]d presented for IOL due to hyperemesis gravidarum S: Pt resting. Per nurse, foley is still in place.    O:  BP (!) 103/52   Pulse 65   Temp 98.8 F (37.1 C) (Oral)   Resp 16   Ht 5\' 7"  (1.702 m)   Wt 91.6 kg   LMP 06/01/2019   SpO2 99%   BMI 31.64 kg/m  EFM: 120 bpm/moderate variability/15x15 accelerations present/ no decelerations  CVE: Dilation: 2 Effacement (%): 50 Cervical Position: Posterior Station: -2 Presentation: Vertex Exam by:: Dr. [email or web address]   [name]: 29 y.o. G1P0000 [redacted]w[redacted]d IOL due to hyperemesis gravidarum #Labor: Foley balloon still in place per Rn, will check again in ~ 4 hours. Continue Pitocin #Pain: Epidural #FWB: Cat I #GBS negative chronic/other problems: Hyperemesis  zofran/phenergan per pt request  [redacted]w[redacted]d, MS3 2:42 AM  GME ATTESTATION:  I saw and evaluated the patient. I agree with the findings and the plan of care as documented in the student's note.  [name], DO OB Fellow, Faculty [facility], Center for [facility] Healthcare 02/23/2020 5:20 AM

## 2020-02-23 NOTE — Progress Notes (Signed)
Michelle Vincent is a 29 y.o. G1P0000 at [redacted]w[redacted]d   Subjective: Patient feeling much better after break, meal and shower. Gently communicating concern that her plan of care changes with each 0800 shift and she is becoming more fatigued as time wears on.  Objective: BP 122/80   Pulse 87   Temp 98.9 F (37.2 C) (Oral)   Resp 17   Ht 5\' 7"  (1.702 m)   Wt 91.6 kg   LMP 06/01/2019   SpO2 99%   BMI 31.64 kg/m  I/O last 3 completed shifts: In: -  Out: 1350 [Urine:1350] Total I/O In: 10627.4 [I.V.:10627.4] Out: 600 [Urine:600]  FHT:  FHR: 135 bpm, variability: moderate,  accelerations:  Present,  decelerations:  Absent UC:   irregular, every 8 minutes SVE:   Dilation: 3 Effacement (%): 50 Station: -2 Exam by:: [email or web address], CNM  Labs: Lab Results  Component Value Date   WBC 16.2 (H) 02/22/2020   HGB 9.1 (L) 02/22/2020   HCT 30.2 (L) 02/22/2020   MCV 76.6 (L) 02/22/2020   PLT 362 02/22/2020    Assessment / Plan: --Cat I tracing --Per plan discussed with Dr. 02/24/2020, 25 mcg Cytotec placed per vagina --Plan to restart Pitocin at 1830 --Revisited earlier statement that alternate delivery method may be indicated if no SVD 18-24 hours after ROM --Anticipate NSVD  Alysia Penna, CNM 02/23/2020, 2:40 PM

## 2020-02-23 NOTE — Progress Notes (Signed)
Spoke with Michelle Vincent about pt plan for cont IOL and that currently we are stopping all meds for induction and taking a break until dinner time. Got OK to D/C epidural infusion and will restart at later time if pt is hurting.

## 2020-02-23 NOTE — Progress Notes (Signed)
Michelle Vincent is a 29 y.o. G1P0000 at [redacted]w[redacted]d   Subjective: Feeling more positive and rested. Denies questions or complaints. Continues to have concern for change in plan of care if not delivered by day shift Tuesday 02/24/2020.  Objective: BP 127/73   Pulse 67   Temp 98 F (36.7 C) (Oral)   Resp 17   Ht 5\' 7"  (1.702 m)   Wt 91.6 kg   LMP 06/01/2019   SpO2 99%   BMI 31.64 kg/m  I/O last 3 completed shifts: In: -  Out: 1350 [Urine:1350] Total I/O In: 10627.4 [I.V.:10627.4] Out: 600 [Urine:600]  FHT:  FHR: 145 bpm, variability: moderate,  accelerations:  Present,  decelerations:  Absent UC:  Rare, UI SVE:   Dilation: 4 Effacement (%): 60 Station: -2 Exam by:: J.Cox, RN  Labs: Lab Results  Component Value Date   WBC 16.2 (H) 02/22/2020   HGB 9.1 (L) 02/22/2020   HCT 30.2 (L) 02/22/2020   MCV 76.6 (L) 02/22/2020   PLT 362 02/22/2020    Assessment / Plan: --S/p Cytotec (9 of 9) at 1423 --Restart Pitocin at 2 milliunits, titrate 2 x 2 --Assess for AROM with next exam --Anticipate NSVD   02/24/2020 02/23/2020, 6:31 PM

## 2020-02-23 NOTE — Progress Notes (Deleted)
  Michelle Vincent - 29 y.o. female MRN [id number]  Date of birth: 29-Oct-1991  SUBJECTIVE:  Including CC & ROS.  No chief complaint on file.   Michelle Vincent is a 29 y.o. female that is  ***.  ***   Review of Systems See HPI   HISTORY: Past Medical, Surgical, Social, and Family History Reviewed & Updated per EMR.   Pertinent Historical Findings include:  Past Medical History:  Diagnosis Date  . Anemia   . Anxiety    takes lexapro  . Asthma    prn inhaler use  . HA (headache)   . Migraine   . Occipital neuralgia   . Sinus tachycardia   . SVT (supraventricular tachycardia) (HCC)     Past Surgical History:  Procedure Laterality Date  . shoulder Right   . TONSILLECTOMY AND ADENOIDECTOMY    . tubes in ears      Family History  Problem Relation Age of Onset  . Hypertension Mother   . Transient ischemic attack Mother   . Diabetes Brother        Type 1  . Breast cancer Maternal Aunt   . Cancer Maternal Aunt     Social History   Socioeconomic History  . Marital status: Single    Spouse name: Not on file  . Number of children: Not on file  . Years of education: Not on file  . Highest education level: Not on file  Occupational History  . Not on file  Tobacco Use  . Smoking status: Never Smoker  . Smokeless tobacco: Never Used  Substance and Sexual Activity  . Alcohol use: Not Currently  . Drug use: Never  . Sexual activity: Yes  Other Topics Concern  . Not on file  Social History Narrative   EMT   Social Determinants of Health   Financial Resource Strain:   . Difficulty of Paying Living Expenses:   Food Insecurity:   . Worried About Programme researcher, broadcasting/film/video in the Last Year:   . Barista in the Last Year:   Transportation Needs:   . Freight forwarder (Medical):   Marland Kitchen Lack of Transportation (Non-Medical):   Physical Activity:   . Days of Exercise per Week:   . Minutes of Exercise per Session:   Stress:   . Feeling of Stress :     Social Connections:   . Frequency of Communication with Friends and Family:   . Frequency of Social Gatherings with Friends and Family:   . Attends Religious Services:   . Active Member of Clubs or Organizations:   . Attends Banker Meetings:   Marland Kitchen Marital Status:   Intimate Partner Violence:   . Fear of Current or Ex-Partner:   . Emotionally Abused:   Marland Kitchen Physically Abused:   . Sexually Abused:      PHYSICAL EXAM:  VS: LMP 06/01/2019  Physical Exam Gen: NAD, alert, cooperative with exam, well-appearing MSK:  ***      ASSESSMENT & PLAN:   No problem-specific Assessment & Plan notes found for this encounter.

## 2020-02-23 NOTE — Progress Notes (Addendum)
Labor Progress Note [name] is a 29 y.o. G1P0000 at [redacted]w[redacted]d presented for IOL due to hyperemesis gravidarum. S: PT resting, no complaints at this time.  O:  BP (!) 100/55   Pulse 61   Temp 98.4 F (36.9 C) (Oral)   Resp 16   Ht 5\' 7"  (1.702 m)   Wt 91.6 kg   LMP 06/01/2019   SpO2 99%   BMI 31.64 kg/m  EFM: 120bpm/moderate variability/15x15 accelerations present/ decelerations absent/ contractions 2-6 min apart, irregular  CVE: Dilation: 3 Effacement (%): 50 Cervical Position: Posterior Station: -2 Presentation: Vertex Exam by:: Dr. [email or web address]   [name]: 29 y.o. G1P0000 [redacted]w[redacted]d IOL due to hyperemesis gravidarum. #Labor: Progressing. Foley fell out. Continue on Pitocin. Reassess in ~4hr #Pain: Epidural #FWB: Cat I #GBS negative chronic/other problems: Hyperemesis  zofran/phenergan per pt request  [redacted]w[redacted]d, MS3 5:10 AM  GME ATTESTATION:  I saw and evaluated the patient. I agree with the findings and the plan of care as documented in the student's note.  Now s/p cyto x8, cooks and FB, Pit at 20 (started yesterday afternoon).  [name], DO OB Fellow, Faculty [facility], [facility] 02/23/2020 5:20 AM

## 2020-02-24 ENCOUNTER — Encounter (HOSPITAL_COMMUNITY)
Admission: AD | Disposition: A | Discharge status: Home / Self Care | Payer: Self-pay | Source: Home / Self Care | Attending: Family Medicine

## 2020-02-24 ENCOUNTER — Encounter (HOSPITAL_COMMUNITY): Payer: Self-pay | Admitting: Family Medicine

## 2020-02-24 DIAGNOSIS — Z3A37 37 weeks gestation of pregnancy: Secondary | ICD-10-CM

## 2020-02-24 DIAGNOSIS — R000 Tachycardia, unspecified: Secondary | ICD-10-CM

## 2020-02-24 DIAGNOSIS — O210 Mild hyperemesis gravidarum: Secondary | ICD-10-CM

## 2020-02-24 DIAGNOSIS — Z98891 History of uterine scar from previous surgery: Secondary | ICD-10-CM

## 2020-02-24 SURGERY — Surgical Case
Anesthesia: Epidural | Site: Abdomen | Wound class: Clean Contaminated

## 2020-02-24 MED ORDER — ONDANSETRON HCL 4 MG/2ML IJ SOLN
INTRAMUSCULAR | Status: AC
  Filled 2020-02-24: qty 2

## 2020-02-24 MED ORDER — METOCLOPRAMIDE HCL 5 MG/ML IJ SOLN
INTRAMUSCULAR | Status: AC
  Filled 2020-02-24: qty 2

## 2020-02-24 MED ORDER — CEFAZOLIN SODIUM-DEXTROSE 2-3 GM-%(50ML) IV SOLR
INTRAVENOUS | Status: DC | PRN
  Administered 2020-02-24: 2 g via INTRAVENOUS

## 2020-02-24 MED ORDER — OXYCODONE HCL 5 MG PO TABS: 5.0000 mg | ORAL_TABLET | Freq: Once | ORAL | Status: DC | PRN

## 2020-02-24 MED ORDER — ACETAMINOPHEN 500 MG PO TABS
1000.0000 mg | ORAL_TABLET | Freq: Four times a day (QID) | ORAL | Status: DC
  Administered 2020-02-24 – 2020-02-26 (×8): 1000 mg via ORAL
  Filled 2020-02-24 (×8): qty 2

## 2020-02-24 MED ORDER — MENTHOL 3 MG MT LOZG: 1.0000 | LOZENGE | OROMUCOSAL | Status: DC | PRN

## 2020-02-24 MED ORDER — MORPHINE SULFATE (PF) 0.5 MG/ML IJ SOLN
INTRAMUSCULAR | Status: AC
  Filled 2020-02-24: qty 10

## 2020-02-24 MED ORDER — DIBUCAINE (PERIANAL) 1 % EX OINT: 1.0000 "application " | TOPICAL_OINTMENT | CUTANEOUS | Status: DC | PRN

## 2020-02-24 MED ORDER — TETANUS-DIPHTH-ACELL PERTUSSIS 5-2.5-18.5 LF-MCG/0.5 IM SUSP: 0.5000 mL | Freq: Once | INTRAMUSCULAR | Status: DC

## 2020-02-24 MED ORDER — OXYTOCIN 40 UNITS IN NORMAL SALINE INFUSION - SIMPLE MED: 2.5000 [IU]/h | INTRAVENOUS | Status: AC

## 2020-02-24 MED ORDER — ACETAMINOPHEN 160 MG/5ML PO SOLN: 325.0000 mg | ORAL | Status: DC | PRN

## 2020-02-24 MED ORDER — SIMETHICONE 80 MG PO CHEW: 80.0000 mg | CHEWABLE_TABLET | ORAL | Status: DC | PRN

## 2020-02-24 MED ORDER — TRANEXAMIC ACID-NACL 1000-0.7 MG/100ML-% IV SOLN
INTRAVENOUS | Status: DC | PRN
  Administered 2020-02-24: 1000 mg via INTRAVENOUS

## 2020-02-24 MED ORDER — LACTATED RINGERS AMNIOINFUSION: INTRAVENOUS | Status: DC

## 2020-02-24 MED ORDER — OXYCODONE HCL 5 MG PO TABS
5.0000 mg | ORAL_TABLET | ORAL | Status: DC | PRN
  Administered 2020-02-25: 23:00:00 10 mg via ORAL
  Filled 2020-02-24: qty 2

## 2020-02-24 MED ORDER — OXYCODONE HCL 5 MG/5ML PO SOLN: 5.0000 mg | Freq: Once | ORAL | Status: DC | PRN

## 2020-02-24 MED ORDER — ACETAMINOPHEN 325 MG PO TABS: 325.0000 mg | ORAL_TABLET | ORAL | Status: DC | PRN

## 2020-02-24 MED ORDER — DIPHENHYDRAMINE HCL 25 MG PO CAPS: 25.0000 mg | ORAL_CAPSULE | Freq: Four times a day (QID) | ORAL | Status: DC | PRN

## 2020-02-24 MED ORDER — PRENATAL MULTIVITAMIN CH
1.0000 | ORAL_TABLET | Freq: Every day | ORAL | Status: DC
  Administered 2020-02-25 – 2020-02-26 (×2): 1 via ORAL
  Filled 2020-02-24 (×2): qty 1

## 2020-02-24 MED ORDER — MORPHINE SULFATE (PF) 10 MG/ML IV SOLN
INTRAVENOUS | Status: DC | PRN
  Administered 2020-02-24: 3 mg via EPIDURAL

## 2020-02-24 MED ORDER — CEFAZOLIN SODIUM-DEXTROSE 2-4 GM/100ML-% IV SOLN: 2.0000 g | Freq: Once | INTRAVENOUS | Status: AC

## 2020-02-24 MED ORDER — IBUPROFEN 800 MG PO TABS
800.0000 mg | ORAL_TABLET | Freq: Four times a day (QID) | ORAL | Status: DC
  Administered 2020-02-25 – 2020-02-26 (×5): 800 mg via ORAL
  Filled 2020-02-24 (×6): qty 1

## 2020-02-24 MED ORDER — ESCITALOPRAM OXALATE 10 MG PO TABS
10.0000 mg | ORAL_TABLET | Freq: Every day | ORAL | Status: DC
  Administered 2020-02-24 – 2020-02-25 (×2): 10 mg via ORAL
  Filled 2020-02-24 (×2): qty 1

## 2020-02-24 MED ORDER — DEXAMETHASONE SODIUM PHOSPHATE 4 MG/ML IJ SOLN
INTRAMUSCULAR | Status: AC
  Filled 2020-02-24: qty 1

## 2020-02-24 MED ORDER — FUROSEMIDE 10 MG/ML IJ SOLN
20.0000 mg | Freq: Once | INTRAMUSCULAR | Status: AC
  Administered 2020-02-24: 20 mg via INTRAVENOUS
  Filled 2020-02-24: qty 4

## 2020-02-24 MED ORDER — LACTATED RINGERS IV SOLN: INTRAVENOUS | Status: DC

## 2020-02-24 MED ORDER — ZOLPIDEM TARTRATE 5 MG PO TABS: 5.0000 mg | ORAL_TABLET | Freq: Every evening | ORAL | Status: DC | PRN

## 2020-02-24 MED ORDER — ONDANSETRON HCL 4 MG/2ML IJ SOLN
4.0000 mg | Freq: Once | INTRAMUSCULAR | Status: AC | PRN
  Administered 2020-02-24: 4 mg via INTRAVENOUS

## 2020-02-24 MED ORDER — MEPERIDINE HCL 25 MG/ML IJ SOLN: 6.2500 mg | INTRAMUSCULAR | Status: DC | PRN

## 2020-02-24 MED ORDER — LIDOCAINE-EPINEPHRINE (PF) 2 %-1:200000 IJ SOLN
INTRAMUSCULAR | Status: AC
  Filled 2020-02-24: qty 10

## 2020-02-24 MED ORDER — SODIUM CHLORIDE 0.9 % IV SOLN
INTRAVENOUS | Status: AC
  Filled 2020-02-24: qty 500

## 2020-02-24 MED ORDER — LACTATED RINGERS IV SOLN: INTRAVENOUS | Status: DC | PRN

## 2020-02-24 MED ORDER — SODIUM CHLORIDE 0.9 % IR SOLN
Status: DC | PRN
  Administered 2020-02-24: 1

## 2020-02-24 MED ORDER — FUROSEMIDE 10 MG/ML IJ SOLN
INTRAMUSCULAR | Status: DC | PRN
  Administered 2020-02-24: 20 mg via INTRAMUSCULAR

## 2020-02-24 MED ORDER — METOCLOPRAMIDE HCL 5 MG/ML IJ SOLN
INTRAMUSCULAR | Status: DC | PRN
  Administered 2020-02-24: 10 mg via INTRAVENOUS

## 2020-02-24 MED ORDER — FAMOTIDINE 20 MG PO TABS
20.0000 mg | ORAL_TABLET | Freq: Two times a day (BID) | ORAL | Status: DC
  Administered 2020-02-24 – 2020-02-26 (×4): 20 mg via ORAL
  Filled 2020-02-24 (×4): qty 1

## 2020-02-24 MED ORDER — ENOXAPARIN SODIUM 60 MG/0.6ML ~~LOC~~ SOLN
50.0000 mg | SUBCUTANEOUS | Status: DC
  Administered 2020-02-25 – 2020-02-26 (×2): 50 mg via SUBCUTANEOUS
  Filled 2020-02-24 (×2): qty 0.6

## 2020-02-24 MED ORDER — SODIUM CHLORIDE 0.9 % IV SOLN: INTRAVENOUS | Status: DC | PRN

## 2020-02-24 MED ORDER — SODIUM CHLORIDE 0.9 % IV SOLN
500.0000 mg | Freq: Once | INTRAVENOUS | Status: AC
  Administered 2020-02-24: 500 mg via INTRAVENOUS

## 2020-02-24 MED ORDER — MEASLES, MUMPS & RUBELLA VAC IJ SOLR: 0.5000 mL | Freq: Once | INTRAMUSCULAR | Status: DC

## 2020-02-24 MED ORDER — STERILE WATER FOR IRRIGATION IR SOLN
Status: DC | PRN
  Administered 2020-02-24: 1

## 2020-02-24 MED ORDER — LIDOCAINE-EPINEPHRINE (PF) 2 %-1:200000 IJ SOLN
INTRAMUSCULAR | Status: DC | PRN
  Administered 2020-02-24 (×3): 5 mL via EPIDURAL

## 2020-02-24 MED ORDER — SENNOSIDES-DOCUSATE SODIUM 8.6-50 MG PO TABS
2.0000 | ORAL_TABLET | ORAL | Status: DC
  Administered 2020-02-24 – 2020-02-25 (×2): 2 via ORAL
  Filled 2020-02-24 (×2): qty 2

## 2020-02-24 MED ORDER — WITCH HAZEL-GLYCERIN EX PADS: 1.0000 "application " | MEDICATED_PAD | CUTANEOUS | Status: DC | PRN

## 2020-02-24 MED ORDER — FENTANYL CITRATE (PF) 100 MCG/2ML IJ SOLN
25.0000 ug | INTRAMUSCULAR | Status: DC | PRN
  Administered 2020-02-24: 25 ug via INTRAVENOUS
  Administered 2020-02-24: 50 ug via INTRAVENOUS
  Administered 2020-02-24: 25 ug via INTRAVENOUS

## 2020-02-24 MED ORDER — OXYTOCIN 40 UNITS IN NORMAL SALINE INFUSION - SIMPLE MED
INTRAVENOUS | Status: AC
  Filled 2020-02-24: qty 1000

## 2020-02-24 MED ORDER — GABAPENTIN 100 MG PO CAPS
100.0000 mg | ORAL_CAPSULE | Freq: Two times a day (BID) | ORAL | Status: DC
  Administered 2020-02-24 – 2020-02-26 (×4): 100 mg via ORAL
  Filled 2020-02-24 (×4): qty 1

## 2020-02-24 MED ORDER — KETOROLAC TROMETHAMINE 30 MG/ML IJ SOLN
30.0000 mg | Freq: Four times a day (QID) | INTRAMUSCULAR | Status: AC
  Administered 2020-02-24 – 2020-02-25 (×3): 30 mg via INTRAVENOUS
  Filled 2020-02-24 (×3): qty 1

## 2020-02-24 MED ORDER — DEXAMETHASONE SODIUM PHOSPHATE 4 MG/ML IJ SOLN
INTRAMUSCULAR | Status: DC | PRN
  Administered 2020-02-24: 4 mg via INTRAVENOUS

## 2020-02-24 MED ORDER — SIMETHICONE 80 MG PO CHEW
80.0000 mg | CHEWABLE_TABLET | Freq: Three times a day (TID) | ORAL | Status: DC
  Administered 2020-02-24 – 2020-02-26 (×5): 80 mg via ORAL
  Filled 2020-02-24 (×5): qty 1

## 2020-02-24 MED ORDER — SIMETHICONE 80 MG PO CHEW
80.0000 mg | CHEWABLE_TABLET | ORAL | Status: DC
  Administered 2020-02-24 – 2020-02-25 (×2): 80 mg via ORAL
  Filled 2020-02-24 (×2): qty 1

## 2020-02-24 MED ORDER — PROPRANOLOL HCL 20 MG PO TABS
20.0000 mg | ORAL_TABLET | Freq: Two times a day (BID) | ORAL | Status: DC
  Administered 2020-02-24 – 2020-02-26 (×4): 20 mg via ORAL
  Filled 2020-02-24 (×5): qty 1

## 2020-02-24 MED ORDER — COCONUT OIL OIL
1.0000 "application " | TOPICAL_OIL | Status: DC | PRN
  Administered 2020-02-25: 1 via TOPICAL

## 2020-02-24 MED ORDER — FUROSEMIDE 10 MG/ML IJ SOLN
INTRAMUSCULAR | Status: AC
  Filled 2020-02-24: qty 2

## 2020-02-24 MED ORDER — FENTANYL CITRATE (PF) 100 MCG/2ML IJ SOLN
INTRAMUSCULAR | Status: AC
  Filled 2020-02-24: qty 2

## 2020-02-24 MED ORDER — PHENYLEPHRINE HCL-NACL 20-0.9 MG/250ML-% IV SOLN
INTRAVENOUS | Status: AC
  Filled 2020-02-24: qty 250

## 2020-02-24 SURGICAL SUPPLY — 35 items
BENZOIN TINCTURE PRP APPL 2/3 (GAUZE/BANDAGES/DRESSINGS) ×3 IMPLANT
CHLORAPREP W/TINT 26ML (MISCELLANEOUS) ×3 IMPLANT
CLAMP CORD UMBIL (MISCELLANEOUS) IMPLANT
CLOSURE STERI STRIP 1/2 X4 (GAUZE/BANDAGES/DRESSINGS) ×2 IMPLANT
CLOSURE WOUND 1/2 X4 (GAUZE/BANDAGES/DRESSINGS) ×1
CLOTH BEACON ORANGE TIMEOUT ST (SAFETY) ×3 IMPLANT
DRSG OPSITE POSTOP 4X10 (GAUZE/BANDAGES/DRESSINGS) ×3 IMPLANT
ELECT REM PT RETURN 9FT ADLT (ELECTROSURGICAL) ×3
ELECTRODE REM PT RTRN 9FT ADLT (ELECTROSURGICAL) ×1 IMPLANT
EXTRACTOR VACUUM M CUP 4 TUBE (SUCTIONS) IMPLANT
EXTRACTOR VACUUM M CUP 4' TUBE (SUCTIONS)
GAUZE SPONGE 4X4 12PLY STRL LF (GAUZE/BANDAGES/DRESSINGS) ×3 IMPLANT
GLOVE BIOGEL PI IND STRL 7.0 (GLOVE) ×2 IMPLANT
GLOVE BIOGEL PI IND STRL 7.5 (GLOVE) ×2 IMPLANT
GLOVE BIOGEL PI INDICATOR 7.0 (GLOVE) ×4
GLOVE BIOGEL PI INDICATOR 7.5 (GLOVE) ×4
GLOVE ECLIPSE 7.5 STRL STRAW (GLOVE) ×3 IMPLANT
GOWN STRL REUS W/TWL LRG LVL3 (GOWN DISPOSABLE) ×9 IMPLANT
KIT ABG SYR 3ML LUER SLIP (SYRINGE) IMPLANT
NEEDLE HYPO 25X5/8 SAFETYGLIDE (NEEDLE) ×3 IMPLANT
NS IRRIG 1000ML POUR BTL (IV SOLUTION) ×3 IMPLANT
PACK C SECTION WH (CUSTOM PROCEDURE TRAY) ×3 IMPLANT
PAD ABD 7.5X8 STRL (GAUZE/BANDAGES/DRESSINGS) ×3 IMPLANT
PAD OB MATERNITY 4.3X12.25 (PERSONAL CARE ITEMS) ×3 IMPLANT
PENCIL SMOKE EVAC W/HOLSTER (ELECTROSURGICAL) ×3 IMPLANT
RTRCTR C-SECT PINK 25CM LRG (MISCELLANEOUS) ×3 IMPLANT
STRIP CLOSURE SKIN 1/2X4 (GAUZE/BANDAGES/DRESSINGS) ×2 IMPLANT
SUT VIC AB 0 CTX 36 (SUTURE) ×6
SUT VIC AB 0 CTX36XBRD ANBCTRL (SUTURE) ×3 IMPLANT
SUT VIC AB 2-0 CT1 27 (SUTURE) ×2
SUT VIC AB 2-0 CT1 TAPERPNT 27 (SUTURE) ×1 IMPLANT
SUT VIC AB 4-0 KS 27 (SUTURE) ×3 IMPLANT
TOWEL OR 17X24 6PK STRL BLUE (TOWEL DISPOSABLE) ×3 IMPLANT
TRAY FOLEY W/BAG SLVR 14FR LF (SET/KITS/TRAYS/PACK) ×3 IMPLANT
WATER STERILE IRR 1000ML POUR (IV SOLUTION) ×3 IMPLANT

## 2020-02-24 NOTE — Progress Notes (Signed)
Patient ID: Michelle Vincent, female   DOB: 1991/05/15, 29 y.o.   MRN: [id number]  No cervical change. Having some lates. Patient amenable to cesarean delivery.  The risks of cesarean section discussed with the patient included but were not limited to: bleeding which may require transfusion or reoperation; infection which may require antibiotics; injury to bowel, bladder, ureters or other surrounding organs; injury to the fetus; need for additional procedures including hysterectomy in the event of a life-threatening hemorrhage; placental abnormalities wth subsequent pregnancies, incisional problems, thromboembolic phenomenon and other postoperative/anesthesia complications. The patient concurred with the proposed plan, giving informed written consent for the procedure.   Patient has been NPO since last night, she will remain NPO for procedure. Anesthesia and OR aware.  Preoperative prophylactic Ancef and azithromycin ordered on call to the OR.  To OR when ready.  Levie Heritage, DO 02/24/2020 12:52 PM

## 2020-02-24 NOTE — Progress Notes (Signed)
LABOR PROGRESS NOTE  Michelle Vincent is a 29 y.o. G1P0000 at [redacted]w[redacted]d  admitted for IOL for severe HG.  Subjective: Comfortable w epidural, not feeling contractions  Objective: BP 124/74   Pulse 75   Temp 97.7 F (36.5 C) (Oral)   Resp 17   Ht 5\' 7"  (1.702 m)   Wt 91.6 kg   LMP 06/01/2019   SpO2 99%   BMI 31.64 kg/m  or  Vitals:   02/24/20 0302 02/24/20 0332 02/24/20 0402 02/24/20 0409  BP: 121/87 135/88 (!) 136/114 124/74  Pulse: 68 77 79 75  Resp: 17 18 17 17   Temp:  97.7 F (36.5 C)    TempSrc:  Oral    SpO2:      Weight:      Height:         Dilation: 4 Effacement (%): 80 Cervical Position: Posterior Station: -1 Presentation: Vertex Exam by:: Tyra Michelle FHT: baseline rate 130, moderate varibility, -acel, intermittent variable and late decel Toco: q2-3 min, MVU 155-190  Labs: Lab Results  Component Value Date   WBC 16.2 (H) 02/22/2020   HGB 9.1 (L) 02/22/2020   HCT 30.2 (L) 02/22/2020   MCV 76.6 (L) 02/22/2020   PLT 362 02/22/2020    Patient Active Problem List   Diagnosis Date Noted  . Anxiety 02/20/2020  . 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 Stress reaction of bone 01/26/2020  . Gallbladder polyp 12/25/2019  . Hyperemesis gravidarum 08/29/2019  . Supervision of normal first pregnancy, antepartum 08/12/2019  . 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 Mild intermittent asthma without complication 12/27/2017  . Chronic migraine without aura without status migrainosus, not intractable 05/24/2017  . 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 G1P0000 at [redacted]w[redacted]d here for IOL for severe HG.  Labor: prolonged IOL started on 4/16 in AM, s/p Miso x8, FB, Pit>pit break>pit restart at 1830, AROM for mec at 2300. Has made definite progress with effacement and descent, cont pitocin augmentation. Initially had adequate contractions but then decreased pitocin from 12>6 due to lates/tachysystole, currently at 8 but will  cont to uptitrate. Extended discussion with patient and partner at time of AROM regarding timeline/expectations.  Fetal Wellbeing:  Cat II for intermittent lates and variables, will watch closely given pitocin augmentation but overall reassuring with moderate variability Pain Control:  epidural GBS: neg Anticipated MOD:  SVD   [redacted]w[redacted]d, MD/MPH OB Fellow  02/24/2020, 4:31 AM

## 2020-02-24 NOTE — Op Note (Signed)
Cesarean Section Operative Report  PATIENT: Michelle Vincent  PROCEDURE DATE: 02/24/2020  PREOPERATIVE DIAGNOSES: Intrauterine pregnancy at [redacted]w[redacted]d weeks gestation; failure to progress: arrest of dilation  POSTOPERATIVE DIAGNOSES: The same  PROCEDURE: Primary Low Transverse Cesarean Section  SURGEON:   Surgeon(s) and Role:    * Truett Mainland, DO - Primary    * Nicolette Bang, DO - Assisting - Attending      INDICATIONS: Michelle Vincent is a 29 y.o. G1P0000 at [redacted]w[redacted]d here for cesarean section secondary to the indications listed under preoperative diagnoses; please see preoperative note for further details.  The risks of cesarean section were discussed with the patient including but were not limited to: bleeding which may require transfusion or reoperation; infection which may require antibiotics; injury to bowel, bladder, ureters or other surrounding organs; injury to the fetus; need for additional procedures including hysterectomy in the event of a life-threatening hemorrhage; placental abnormalities wth subsequent pregnancies, incisional problems, thromboembolic phenomenon and other postoperative/anesthesia complications.   The patient concurred with the proposed plan, giving informed written consent for the procedure.    FINDINGS:  Viable female infant in cephalic presentation, OP.  Apgars 8 and 9.  Meconium stained amniotic fluid.  Intact placenta, three vessel cord.  Normal uterus, fallopian tubes and ovaries bilaterally.  ANESTHESIA: Epidural INTRAVENOUS FLUIDS: 500 mL  ESTIMATED BLOOD LOSS: 471 mL URINE OUTPUT:  1250 ml SPECIMENS: Placenta sent to L&D COMPLICATIONS: None immediate  PROCEDURE IN DETAIL:  The patient preoperatively received intravenous antibiotics and had sequential compression devices applied to her lower extremities.  She was then taken to the operating room where the epidural anesthesia was dosed up to surgical level and was found to be adequate.  She was then placed in a dorsal supine position with a leftward tilt, and prepped and draped in a sterile manner.  A foley catheter was placed into her bladder and attached to constant gravity.    After an adequate timeout was performed, a Pfannenstiel skin incision was made with scalpel and carried through to the underlying layer of fascia. The fascia was incised in the midline, and this incision was extended bilaterally using the Mayo scissors.  Kocher clamps were applied to the superior aspect of the fascial incision and the underlying rectus muscles were dissected off bluntly.  A similar process was carried out on the inferior aspect of the fascial incision. The rectus muscles were separated in the midline bluntly and the peritoneum was entered bluntly. Attention was turned to the lower uterine segment where a low transverse hysterotomy was made with a scalpel and extended bilaterally bluntly.  The infant was successfully delivered, the cord was clamped and cut after one minute, and the infant was handed over to the awaiting neonatology team. Uterine massage was then administered, and the placenta delivered intact with a three-vessel cord. The uterus was then cleared of clots and debris.  The hysterotomy was closed with 0 Vicryl in a running locked fashion, and an imbricating layer was also placed with 0 Vicryl. The pelvis was cleared of all clot and debris. Hemostasis was confirmed on all surfaces.  The peritoneum was closed with a 0 Vicryl running stitch. The fascia was then closed using 0 Vicryl in a running fashion.  The subcutaneous layer was irrigated.  The skin was closed with a 4-0 Vicryl subcuticular stitch.   The patient tolerated the procedure well. Sponge, lap, instrument and needle counts were correct x 3.  She was taken to the recovery room  in stable condition.   An experienced assistant was required given the standard of surgical care given the complexity of the case.  This assistant was  needed for exposure, dissection, suctioning, retraction, instrument exchange, assisting with delivery with administration of fundal pressure, and for overall help during the procedure.   Maternal Disposition: PACU - hemodynamically stable.   Infant Disposition: stable   Marcy Siren, D.O. OB Fellow  02/24/2020, 3:35 PM

## 2020-02-24 NOTE — Lactation Note (Addendum)
This note was copied from a baby's chart. Lactation Consultation Note  Patient Name: Michelle Vincent [id number] Date: 02/24/2020 Reason for consult: Initial assessment;1st time breastfeeding;Early term 37-38.6wks P1, 9 hour ETI female infant. Per mom, she has DEBP at home. Mom's hx: insomnia, anxiety, anemia and C/S delivery on the following medications: Ambien-L3 compatible , Lexapro-L2 safe and Propanolol-L2 safe with breastfeeding. Tools given: hand pump and breast shells to help mom evert nipple shaft out more due to having flat nipples. Mom was given DEBP for breast stimulation and help establish milk supply, infant has not latched at breast at this time. Mom did not breastfed infant in L&D due to vomiting mom made one attempt and has been using formula. LC notice used bottle or formula sitting on the shelf, LC discussed with parents that RTF must be discarded after one hour and not offer to infant again, per mom, that is not what she was told. LC reassured parents that our infant feeding policy is  for any RTF  formula left out at room tempeture must be discarded after one hour. LC discussed hand expression and mom taught back hand expression and expressed 2 mls of colostrum that she will offer infant after latching infant at breast for next feeding. LC unable to assist with latch at this time due dad giving infant 20 mls of formula prior to [facility] entering the room, infant asleep in basinet. Mom understands to wear breast shells in bra during the day and pre-pump breast with hand pump prior to latching infant at breast. Mom knows to breastfed infant on demand according hunger cues and not exceed 3 hours without breastfeeding infant. Mom knows to call RN or LC if she has any further questions, concerns or need assistance with latching infant at breast.  Mom will use DEBP every 3 hours for 15 minutes on initial setting. Mom shown how to use DEBP & how to disassemble, clean, & reassemble  parts. Mom made aware of O/P services, breastfeeding support groups, community resources, and our phone # for post-discharge questions.   Maternal Data Formula Feeding for Exclusion: No Has patient been taught Hand Expression?: Yes Does the patient have breastfeeding experience prior to this delivery?: No  Feeding Feeding Type: Bottle Fed - Formula Nipple Type: Other  LATCH Score                   Interventions Interventions: Breast feeding basics reviewed;Shells;Hand express;Expressed milk;Pre-pump if needed;Hand pump;DEBP  Lactation Tools Discussed/Used Tools: Shells;Pump Shell Type: Other (comment)(flat) Breast pump type: Double-Electric Breast Pump;Manual WIC Program: No Pump Review: Setup, frequency, and cleaning;Milk Storage Initiated by:: Danelle Earthly, IBCLC Date initiated:: 02/24/20   Consult Status Consult Status: Follow-up Date: 02/25/20 Follow-up type: In-patient    Danelle Earthly 02/24/2020, 11:29 PM

## 2020-02-24 NOTE — Progress Notes (Signed)
Patient ID: Lillie Portner, female   DOB: 03-Apr-1991, 29 y.o.   MRN: [id number]  IOL for hyperemesis  Day #4. S/p multiple doses of cytotec, foley balloon, pit yesterday with long pitocin break. Restarted pitocin at 1830 with AROM and IUPC at 2300 last night. Meconium.  Currently, patient frustrated with labor course/induction process. Vomiting a lot and in need of multiple antiemetics.  BP 125/73   Pulse 97   Temp 99 F (37.2 C) (Oral)   Resp 18   Ht 5\' 7"  (1.702 m)   Wt 91.6 kg   LMP 06/01/2019   SpO2 95%   BMI 31.64 kg/m   Dilation: 4.5 Effacement (%): 80 Cervical Position: Posterior Station: -1 Presentation: Vertex Exam by:: Dr. [email or web address]  Just reached 200 [place] units Cat 1 tracing.  Continue pitocin - assure adequacy. If no change in 4-6 hours, then may need to proceed with cesarean delivery.  Adrian Blackwater, DO

## 2020-02-24 NOTE — Anesthesia Postprocedure Evaluation (Signed)
Anesthesia Post Note  Patient: Michelle Vincent  Procedure(s) Performed: CESAREAN SECTION (N/A Abdomen)     Patient location during evaluation: Mother Baby Anesthesia Type: Epidural Level of consciousness: awake and alert Pain management: pain level controlled Vital Signs Assessment: post-procedure vital signs reviewed and stable Respiratory status: spontaneous breathing, nonlabored ventilation and respiratory function stable Cardiovascular status: stable Postop Assessment: no headache, no backache and epidural receding Anesthetic complications: no    Last Vitals:  Vitals:   02/24/20 1515 02/24/20 1532  BP: 118/71 119/73  Pulse: 71 72  Resp: 13 16  Temp:  37 C  SpO2: 92% 97%    Last Pain:  Vitals:   02/24/20 1515  TempSrc:   PainSc: 5    Pain Goal: Patients Stated Pain Goal: 4 (02/24/20 1515)  LLE Motor Response: Purposeful movement (02/24/20 1515) LLE Sensation: Numbness, Tingling (02/24/20 1515) RLE Motor Response: Purposeful movement (02/24/20 1515) RLE Sensation: Numbness, Tingling (02/24/20 1515)     Epidural/Spinal Function Cutaneous sensation: Tingles (02/24/20 1515), Patient able to flex knees: Yes (02/24/20 1515), Patient able to lift hips off bed: No (02/24/20 1515), Back pain beyond tenderness at insertion site: No (02/24/20 1515), Progressively worsening motor and/or sensory loss: No (02/24/20 1515), Bowel and/or bladder incontinence post epidural: No (02/24/20 1515)  Shaquinta Peruski

## 2020-02-24 NOTE — Discharge Summary (Addendum)
Postpartum Discharge Summary     Patient Name: Michelle Vincent DOB: 08-20-1991 MRN: [id number]  Date of admission: 02/20/2020 Delivering Provider: Truett Mainland   Date of discharge: 02/26/2020  Admitting diagnosis: Encounter for induction of labor [Z34.90] Intrauterine pregnancy: [redacted]w[redacted]d    Secondary diagnosis:  Active Problems:   Supervision of normal first pregnancy, antepartum   Inappropriate sinus tachycardia   Chronic migraine without aura without status migrainosus, not intractable   Mild intermittent asthma without complication   Hyperemesis gravidarum   Anxiety   Status post primary low transverse cesarean section   Arrest of dilation, delivered, current hospitalization  Additional problems: Anemia     Discharge diagnosis: Term Pregnancy Delivered                                                                                                Post partum procedures:blood transfusion  Augmentation: AROM, Pitocin, Cytotec and Foley Balloon  Complications: None  Hospital course:  Induction of Labor With Cesarean Section  29y.o. yo G1P0000 at 337w2das admitted to the hospital 02/20/2020 for induction of labor. Patient had a labor course significant for hyperemesis gravidarum. The patient went for cesarean section due to Arrest of Dilation and Non-Reassuring FHR, and delivered a Viable infant,02/24/2020  Membrane Rupture Time/Date: 10:52 PM ,02/23/2020   Details of operation can be found in separate operative Note. Patient had a low Hgb of 6.3 on POD#1, given 2u RBC and her Hgb increased to 9.5. On POD#2, she had lightheadedness in the AM that was resolved by midday. At discharge, she is ambulating, tolerating a regular diet, passing flatus, and urinating well.  Patient is discharged home in stable condition on 02/26/20.                          Delivery time: 1:30 PM    Magnesium Sulfate received: No BMZ received: No Rhophylac:No MMR:No Transfusion:Yes, 2 pRBC on  POD#1  Physical exam  Vitals:   02/25/20 1915 02/25/20 2005 02/26/20 0602 02/26/20 1436  BP: 114/70 118/71 134/83 124/76  Pulse: 82 75 62 70  Resp: '18 18 20 19  '$ Temp: 97.9 F (36.6 C) 98.1 F (36.7 C) 97.6 F (36.4 C) 98 F (36.7 C)  TempSrc: Oral Oral Oral Oral  SpO2: 100% 100% 100%   Weight:      Height:       Exam by KiDerrill MemoCNM. General: alert Lochia: appropriate Uterine Fundus: firm Incision: Healing well with no significant drainage DVT Evaluation: No evidence of DVT seen on physical exam. Labs: Lab Results  Component Value Date   WBC 14.4 (H) 02/26/2020   HGB 8.2 (L) 02/26/2020   HCT 26.1 (L) 02/26/2020   MCV 76.5 (L) 02/26/2020   PLT 260 02/26/2020   CMP Latest Ref Rng & Units 02/25/2020  Glucose 70 - 99 mg/dL -  BUN 6 - 20 mg/dL -  Creatinine 0.44 - 1.00 mg/dL 0.78  Sodium 135 - 145 mmol/L -  Potassium 3.5 - 5.1 mmol/L -  Chloride 98 - 111 mmol/L -  CO2 22 - 32 mmol/L -  Calcium 8.9 - 10.3 mg/dL -  Total Protein 6.5 - 8.1 g/dL -  Total Bilirubin 0.3 - 1.2 mg/dL -  Alkaline Phos 38 - 126 U/L -  AST 15 - 41 U/L -  ALT 0 - 44 U/L -   Edinburgh Score: Edinburgh Postnatal Depression Scale Screening Tool 02/25/2020  I have been able to laugh and see the funny side of things. (No Data)    Discharge instruction: per After Visit Summary and "Baby and Me Booklet".  After visit meds:  Allergies as of 02/24/2020      Reactions   Nitrous Oxide Anaphylaxis   Peach Flavor Anaphylaxis   Shellfish Allergy Anaphylaxis   Adhesive [tape] Hives, Itching, Other (See Comments)   blisters   Morphine And Related Hives, Itching           Diet: blood transfusion  Activity: Advance as tolerated. Pelvic rest for 6 weeks.   Outpatient follow up:4 weeks Follow up Appt: Future Appointments  Date Time Provider [place]  03/11/2020  2:15 PM Lavonia Drafts, MD [facility] None  03/26/2020 10:45 AM Nehemiah Settle Tanna Savoy, DO [facility] None   Follow up  Visit:    Please schedule this patient for Postpartum visit in: 4 weeks with the following provider: Any provider In-Person For C/S patients schedule nurse incision check in weeks 2 weeks: yes High risk pregnancy complicated by: sinus tachycardia, hyperemesis gravidarum, anxiety, mild intermittent asthma Delivery mode:  CS Anticipated Birth Control:  other/unsure PP Procedures needed: None  Schedule Integrated BH visit: no     Newborn Data: Live born female  Birth Weight:  3360g APGAR: 75, 9  Newborn Delivery   Birth date/time: 02/24/2020 13:30:00 Delivery type: C-Section, Low Transverse Trial of labor: Yes C-section categorization: Primary      Baby Feeding: Breast Disposition:home with mother   02/26/20 Loraine Leriche, DO, PGY1  I saw and evaluated the patient. I agree with the findings and the plan of care as documented in the resident's note.  Barrington Ellison, MD [facility] Family Medicine Fellow, [facility] for [employer], [place]

## 2020-02-24 NOTE — Transfer of Care (Signed)
Immediate Anesthesia Transfer of Care Note  Patient: Michelle Vincent  Procedure(s) Performed: CESAREAN SECTION (N/A Abdomen)  Patient Location: PACU  Anesthesia Type:Epidural  Level of Consciousness: awake, alert , oriented and patient cooperative  Airway & Oxygen Therapy: Patient Spontanous Breathing  Post-op Assessment: Report given to RN and Post -op Vital signs reviewed and stable  Post vital signs: Reviewed and stable  Last Vitals:  Vitals Value Taken Time  BP    Temp    Pulse    Resp    SpO2      Last Pain:  Vitals:   02/24/20 1202  TempSrc: Oral  PainSc: 1          Complications: No apparent anesthesia complications

## 2020-02-25 ENCOUNTER — Encounter (HOSPITAL_COMMUNITY): Payer: 59

## 2020-02-25 LAB — CBC
HCT: 20.8 % — ABNORMAL LOW (ref 36.0–46.0)
HCT: 29.3 % — ABNORMAL LOW (ref 36.0–46.0)
Hemoglobin: 6.3 g/dL — CL (ref 12.0–15.0)
Hemoglobin: 9.6 g/dL — ABNORMAL LOW (ref 12.0–15.0)
MCH: 23.2 pg — ABNORMAL LOW (ref 26.0–34.0)
MCH: 25.1 pg — ABNORMAL LOW (ref 26.0–34.0)
MCHC: 30.3 g/dL (ref 30.0–36.0)
MCHC: 32.8 g/dL (ref 30.0–36.0)
MCV: 76.5 fL — ABNORMAL LOW (ref 80.0–100.0)
MCV: 76.5 fL — ABNORMAL LOW (ref 80.0–100.0)
Platelets: 292 10*3/uL (ref 150–400)
Platelets: 328 10*3/uL (ref 150–400)
RBC: 2.72 MIL/uL — ABNORMAL LOW (ref 3.87–5.11)
RBC: 3.83 MIL/uL — ABNORMAL LOW (ref 3.87–5.11)
RDW: 14.6 % (ref 11.5–15.5)
RDW: 15 % (ref 11.5–15.5)
WBC: 20 10*3/uL — ABNORMAL HIGH (ref 4.0–10.5)
WBC: 19.3 10*3/uL — ABNORMAL HIGH (ref 4.0–10.5)
nRBC: 0 % (ref 0.0–0.2)
nRBC: 0.1 % (ref 0.0–0.2)

## 2020-02-25 LAB — PREPARE RBC (CROSSMATCH)

## 2020-02-25 LAB — CREATININE, SERUM
Creatinine, Ser: 0.78 mg/dL (ref 0.44–1.00)
GFR calc Af Amer: >60 mL/min (ref >60)
GFR calc non Af Amer: >60 mL/min (ref >60)

## 2020-02-25 MED ORDER — SODIUM CHLORIDE 0.9% IV SOLUTION: Freq: Once | INTRAVENOUS | Status: AC

## 2020-02-25 NOTE — Lactation Note (Signed)
This note was copied from a baby's chart. Lactation Consultation Note  Patient Name: Michelle Vincent Date: 02/25/2020 Reason for consult: Follow-up assessment   LC student entered room for follow-up assessment. Upon entering, both parents present, along with grandmother. MOB awake, grandmother holding infant, infant napping not showing significant hunger cues.   LC student reiterated breastfeeding basics. Grandmother requested to [facility] student to perform oral assessment because other grandchild did have an oral restriction. LC student performed oral assessment, no oral restriction found, both lip and tongue anatomy look good. Lips flange well and tongue extends past gum line. Suck assessment performed, with good sucking patterns and tongue movement noted. MOB states that she would like to take a shower, then [facility] student to assist when infant is showing significant hunger cues. MOB states that she would like to breastfeed if she can.   LC student encouraged MOB to call out for further assistance at next feed. [facility] student reiterated the importance of feeding infant at least 8 times in a 24 hour period. Lactation to follow-up.    Maternal Data Formula Feeding for Exclusion: No  Feeding Feeding Type: Bottle Fed - Formula  LATCH Score                   Interventions Interventions: Breast feeding basics reviewed  Lactation Tools Discussed/Used     Consult Status Consult Status: Follow-up Date: 02/25/20 Follow-up type: In-patient    Dayle Sherpa D Emmilee Reamer 02/25/2020, 1:32 PM

## 2020-02-25 NOTE — Progress Notes (Signed)
Pt requested OB be called for continuation of home medications at hospital (wants an order for Pepcid, Propanolol, and Lexapro at home dosages). Pt states she has her bag with home meds, but RN Treniyah Lynn assured pt that she will call physician and acquire medications. RN urged pt to not take any home medications and to call if anything was needed. Pt states understanding.   Dr. Crissie Reese called and meds ordered and given to pt. At this time, pt has no questions/concerns and requests no other medications.  Elvia Collum, RN

## 2020-02-25 NOTE — Progress Notes (Signed)
FOB called RN Dalynn Jhaveri to room for assistance with feeding. After preparing bottle MOB noted to be very drowsy, unable to keep eyes open, and talking somewhat erratically. After inquiry MOB reported that her itching became "annoying" and she took 50 mg of home Benadryl. RN remained in room throughout feeding and assisted pt back to bed safely. Newborn burped, changed, swaddled and placed back in bassinet. Railing placed up on left side and bedside table placed on right with call bell and necessary items at bedside within reach. Support person encouraged to call RN for assistance and pt urged not to try to get out of bed without RN present.   Elvia Collum, RN

## 2020-02-25 NOTE — Progress Notes (Signed)
Subjective: Postpartum Day 1: Cesarean Delivery Patient reports incisional pain, tolerating PO and no problems voiding.    Objective: Vital signs in last 24 hours: Temp:  [97.6 F (36.4 C)-99.1 F (37.3 C)] 98 F (36.7 C) (04/21 0437) Pulse Rate:  [67-97] 67 (04/21 0437) Resp:  [13-24] 18 (04/21 0437) BP: (101-137)/(53-79) 118/74 (04/21 0437) SpO2:  [92 %-99 %] 99 % (04/21 0437)  Physical Exam:  General: alert, cooperative and no distress Lochia: appropriate Uterine Fundus: firm Incision: healing well, no significant drainage, no dehiscence, no significant erythema DVT Evaluation: No evidence of DVT seen on physical exam.  Recent Labs    02/22/20 0908 02/25/20 0602  HGB 9.1* 6.3*  HCT 30.2* 20.8*    Assessment/Plan: Status post Cesarean section. Doing well postoperatively.  Continue current care. Hg 6.3. Transfuse 2 units.  Levie Heritage 02/25/2020, 7:52 AM

## 2020-02-25 NOTE — Progress Notes (Signed)
MOB was referred for history of anxiety. * Referral screened out by Clinical Social Worker because none of the following criteria appear to apply: ~ History of anxiety/depression during this pregnancy, or of post-partum depression following prior delivery. ~ Diagnosis of anxiety and/or depression within last 3 years OR * MOB's symptoms currently being treated with medication and/or therapy. Per chart review, MOB is currently taking/prescribed Lexapro.   Please contact the Clinical Social Worker if needs arise, by MOB request, or if MOB scores greater than 9/yes to question 10 on Edinburgh Postpartum Depression Screen.  Caitlin Ainley, LCSW Clinical Social Worker [facility] Cell#: [phone] 

## 2020-02-26 ENCOUNTER — Encounter: Payer: 59 | Admitting: Family Medicine

## 2020-02-26 ENCOUNTER — Other Ambulatory Visit: Payer: Self-pay | Admitting: Family Medicine

## 2020-02-26 DIAGNOSIS — Z98891 History of uterine scar from previous surgery: Secondary | ICD-10-CM

## 2020-02-26 LAB — TYPE AND SCREEN
ABO/RH(D): A POS
Antibody Screen: NEGATIVE
Unit division: 0
Unit division: 0

## 2020-02-26 LAB — BPAM RBC
Blood Product Expiration Date: 202105122359
Blood Product Expiration Date: 202105122359
ISSUE DATE / TIME: 202104211112
ISSUE DATE / TIME: 202104211656
Unit Type and Rh: 6200
Unit Type and Rh: 6200

## 2020-02-26 LAB — CBC
HCT: 26.1 % — ABNORMAL LOW (ref 36.0–46.0)
Hemoglobin: 8.2 g/dL — ABNORMAL LOW (ref 12.0–15.0)
MCH: 24 pg — ABNORMAL LOW (ref 26.0–34.0)
MCHC: 31.4 g/dL (ref 30.0–36.0)
MCV: 76.5 fL — ABNORMAL LOW (ref 80.0–100.0)
Platelets: 260 K/uL (ref 150–400)
RBC: 3.41 MIL/uL — ABNORMAL LOW (ref 3.87–5.11)
RDW: 14.9 % (ref 11.5–15.5)
WBC: 14.4 K/uL — ABNORMAL HIGH (ref 4.0–10.5)
nRBC: 0 % (ref 0.0–0.2)

## 2020-02-26 MED ORDER — OXYCODONE HCL 5 MG PO TABS: 5.0000 mg | ORAL_TABLET | ORAL | 0 refills | Status: DC | PRN

## 2020-02-26 MED ORDER — PROPRANOLOL HCL 10 MG PO TABS: 20.0000 mg | ORAL_TABLET | Freq: Two times a day (BID) | ORAL | 3 refills | Status: DC

## 2020-02-26 MED ORDER — GABAPENTIN 100 MG PO CAPS: 100.0000 mg | ORAL_CAPSULE | Freq: Two times a day (BID) | ORAL | 0 refills | Status: AC

## 2020-02-26 MED ORDER — GABAPENTIN 100 MG PO CAPS: 100.0000 mg | ORAL_CAPSULE | Freq: Two times a day (BID) | ORAL | 0 refills | Status: DC

## 2020-02-26 NOTE — Discharge Instructions (Signed)
Cesarean Delivery, Care After This sheet gives you information about how to care for yourself after your procedure. Your health care provider may also give you more specific instructions. If you have problems or questions, contact your health care provider. What can I expect after the procedure? After the procedure, it is common to have:  A small amount of blood or clear fluid coming from the incision.  Some redness, swelling, and pain in your incision area.  Some abdominal pain and soreness.  Vaginal bleeding (lochia). Even though you did not have a vaginal delivery, you will still have vaginal bleeding and discharge.  Pelvic cramps.  Fatigue. You may have pain, swelling, and discomfort in the tissue between your vagina and your anus (perineum) if:  Your C-section was unplanned, and you were allowed to labor and push.  An incision was made in the area (episiotomy) or the tissue tore during attempted vaginal delivery. Follow these instructions at home: Incision care   Follow instructions from your health care provider about how to take care of your incision. Make sure you: ? Wash your hands with soap and water before you change your bandage (dressing). If soap and water are not available, use hand sanitizer. ? If you have a dressing, change it or remove it as told by your health care provider. ? Leave stitches (sutures), skin staples, skin glue, or adhesive strips in place. These skin closures may need to stay in place for 2 weeks or longer. If adhesive strip edges start to loosen and curl up, you may trim the loose edges. Do not remove adhesive strips completely unless your health care provider tells you to do that.  Check your incision area every day for signs of infection. Check for: ? More redness, swelling, or pain. ? More fluid or blood. ? Warmth. ? Pus or a bad smell.  Do not take baths, swim, or use a hot tub until your health care provider says it's okay. Ask your health  care provider if you can take showers.  When you cough or sneeze, hug a pillow. This helps with pain and decreases the chance of your incision opening up (dehiscing). Do this until your incision heals. Medicines  Take over-the-counter and prescription medicines only as told by your health care provider.  If you were prescribed an antibiotic medicine, take it as told by your health care provider. Do not stop taking the antibiotic even if you start to feel better.  Do not drive or use heavy machinery while taking prescription pain medicine. Lifestyle  Do not drink alcohol. This is especially important if you are breastfeeding or taking pain medicine.  Do not use any products that contain nicotine or tobacco, such as cigarettes, e-cigarettes, and chewing tobacco. If you need help quitting, ask your health care provider. Eating and drinking  Drink at least 8 eight-ounce glasses of water every day unless told not to by your health care provider. If you breastfeed, you may need to drink even more water.  Eat high-fiber foods every day. These foods may help prevent or relieve constipation. High-fiber foods include: ? Whole grain cereals and breads. ? Brown rice. ? Beans. ? Fresh fruits and vegetables. Activity   If possible, have someone help you care for your baby and help with household activities for at least a few days after you leave the hospital.  Return to your normal activities as told by your health care provider. Ask your health care provider what activities are safe for   you.  Rest as much as possible. Try to rest or take a nap while your baby is sleeping.  Do not lift anything that is heavier than 10 lbs (4.5 kg), or the limit that you were told, until your health care provider says that it is safe.  Talk with your health care provider about when you can engage in sexual activity. This may depend on your: ? Risk of infection. ? How fast you heal. ? Comfort and desire to  engage in sexual activity. General instructions  Do not use tampons or douches until your health care provider approves.  Wear loose, comfortable clothing and a supportive and well-fitting bra.  Keep your perineum clean and dry. Wipe from front to back when you use the toilet.  If you pass a blood clot, save it and call your health care provider to discuss. Do not flush blood clots down the toilet before you get instructions from your health care provider.  Keep all follow-up visits for you and your baby as told by your health care provider. This is important. Contact a health care provider if:  You have: ? A fever. ? Bad-smelling vaginal discharge. ? Pus or a bad smell coming from your incision. ? Difficulty or pain when urinating. ? A sudden increase or decrease in the frequency of your bowel movements. ? More redness, swelling, or pain around your incision. ? More fluid or blood coming from your incision. ? A rash. ? Nausea. ? Little or no interest in activities you used to enjoy. ? Questions about caring for yourself or your baby.  Your incision feels warm to the touch.  Your breasts turn red or become painful or hard.  You feel unusually sad or worried.  You vomit.  You pass a blood clot from your vagina.  You urinate more than usual.  You are dizzy or light-headed. Get help right away if:  You have: ? Pain that does not go away or get better with medicine. ? Chest pain. ? Difficulty breathing. ? Blurred vision or spots in your vision. ? Thoughts about hurting yourself or your baby. ? New pain in your abdomen or in one of your legs. ? A severe headache.  You faint.  You bleed from your vagina so much that you fill more than one sanitary pad in one hour. Bleeding should not be heavier than your heaviest period. Summary  After the procedure, it is common to have pain at your incision site, abdominal cramping, and slight bleeding from your vagina.  Check  your incision area every day for signs of infection.  Tell your health care provider about any unusual symptoms.  Keep all follow-up visits for you and your baby as told by your health care provider. This information is not intended to replace advice given to you by your health care provider. Make sure you discuss any questions you have with your health care provider. Document Revised: 05/01/2018 Document Reviewed: 05/01/2018 Elsevier Patient Education  2020 Elsevier Inc.  

## 2020-02-26 NOTE — Progress Notes (Addendum)
Patient ID: Michelle Vincent, female   DOB: [birth year], 29 y.o.   MRN: [id number] Post Operative Day 2 Subjective: no complaints, up ad lib, voiding, tolerating PO and + flatus , stooling Reports feeling better after blood transfusion yesterday  Objective: Blood pressure 134/83, pulse 62, temperature 97.6 F (36.4 C), temperature source Oral, resp. rate 20, height 5\' 7"  (1.702 m), weight 91.6 kg, last menstrual period 06/01/2019, SpO2 100 %, unknown if currently breastfeeding.  Physical Exam:  General: alert, appears stated age and no distress Lochia: appropriate Uterine Fundus: firm Incision: no significant drainage, no significant erythema DVT Evaluation: No cords or calf tenderness. Calf/Ankle edema is present bilaterally, improved from yesterday.   Recent Labs    02/25/20 0602 02/25/20 2122  HGB 6.3* 9.6*  HCT 20.8* 29.3*    Assessment/Plan: Discharge home, Breastfeeding, Circumcision prior to discharge and Contraception plan for Nuvaring at outpatient visit   Vincent: 6 days   2123 02/26/2020, 7:38 AM   I examined the patient with the medical student and agree with the above 02/28/2020, DO, PGY1

## 2020-02-27 ENCOUNTER — Encounter (HOSPITAL_COMMUNITY): Payer: 59

## 2020-03-11 ENCOUNTER — Ambulatory Visit (INDEPENDENT_AMBULATORY_CARE_PROVIDER_SITE_OTHER): Payer: 59 | Admitting: Obstetrics & Gynecology

## 2020-03-11 ENCOUNTER — Other Ambulatory Visit: Payer: Self-pay

## 2020-03-11 ENCOUNTER — Encounter: Payer: Self-pay | Admitting: Obstetrics & Gynecology

## 2020-03-11 NOTE — Progress Notes (Signed)
History:  29 y.o.LMP here today for 2 week post op check.Pt is s/p c-section on 02/24/2020.  Pt reports that she is doing well. She is eating and passing stols without difficulty.   Adequate pain control. She is nursing and bottle feeding.   The following portions of the patient's history were reviewed and updated as appropriate: allergies, current medications, past family history, past medical history, past social history, past surgical history and problem list.  Review of Systems:  Pertinent items are noted in HPI.    Objective:  Physical Exam BP 108/73   Pulse 91   Ht 5\' 7"  (1.702 m)   Wt 175 lb (79.4 kg)   LMP 06/01/2019   BMI 27.41 kg/m   CONSTITUTIONAL: Well-developed, well-nourished female in no acute distress.  HENT:  Normocephalic, atraumatic EYES: Conjunctivae and EOM are normal. No scleral icterus.  NECK: Normal range of motion SKIN: Skin is warm and dry. No rash noted. Not diaphoretic.No pallor. NEUROLGIC: Alert and oriented to person, place, and time. Normal coordination.  Abd: Soft, nontender and nondistended; steristrips removed. Her incision is healing well.  It is clean, dry and intact.    Assessment & Plan:  2 week post op check following c--section  Doing well  Reviewed post op instructions and activities  Gradual increase in activities  F/u in 4 weeks or sooner prn  Reviewed no intercourse for 6 weeks post op  All questions answered.   Gurnoor Sloop L. Harraway-Smith, M.D., 06/03/2019

## 2020-03-26 ENCOUNTER — Other Ambulatory Visit: Payer: Self-pay

## 2020-03-26 ENCOUNTER — Ambulatory Visit (INDEPENDENT_AMBULATORY_CARE_PROVIDER_SITE_OTHER): Payer: 59 | Admitting: Family Medicine

## 2020-03-26 ENCOUNTER — Encounter: Payer: Self-pay | Admitting: Family Medicine

## 2020-03-26 DIAGNOSIS — Z1389 Encounter for screening for other disorder: Secondary | ICD-10-CM

## 2020-03-26 MED ORDER — ETONOGESTREL-ETHINYL ESTRADIOL 0.12-0.015 MG/24HR VA RING: VAGINAL_RING | VAGINAL | 3 refills | Status: DC

## 2020-03-26 NOTE — Progress Notes (Signed)
    Post Partum Visit Note  Michelle Vincent is a 29 y.o. G69P1001 female who presents for a postpartum visit. She is 4 weeks postpartum following a primary cesarean section.  I have fully reviewed the prenatal and intrapartum course. The delivery was at 38.2 gestational weeks.  Anesthesia: epidural. Postpartum course has been uneventful- patient resumed her depression medications. Baby is doing well. Baby is feeding by bottle - [employer] Good Start. Bleeding moderate lochia. Bowel function is normal. Bladder function is normal. Patient is not sexually active. Contraception method is NuvaRing vaginal inserts. Postpartum depression screening:negative-score2  Review of Systems Pertinent items are noted in HPI.    Objective:  Last pap 11-29-2018 Bottle feeding  General:  alert, cooperative and no distress  Lungs: clear to auscultation bilaterally  Heart:  regular rate and rhythm, S1, S2 normal, no murmur, click, rub or gallop  Abdomen: soft, non-tender; bowel sounds normal; no masses,  no organomegaly. Incision clean, dry, intact.        Assessment:    Normal postpartum exam. Pap smear not done at today's visit. Done on 11-29-2018 WNL.  Plan:   Essential components of care per ACOG recommendations:  1.  Mood and well being: Patient with negative depression screening today. Reviewed local resources for support.  - Patient does not use tobacco.  - hx of drug use? No    2. Infant care and feeding:  -Patient currently breastmilk feeding? No If-Social determinants of health (SDOH) reviewed in EPIC. No concerns  3. Sexuality, contraception and birth spacing - Patient does not want a pregnancy in the next year.   - Reviewed forms of contraception in tiered fashion. Patient desired NuvaRing vaginal inserts today.   - Discussed birth spacing of 18 months  4. Sleep and fatigue -Encouraged family/partner/community support of 4 hrs of uninterrupted sleep to help with mood and fatigue  5.  Physical Recovery  - Discussed patients delivery and complications - Patient has urinary incontinence? No - Patient is safe to resume physical and sexual activity  6.  Health Maintenance - Last pap smear done 01-24-2020and was normal with negative HPV.   Levie Heritage, DO Center for [employer], [facility]

## 2020-04-08 NOTE — Telephone Encounter (Signed)
Called patient. Bleeding slowing. Will start nuvaring

## 2020-04-17 ENCOUNTER — Other Ambulatory Visit: Payer: Self-pay | Admitting: Family Medicine

## 2020-04-22 ENCOUNTER — Other Ambulatory Visit: Payer: Self-pay | Admitting: Family Medicine

## 2020-04-22 MED ORDER — PROPRANOLOL HCL 10 MG PO TABS: 20.0000 mg | ORAL_TABLET | Freq: Two times a day (BID) | ORAL | 3 refills | Status: AC

## 2020-04-22 MED ORDER — PROPRANOLOL HCL 10 MG PO TABS: 20.0000 mg | ORAL_TABLET | Freq: Two times a day (BID) | ORAL | 3 refills | Status: DC

## 2020-04-22 NOTE — Addendum Note (Signed)
Addended by: Levie Heritage on: 04/22/2020 12:04 PM   Modules accepted: Orders

## 2020-05-06 ENCOUNTER — Encounter: Payer: Self-pay | Admitting: Family Medicine

## 2020-05-06 ENCOUNTER — Other Ambulatory Visit: Payer: Self-pay

## 2020-05-06 ENCOUNTER — Ambulatory Visit (INDEPENDENT_AMBULATORY_CARE_PROVIDER_SITE_OTHER): Payer: 59 | Admitting: Family Medicine

## 2020-05-06 VITALS — BP 108/65 | HR 74 | Ht 67.0 in | Wt 171.0 lb

## 2020-05-06 DIAGNOSIS — Z975 Presence of (intrauterine) contraceptive device: Secondary | ICD-10-CM | POA: Diagnosis not present

## 2020-05-06 DIAGNOSIS — R102 Pelvic and perineal pain: Secondary | ICD-10-CM

## 2020-05-06 DIAGNOSIS — N921 Excessive and frequent menstruation with irregular cycle: Secondary | ICD-10-CM | POA: Diagnosis not present

## 2020-05-06 NOTE — Progress Notes (Signed)
   Subjective:    Patient ID: Michelle Vincent, female    DOB: August 20, 1991, 29 y.o.   MRN: [id number]  HPI  Patient seen for left pelvic pain that started about 10 days ago and has been persistent and without radiation. No abnormal discharge. Has been using nuvaring for past 3 weeks. Pain started half way through. Also having heavy bleeding that started 2 weeks ago. She did go to [place] ED. Had CT that was normal. Pain improving some.  Review of Systems     Objective:   Physical Exam Vitals reviewed.  Constitutional:      Appearance: Normal appearance.  Cardiovascular:     Pulses: Normal pulses.     Heart sounds: Normal heart sounds.  Pulmonary:     Effort: Pulmonary effort is normal.     Breath sounds: Normal breath sounds.  Abdominal:     General: Abdomen is flat. There is no distension.     Palpations: Abdomen is soft. There is no mass.     Tenderness: There is abdominal tenderness (LLQ). There is no guarding or rebound.  Neurological:     General: No focal deficit present.     Mental Status: She is alert and oriented to person, place, and time.  Psychiatric:        Mood and Affect: Mood normal.        Behavior: Behavior normal.        Thought Content: Thought content normal.        Judgment: Judgment normal.       Assessment & Plan:  1. Pelvic pain Uncertain of etiology. Will attempt to correct bleeding - this may improve pain. If not, will check [place].  2. Breakthrough bleeding with NuvaRing Change nuvaring today. If no improvement in 3 days, add on oral progesterone.

## 2020-05-21 ENCOUNTER — Emergency Department (HOSPITAL_BASED_OUTPATIENT_CLINIC_OR_DEPARTMENT_OTHER): Payer: No Typology Code available for payment source

## 2020-05-21 ENCOUNTER — Other Ambulatory Visit: Payer: Self-pay

## 2020-05-21 ENCOUNTER — Emergency Department (HOSPITAL_BASED_OUTPATIENT_CLINIC_OR_DEPARTMENT_OTHER)
Admission: EM | Admit: 2020-05-21 | Discharge: 2020-05-21 | Disposition: A | Discharge status: Home / Self Care | Payer: No Typology Code available for payment source | Attending: Emergency Medicine | Admitting: Emergency Medicine

## 2020-05-21 ENCOUNTER — Encounter (HOSPITAL_BASED_OUTPATIENT_CLINIC_OR_DEPARTMENT_OTHER): Payer: Self-pay | Admitting: Emergency Medicine

## 2020-05-21 DIAGNOSIS — M24411 Recurrent dislocation, right shoulder: Secondary | ICD-10-CM | POA: Insufficient documentation

## 2020-05-21 DIAGNOSIS — J4520 Mild intermittent asthma, uncomplicated: Secondary | ICD-10-CM | POA: Insufficient documentation

## 2020-05-21 DIAGNOSIS — Z79899 Other long term (current) drug therapy: Secondary | ICD-10-CM | POA: Insufficient documentation

## 2020-05-21 DIAGNOSIS — M25511 Pain in right shoulder: Secondary | ICD-10-CM | POA: Diagnosis present

## 2020-05-21 DIAGNOSIS — R52 Pain, unspecified: Secondary | ICD-10-CM | POA: Diagnosis not present

## 2020-05-21 MED ORDER — FENTANYL CITRATE (PF) 100 MCG/2ML IJ SOLN
100.0000 ug | Freq: Once | INTRAMUSCULAR | Status: AC
  Administered 2020-05-21: 100 ug via INTRAVENOUS
  Filled 2020-05-21: qty 2

## 2020-05-21 MED ORDER — LIDOCAINE-EPINEPHRINE 1 %-1:100000 IJ SOLN
20.0000 mL | Freq: Once | INTRAMUSCULAR | Status: AC
  Administered 2020-05-21: 20 mL via INTRADERMAL
  Filled 2020-05-21: qty 1

## 2020-05-21 NOTE — Discharge Instructions (Signed)
Take 4 over the counter ibuprofen tablets 3 times a day or 2 over-the-counter naproxen tablets twice a day for pain. Also take tylenol 1000mg (2 extra strength) four times a day.   Follow up with ortho.  Return for worsening pain.

## 2020-05-21 NOTE — ED Notes (Signed)
Patient transported to X-ray 

## 2020-05-21 NOTE — ED Triage Notes (Signed)
Patient presents via EMS with complaints of right shoulder dislocation; states history of same.

## 2020-05-21 NOTE — ED Notes (Signed)
ED Provider at bedside. No shoulder reduction needed. Shoulder immobilizer applied.
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
## 2020-06-10 ENCOUNTER — Encounter (HOSPITAL_BASED_OUTPATIENT_CLINIC_OR_DEPARTMENT_OTHER): Payer: Self-pay

## 2020-06-10 ENCOUNTER — Other Ambulatory Visit: Payer: Self-pay

## 2020-06-10 ENCOUNTER — Emergency Department (HOSPITAL_BASED_OUTPATIENT_CLINIC_OR_DEPARTMENT_OTHER): Payer: 59

## 2020-06-10 ENCOUNTER — Emergency Department (HOSPITAL_BASED_OUTPATIENT_CLINIC_OR_DEPARTMENT_OTHER)
Admission: EM | Admit: 2020-06-10 | Discharge: 2020-06-10 | Disposition: A | Discharge status: Home / Self Care | Payer: 59 | Attending: Emergency Medicine | Admitting: Emergency Medicine

## 2020-06-10 DIAGNOSIS — Z7951 Long term (current) use of inhaled steroids: Secondary | ICD-10-CM | POA: Insufficient documentation

## 2020-06-10 DIAGNOSIS — J45909 Unspecified asthma, uncomplicated: Secondary | ICD-10-CM | POA: Insufficient documentation

## 2020-06-10 DIAGNOSIS — G43909 Migraine, unspecified, not intractable, without status migrainosus: Secondary | ICD-10-CM | POA: Insufficient documentation

## 2020-06-10 DIAGNOSIS — G43911 Migraine, unspecified, intractable, with status migrainosus: Secondary | ICD-10-CM

## 2020-06-10 DIAGNOSIS — R519 Headache, unspecified: Secondary | ICD-10-CM | POA: Diagnosis present

## 2020-06-10 MED ORDER — SODIUM CHLORIDE 0.9 % IV BOLUS
1000.0000 mL | Freq: Once | INTRAVENOUS | Status: AC
  Administered 2020-06-10: 1000 mL via INTRAVENOUS

## 2020-06-10 MED ORDER — METOCLOPRAMIDE HCL 5 MG/ML IJ SOLN
10.0000 mg | Freq: Once | INTRAMUSCULAR | Status: AC
  Administered 2020-06-10: 10 mg via INTRAVENOUS
  Filled 2020-06-10: qty 2

## 2020-06-10 MED ORDER — SODIUM CHLORIDE 0.9 % IV SOLN: INTRAVENOUS | Status: DC

## 2020-06-10 MED ORDER — DEXAMETHASONE SODIUM PHOSPHATE 10 MG/ML IJ SOLN
10.0000 mg | Freq: Once | INTRAMUSCULAR | Status: AC
  Administered 2020-06-10: 10 mg via INTRAVENOUS
  Filled 2020-06-10: qty 1

## 2020-06-10 MED ORDER — DIPHENHYDRAMINE HCL 50 MG/ML IJ SOLN
25.0000 mg | Freq: Once | INTRAMUSCULAR | Status: AC
  Administered 2020-06-10: 25 mg via INTRAVENOUS
  Filled 2020-06-10: qty 1
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
## 2020-06-10 NOTE — ED Notes (Signed)
Pt states she doesn't want fluid

## 2020-06-10 NOTE — Discharge Instructions (Signed)
Head CT negative.  Rest and take it easy certainly overnight in a dark room.  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  This should hopefully abate the headache completely.  Return for any new or worse symptoms.

## 2020-06-10 NOTE — ED Notes (Signed)
Pt is feeling better and is requesting to be d/c'd. States she does not want anymore of fluids ordered.

## 2020-06-10 NOTE — ED Notes (Signed)
Patient transported to CT 

## 2020-06-11 ENCOUNTER — Other Ambulatory Visit: Payer: Self-pay | Admitting: Family Medicine

## 2020-06-22 ENCOUNTER — Telehealth: Payer: Self-pay

## 2020-06-22 ENCOUNTER — Other Ambulatory Visit: Payer: Self-pay

## 2020-06-22 ENCOUNTER — Ambulatory Visit: Payer: 59 | Admitting: Cardiology

## 2020-06-22 ENCOUNTER — Encounter: Payer: Self-pay | Admitting: Cardiology

## 2020-06-22 VITALS — BP 122/70 | HR 81 | Ht 67.0 in | Wt 179.0 lb

## 2020-06-22 DIAGNOSIS — R079 Chest pain, unspecified: Secondary | ICD-10-CM | POA: Diagnosis not present

## 2020-06-22 NOTE — Progress Notes (Addendum)
Cardiology Office Note   Date:  06/22/2020   ID:  Sharen Heck, DOB 1990/12/04, MRN [id number]  PCP:  Hilarie Fredrickson, MD  Cardiologist:   Rollene Rotunda, MD   Chief Complaint  Patient presents with  . Palpitations      History of Present Illness: Michelle Vincent is a 29 y.o. female who presents for ongoing assessment and management of palpitations. She is now 3 months postpartum.  She has a little boy named Michelle Vincent.  She had hyperemesis gravidarum.  She did have tachypalpitations and for a short while was placed on labetalol but this dropped her blood pressure.  She was back on propranolol and she has been taking this since delivery.  If she skips it she sometimes has a bad day.  She occasionally has some tachypalpitations even if she takes a full dose of propranolol but these are sporadic.  She is not had any presyncope or syncope.  She cannot bring these on.  She is not having any  neck or arm discomfort.  She is not having any new shortness of breath, PND or orthopnea.  She said no weight gain or edema.  She does have some sporadic chest discomfort.  This is left upper chest.  It pressure.  7 out of 10 lasting for about 5 minutes.  She does not bring it on with activity.  It happens sporadically.  She gets a little bit short of breath.  She is not describing neck or arm discomfort.  There is no associated nausea vomiting or diaphoresis.  She has never had this pain before.  Of note she did injure her right arm falling out of an ambulance.  Past Medical History:  Diagnosis Date  . Anemia   . Anxiety    takes lexapro  . Asthma    prn inhaler use  . HA (headache)   . Migraine   . Occipital neuralgia   . Sinus tachycardia   . SVT (supraventricular tachycardia) (HCC)     Past Surgical History:  Procedure Laterality Date  . CESAREAN SECTION N/A 02/24/2020   Procedure: CESAREAN SECTION;  Surgeon: Levie Heritage, DO;  Location: [facility] ORS;  Service: Obstetrics;   Laterality: N/A;  . shoulder Right   . TONSILLECTOMY AND ADENOIDECTOMY    . tubes in ears       Prior to Admission medications   Medication Sig Start Date End Date Taking? Authorizing Provider  AIMOVIG 140 MG/ML SOAJ SMARTSIG:140 Milligram(s) SUB-Q 04/16/20   [provider]  albuterol (VENTOLIN HFA) 108 (90 Base) MCG/ACT inhaler Inhale 1 puff into the lungs every 6 (six) hours as needed for wheezing or shortness of breath.    [provider]  escitalopram (LEXAPRO) 10 MG tablet TAKE 1 TABLET BY MOUTH EVERY DAY Patient taking differently: Take 20 mg by mouth at bedtime.  01/21/20   Levie Heritage, DO  etonogestrel-ethinyl estradiol (NUVARING) 0.12-0.015 MG/24HR vaginal ring Insert vaginally and leave in place for 3 consecutive weeks, then remove for 1 week. 03/26/20   Levie Heritage, DO  ferrous sulfate 325 (65 FE) MG tablet Take 325 mg by mouth 3 (three) times daily. 12/04/14   [provider]  NURTEC 75 MG TBDP Take by mouth. 03/18/20   [provider]  ondansetron (ZOFRAN) 8 MG tablet Take 1 tablet by mouth every 8 (eight) hours as needed. 03/18/20   [provider]  orphenadrine (NORFLEX) 100 MG tablet Take 100 mg by mouth 2 (  two) times daily as needed. 04/15/20   [provider]  propranolol (INDERAL) 10 MG tablet Take 2 tablets (20 mg total) by mouth 2 (two) times daily. 04/22/20   Levie Heritage, DO  Ubrogepant 100 MG TABS Take by mouth. 05/19/20   [provider]  zolpidem (AMBIEN) 10 MG tablet Take 10 mg by mouth at bedtime as needed for sleep.    [provider]    Allergies:   Nitrous oxide, Peach flavor, Shellfish allergy, Adhesive [tape], and Morphine and related    ROS:  Please see the history of present illness.   Otherwise, review of systems are positive for none.   All other systems are reviewed and negative.    PHYSICAL EXAM: VS:  BP 122/70   Pulse 81   Ht 5\' 7"  (1.702 m)   Wt 179 lb (81.2 kg)    LMP 05/29/2020   SpO2 99%   BMI 28.04 kg/m  , BMI Body mass index is 28.04 kg/m. GENERAL:  Well appearing NECK:  No jugular venous distention, waveform within normal limits, carotid upstroke brisk and symmetric, no bruits, no thyromegaly LUNGS:  Clear to auscultation bilaterally CHEST:  Unremarkable HEART:  PMI not displaced or sustained,S1 and S2 within normal limits, no S3, no S4, no clicks, no rubs, no murmurs ABD:  Flat, positive bowel sounds normal in frequency in pitch, no bruits, no rebound, no guarding, no midline pulsatile mass, no hepatomegaly, no splenomegaly EXT:  2 plus pulses throughout, no edema, no cyanosis no clubbing   EKG:  EKG is ordered today. The ekg ordered today demonstrates sinus rhythm, rate 81, axis within normal limits, intervals within normal limits.  Non specific diffuse T wave flattening.  No change from previous.   Recent Labs: 09/15/2019: TSH 0.489 02/22/2020: ALT 11; BUN 6; Potassium 4.5; Sodium 138 02/25/2020: Creatinine, Ser 0.78 02/26/2020: Hemoglobin 8.2; Platelets 260    Lipid Panel No results found for: CHOL, TRIG, HDL, CHOLHDL, VLDL, LDLCALC, LDLDIRECT    Wt Readings from Last 3 Encounters:  06/22/20 179 lb (81.2 kg)  06/10/20 174 lb 6.4 oz (79.1 kg)  05/21/20 171 lb 1.2 oz (77.6 kg)      Other studies Reviewed: Additional studies/ records that were reviewed today include: None. Review of the above records demonstrates:  Please see elsewhere in the note.     ASSESSMENT AND PLAN:  TACHYCARDIA: Patient will continue the current dose of propranolol and could use some as needed dosing if she has particular tachypalpitations.  She had previous documented sinus tachycardia and I think can be treated symptomatically.  COVID VACCINE: She had a Covid vaccine.  CHEST PAIN: Patient's chest pain is atypical for cardiac source.  I would not suggest further cardiac work-up and will refer her back to her primary provider if this  continues.   Current medicines are reviewed at length with the patient today.  The patient does not have concerns regarding medicines.  The following changes have been made:  no change  Labs/ tests ordered today include: None  Orders Placed This Encounter  Procedures  . EKG 12-Lead     Disposition:   FU with me as needed.     Signed, 05/23/20, MD  06/22/2020 4:09 PM    [facility]

## 2020-06-22 NOTE — Patient Instructions (Signed)
Medication Instructions:  No changes *If you need a refill on your cardiac medications before your next appointment, please call your pharmacy*  Lab Work: None ordered this visit  Testing/Procedures: None ordered this visit  Follow-Up: At [facility], you and your health needs are our priority.  As part of our continuing mission to provide you with exceptional heart care, we have created designated Provider Care Teams.  These Care Teams include your primary Cardiologist (physician) and Advanced Practice Providers (APPs -  Physician Assistants and Nurse Practitioners) who all work together to provide you with the care you need, when you need it.  Your next appointment:   F/U as needed  Other Instructions If PCP will prescribe propranolol you may follow up with cardiology as needed.

## 2020-06-22 NOTE — Telephone Encounter (Signed)
Patient walked in office.She stated she has been having chest pain off and on for the past 2 days.Appointment scheduled with Dr.Hochrein this afternoon at 3:00 pm.

## 2020-08-02 ENCOUNTER — Ambulatory Visit: Payer: 59 | Admitting: Cardiology

## 2020-08-31 ENCOUNTER — Telehealth: Payer: Self-pay | Admitting: Cardiology

## 2020-08-31 NOTE — Telephone Encounter (Signed)
     I went in pt's chart to see when nhe needs his next appt

## 2020-09-07 DIAGNOSIS — Z91018 Allergy to other foods: Secondary | ICD-10-CM | POA: Diagnosis not present

## 2020-09-07 DIAGNOSIS — R110 Nausea: Secondary | ICD-10-CM | POA: Diagnosis not present

## 2020-09-07 DIAGNOSIS — G43909 Migraine, unspecified, not intractable, without status migrainosus: Secondary | ICD-10-CM | POA: Diagnosis not present

## 2020-09-07 DIAGNOSIS — Z91013 Allergy to seafood: Secondary | ICD-10-CM | POA: Diagnosis not present

## 2020-09-07 DIAGNOSIS — H53149 Visual discomfort, unspecified: Secondary | ICD-10-CM | POA: Diagnosis not present

## 2020-09-07 DIAGNOSIS — I951 Orthostatic hypotension: Secondary | ICD-10-CM | POA: Diagnosis not present

## 2020-09-07 DIAGNOSIS — R519 Headache, unspecified: Secondary | ICD-10-CM | POA: Diagnosis not present

## 2020-09-07 DIAGNOSIS — Z793 Long term (current) use of hormonal contraceptives: Secondary | ICD-10-CM | POA: Diagnosis not present

## 2020-09-07 DIAGNOSIS — J45909 Unspecified asthma, uncomplicated: Secondary | ICD-10-CM | POA: Diagnosis not present

## 2020-09-07 DIAGNOSIS — Z885 Allergy status to narcotic agent status: Secondary | ICD-10-CM | POA: Diagnosis not present

## 2020-09-07 DIAGNOSIS — Z91048 Other nonmedicinal substance allergy status: Secondary | ICD-10-CM | POA: Diagnosis not present

## 2020-09-07 DIAGNOSIS — Z79899 Other long term (current) drug therapy: Secondary | ICD-10-CM | POA: Diagnosis not present

## 2020-09-07 DIAGNOSIS — Z888 Allergy status to other drugs, medicaments and biological substances status: Secondary | ICD-10-CM | POA: Diagnosis not present

## 2020-09-09 DIAGNOSIS — G43911 Migraine, unspecified, intractable, with status migrainosus: Secondary | ICD-10-CM | POA: Diagnosis not present

## 2020-09-10 DIAGNOSIS — G43911 Migraine, unspecified, intractable, with status migrainosus: Secondary | ICD-10-CM | POA: Diagnosis not present

## 2020-09-11 DIAGNOSIS — G43911 Migraine, unspecified, intractable, with status migrainosus: Secondary | ICD-10-CM | POA: Diagnosis not present

## 2020-09-13 DIAGNOSIS — G43009 Migraine without aura, not intractable, without status migrainosus: Secondary | ICD-10-CM | POA: Diagnosis not present

## 2020-09-13 DIAGNOSIS — Z79899 Other long term (current) drug therapy: Secondary | ICD-10-CM | POA: Diagnosis not present

## 2020-09-13 DIAGNOSIS — Z793 Long term (current) use of hormonal contraceptives: Secondary | ICD-10-CM | POA: Diagnosis not present

## 2020-09-13 DIAGNOSIS — G43711 Chronic migraine without aura, intractable, with status migrainosus: Secondary | ICD-10-CM | POA: Diagnosis not present

## 2020-09-13 DIAGNOSIS — Z885 Allergy status to narcotic agent status: Secondary | ICD-10-CM | POA: Diagnosis not present

## 2020-09-13 DIAGNOSIS — Z91018 Allergy to other foods: Secondary | ICD-10-CM | POA: Diagnosis not present

## 2020-09-13 DIAGNOSIS — R519 Headache, unspecified: Secondary | ICD-10-CM | POA: Diagnosis not present

## 2020-09-13 DIAGNOSIS — Z9102 Food additives allergy status: Secondary | ICD-10-CM | POA: Diagnosis not present

## 2020-09-13 DIAGNOSIS — H53149 Visual discomfort, unspecified: Secondary | ICD-10-CM | POA: Diagnosis not present

## 2020-09-13 DIAGNOSIS — Z91048 Other nonmedicinal substance allergy status: Secondary | ICD-10-CM | POA: Diagnosis not present

## 2020-09-13 DIAGNOSIS — R110 Nausea: Secondary | ICD-10-CM | POA: Diagnosis not present

## 2020-09-13 DIAGNOSIS — G4489 Other headache syndrome: Secondary | ICD-10-CM | POA: Diagnosis not present

## 2020-09-13 DIAGNOSIS — Z888 Allergy status to other drugs, medicaments and biological substances status: Secondary | ICD-10-CM | POA: Diagnosis not present

## 2020-09-13 DIAGNOSIS — Z91013 Allergy to seafood: Secondary | ICD-10-CM | POA: Diagnosis not present

## 2020-10-22 IMAGING — US US MFM AMNIOCENTESIS
1 series · 14 of 28 positions shown · non-contrast
Comparison: none

[Series 1: us mfm amniocentesis · 42 acquisitions, 14 frames shown]
[im 2/42]
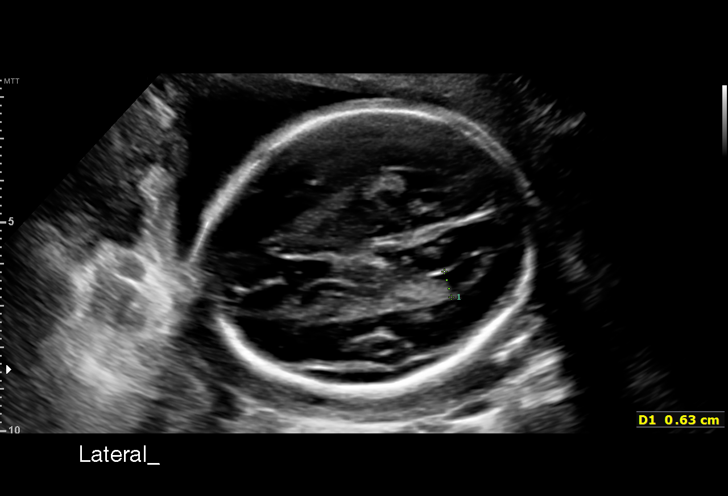
[im 5/42]
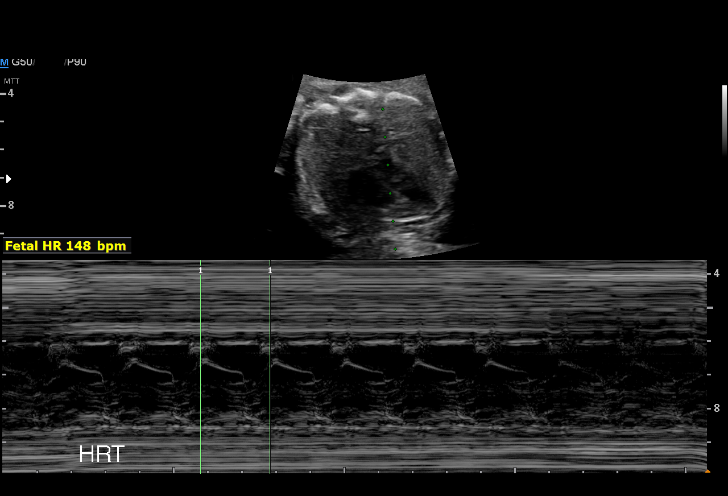
[im 8/42]
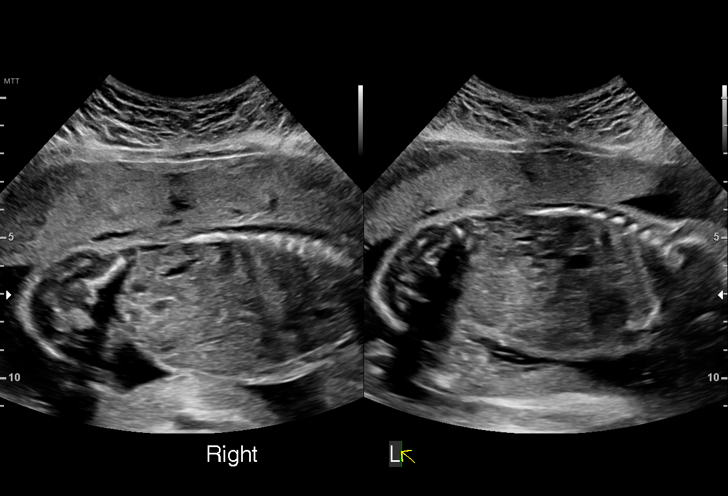
[im 11/42]
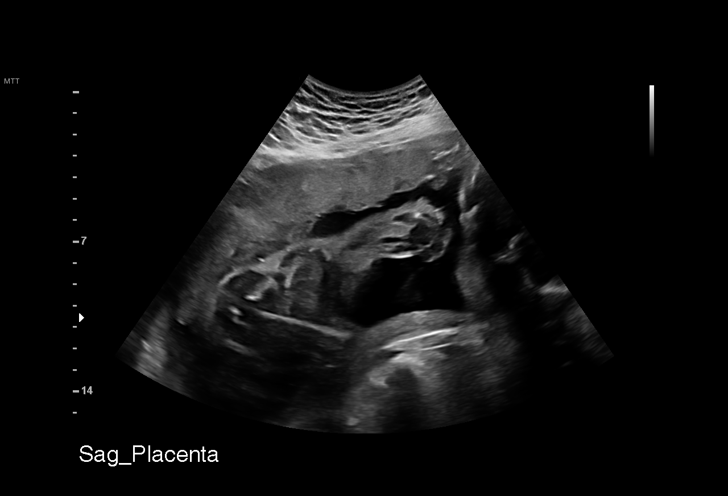
[im 14/42]
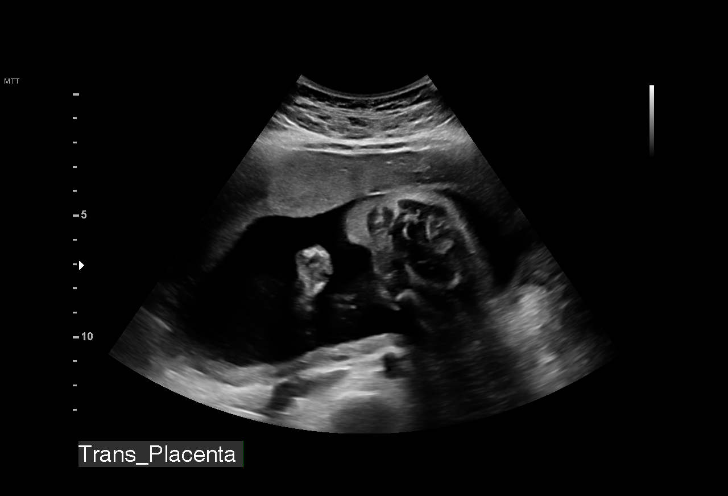
[im 17/42]
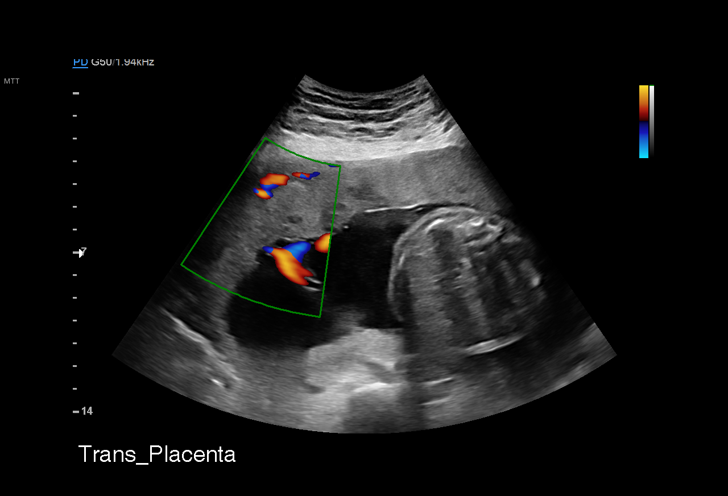
[im 20/42]
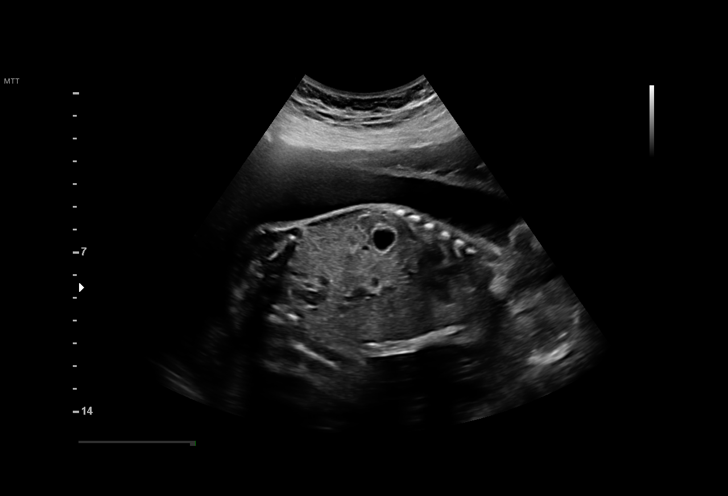
[im 23/42]
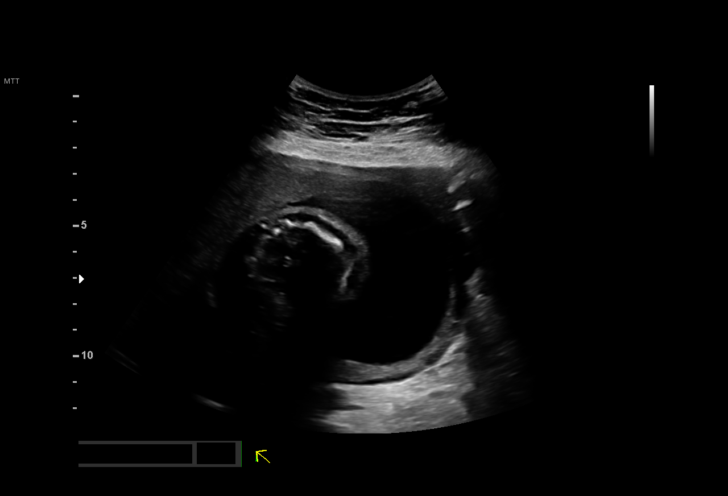
[im 26/42]
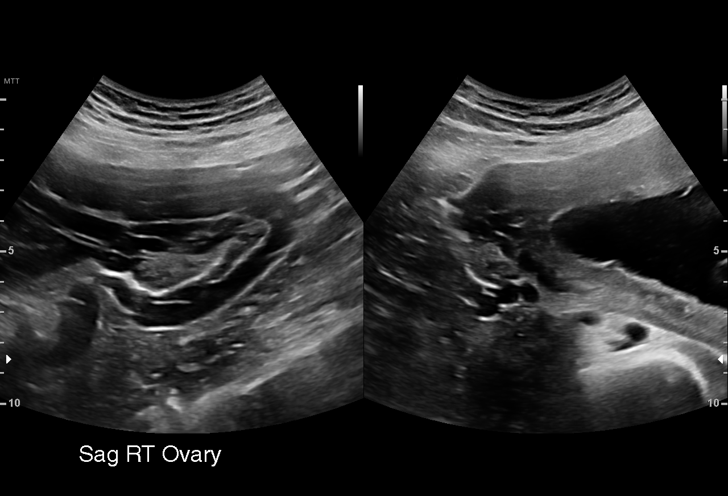
[im 29/42]
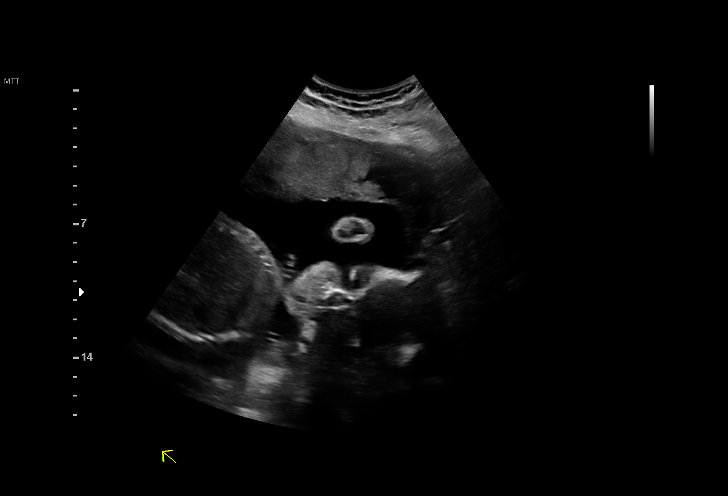
[im 32/42]
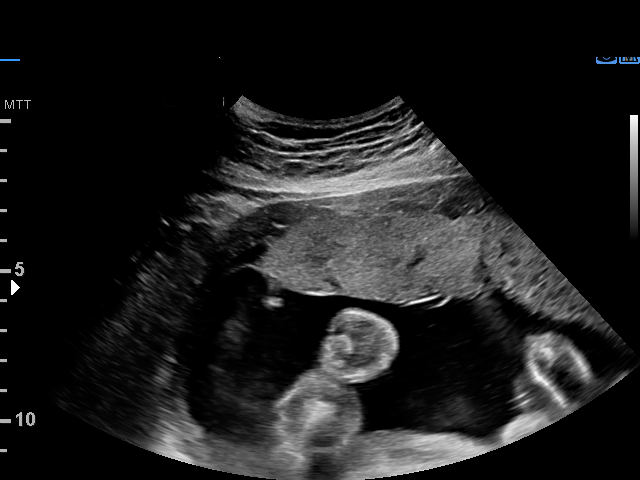
[im 35/42]
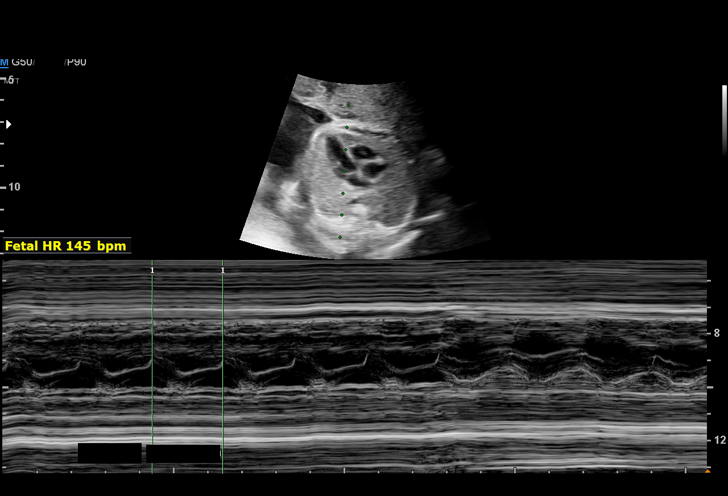
[im 38/42]
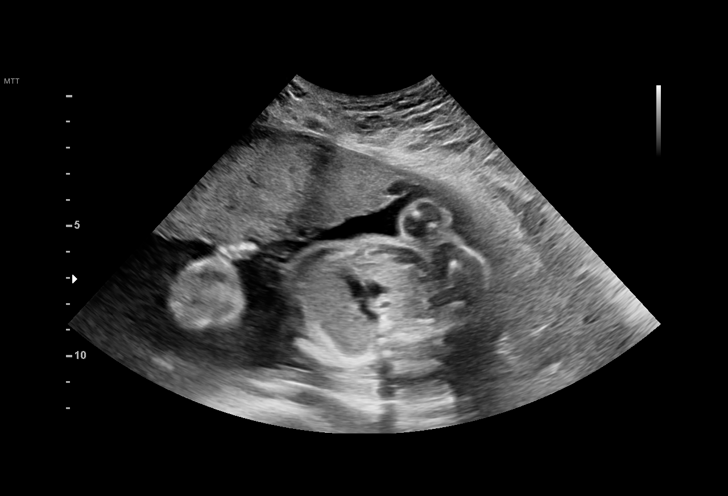
[im 42/42]
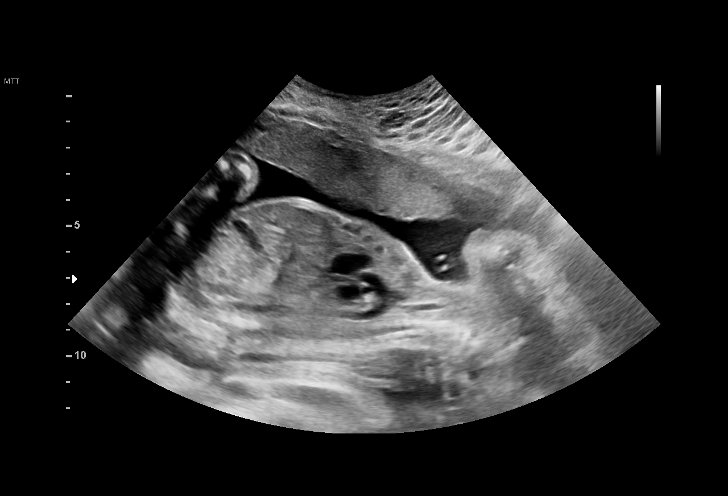

[14 of 28 positions shown; findings below may reference images not displayed]

----------------------------------------------------------------------

 ----------------------------------------------------------------------
Indications

  Echogenic intracardiac focus of the heart
  (EIF)
  24 weeks gestation of pregnancy
  Medical complication of pregnancy
  (tachycardia)
  Hyperemesis gravidarum
  Encounter for antenatal screening for
  malformations (AFP neg)
  Encounter for antenatal screening for other
  genetic defects
 ----------------------------------------------------------------------
Fetal Evaluation

 Num Of Fetuses:         1
 Fetal Heart Rate(bpm):  148
 Cardiac Activity:       Observed
 Presentation:           Cephalic
 Placenta:               Anterior
 P. Cord Insertion:      Previously Visualized

 Amniotic Fluid
 AFI FV:      Within normal limits

                             Largest Pocket(cm)

OB History

 Gravidity:    1         Term:   0        Prem:   0        SAB:   0
 TOP:          0       Ectopic:  0        Living: 0
Gestational Age
 LMP:           24w 2d        Date:  06/04/19                 EDD:   03/10/20
 Best:          24w 2d     Det. By:  LMP  (06/04/19)          EDD:   03/10/20
Anatomy

 Ventricles:            Appears normal         Kidneys:                Appear normal
 Diaphragm:             Appears normal         Bladder:                Appears normal
 Stomach:               Appears normal, left
                        sided
Guided Procedures

 Type:   Amniocentesis

 FH Post Procedure:     Normal             RH Type:          A+
 Rh Immune Globulin:    Not required,      Discharge Inst.:  Post-procedure
                        Rh positive                          instructions
                                                             given
 Needle Insertions:     22 gauge x 1       Vol. Withdrawn:   25 ml of clear
                                                             amniotic fluid
 Transabdominal:        Yes
Cervix Uterus Adnexa

 Cervix
 Not visualized (advanced GA >64wks)

 Uterus
 No abnormality visualized.

 Left Ovary
 Within normal limits. No adnexal mass visualized.

 Right Ovary
 Within normal limits. No adnexal mass visualized.

 Cul De Sac
 No free fluid seen.

 Adnexa
 No abnormality visualized.
Comments

 This patient was seen for an amniocentesis procedure today
 for definitive diagnosis of fetal aneuploidy due to an
 echogenic focus that was noted during her prior ultrasound
 exam.  The patient has received extensive counseling from
 both myself and the genetic counselor regarding the risks
 versus benefits of the amniocentesis procedure.  She has
 stated that she wants the procedure done today for definitive
 diagnosis.  She denies any problems since her last exam.
 After informed consent was obtained, a timeout was
 performed verifying the patient's identity and the indications
 for the procedure.  The patient was then prepped and draped
 in the usual sterile fashion.
 An uncomplicated genetic amniocentesis was performed
 today obtaining 25 cc of straw-colored amniotic fluid which
 was then sent off for amniotic fluid AFP, FISH, and
 chromosome analysis.  The patient was advised that our
 genetic counselor will notify her regarding the results of the
 amniocentesis.  Post amniocentesis instructions were
 discussed.  As the patient's blood type is Rh positive, a dose
 of RhoGam was not given following the procedure.
 No further exams were scheduled in our office.

## 2020-11-24 IMAGING — US US ABDOMEN LIMITED
1 series · 15 of 25 positions shown · non-contrast
Comparison: None.

CLINICAL DATA: Right upper quadrant pain. Hyperemesis. Third
trimester gestation

EXAM:
ULTRASOUND ABDOMEN LIMITED RIGHT UPPER QUADRANT

[Series 1: us abdomen limited · 15 of 81 slices shown]
[im 1/81]
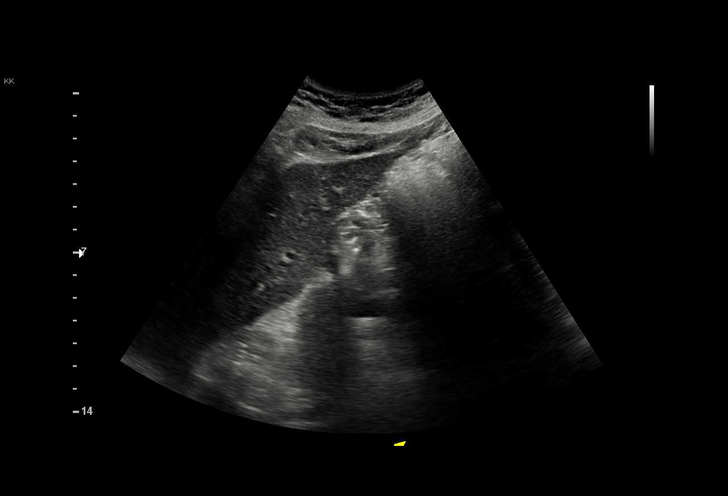
[im 7/81]
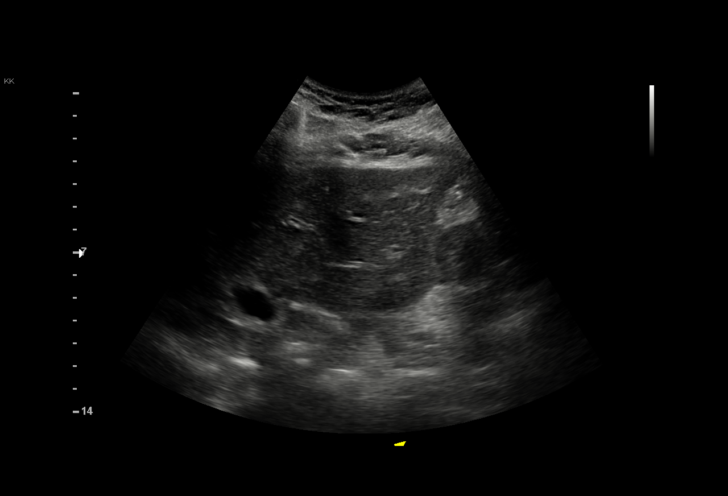
[im 14/81]
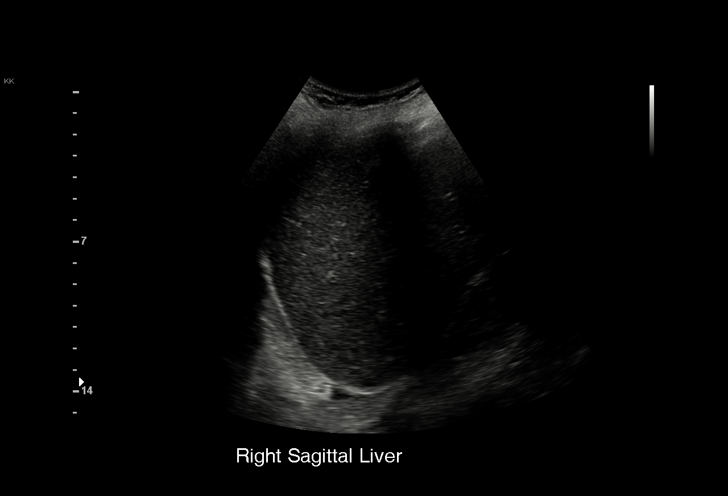
[im 17/81]
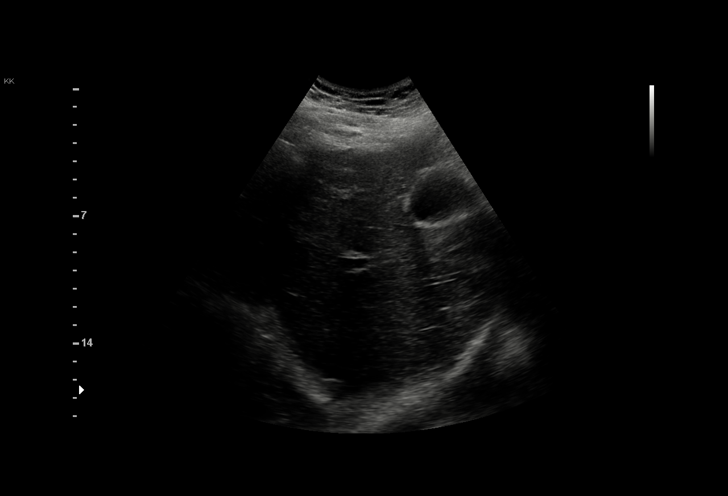
[im 24/81]
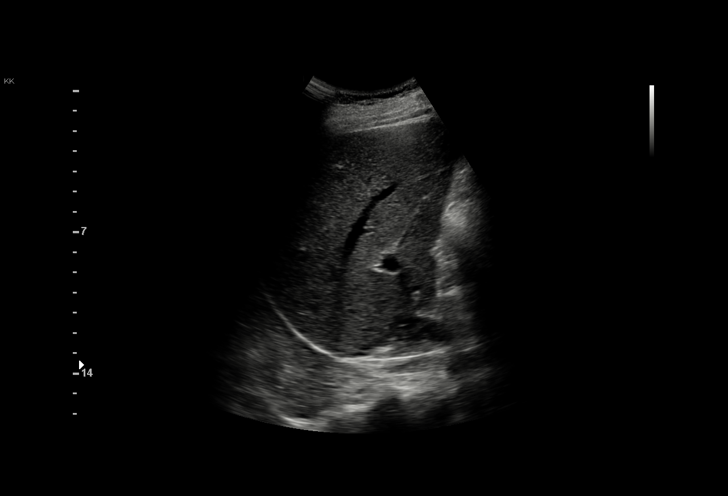
[im 31/81]
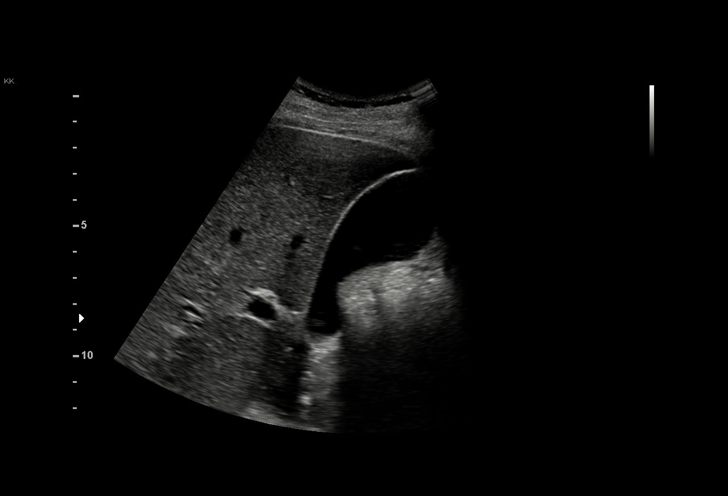
[im 34/81]
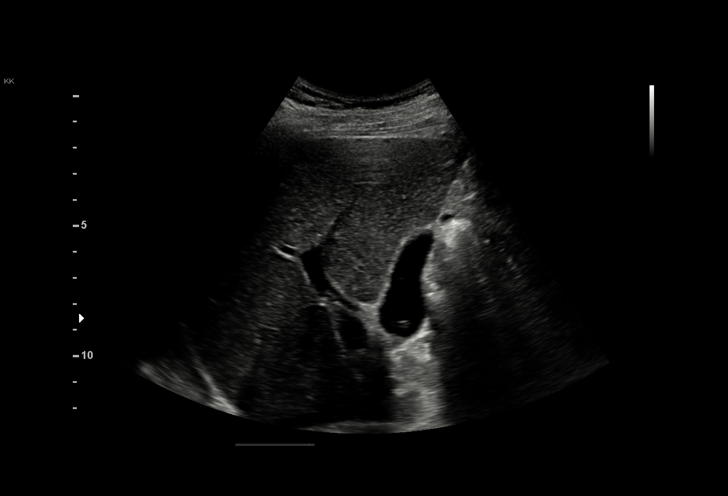
[im 41/81]
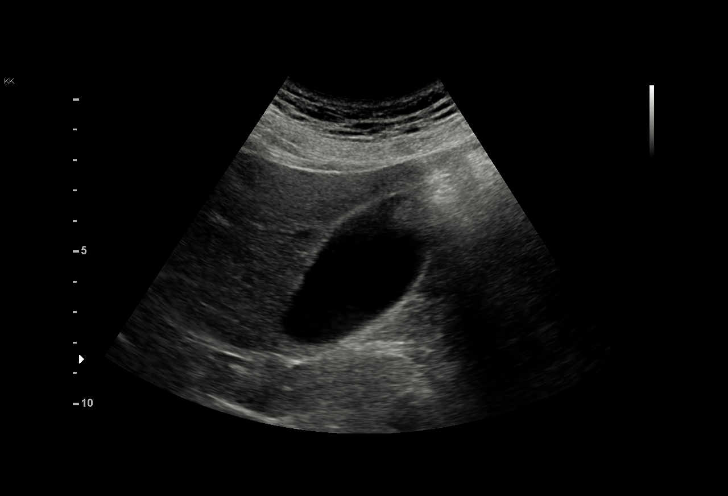
[im 47/81]
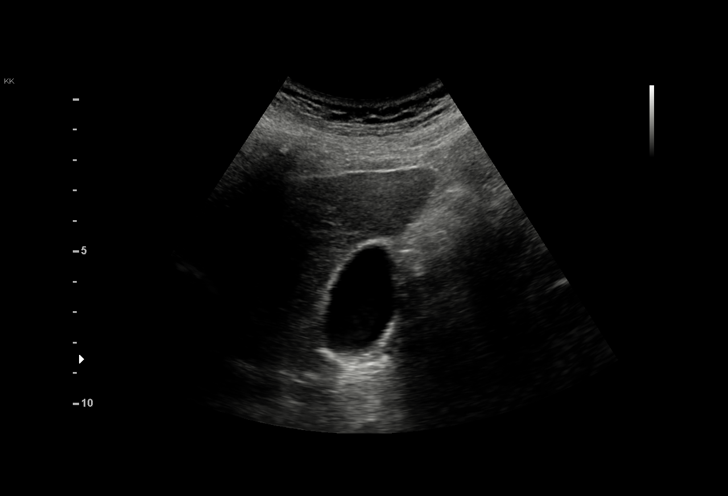
[im 51/81]
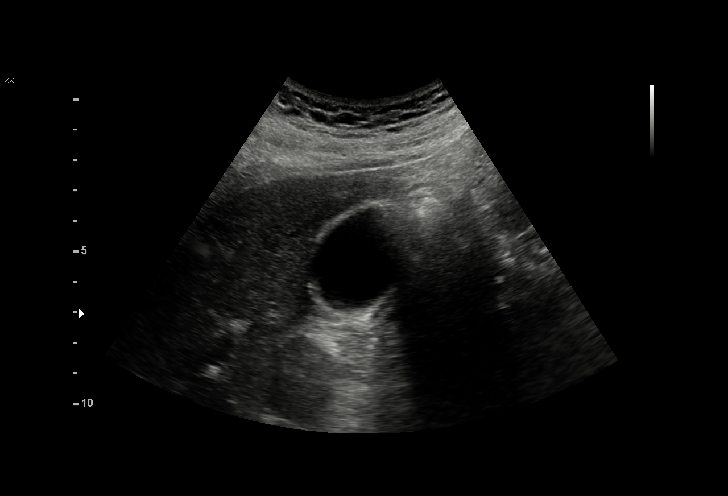
[im 57/81]
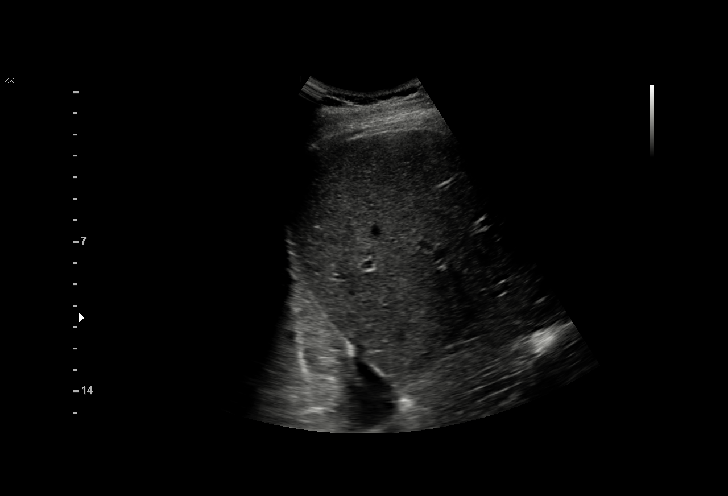
[im 64/81]
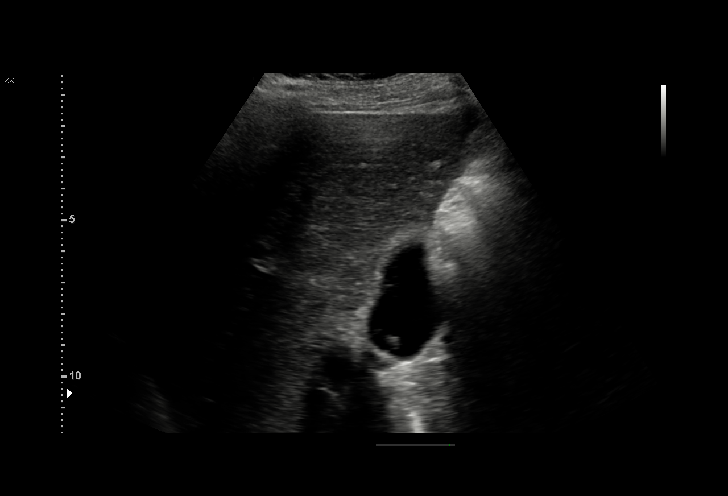
[im 67/81]
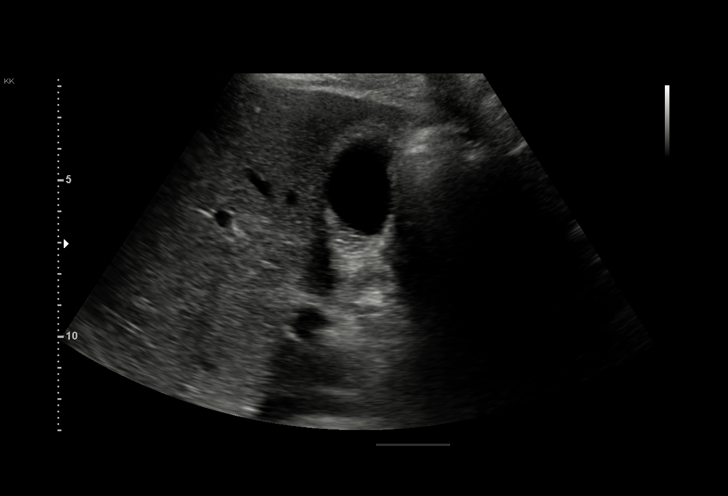
[im 74/81]
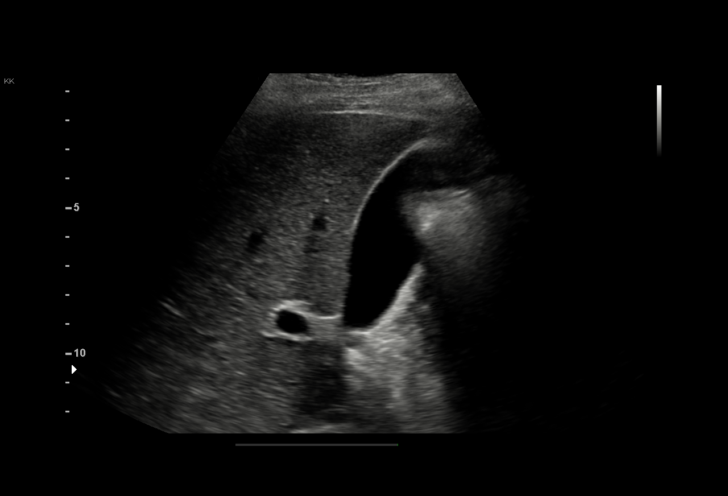
[im 81/81]
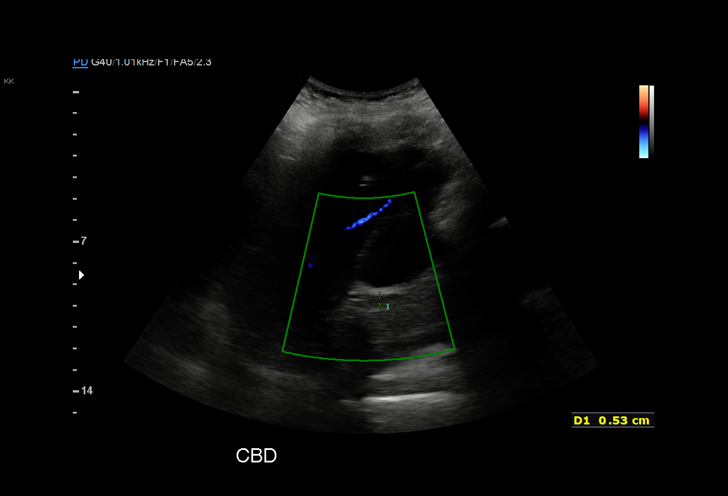

[15 of 25 positions shown; findings below may reference images not displayed]

FINDINGS: Gallbladder:

Within the gallbladder, there is a 5 mm echogenic focus which
neither moves nor shadows, a presumed polyp. There are no echogenic
foci which move and shadow as is expected with gallstones. No
gallbladder wall thickening or pericholecystic fluid. Patient is
tender over the right upper quadrant.

Common bile duct:

Diameter: 5 mm. No intrahepatic or extrahepatic biliary duct
dilatation.

Liver:

No focal lesion identified. Within normal limits in parenchymal
echogenicity. Portal vein is patent on color Doppler imaging with
normal direction of blood flow towards the liver.

Other: None.
IMPRESSION: 1. 5 mm apparent polyp within the gallbladder. Per consensus
guidelines, a polyp of this size does not warrant additional imaging
surveillance. There are no evident gallstones, gallbladder wall
thickening, or pericholecystic fluid. Patient is tender in the right
upper quadrant region. The significance of this finding in light of
other findings is uncertain. This finding warrants close clinical
surveillance.

2.  Study otherwise unremarkable.

## 2020-12-23 IMAGING — DX DG FOOT 2V*L*
2 series · 2 of 2 positions shown · non-contrast
Comparison: None.

CLINICAL DATA: Foot pain

EXAM:
LEFT FOOT - 2 VIEW

[foot ap]
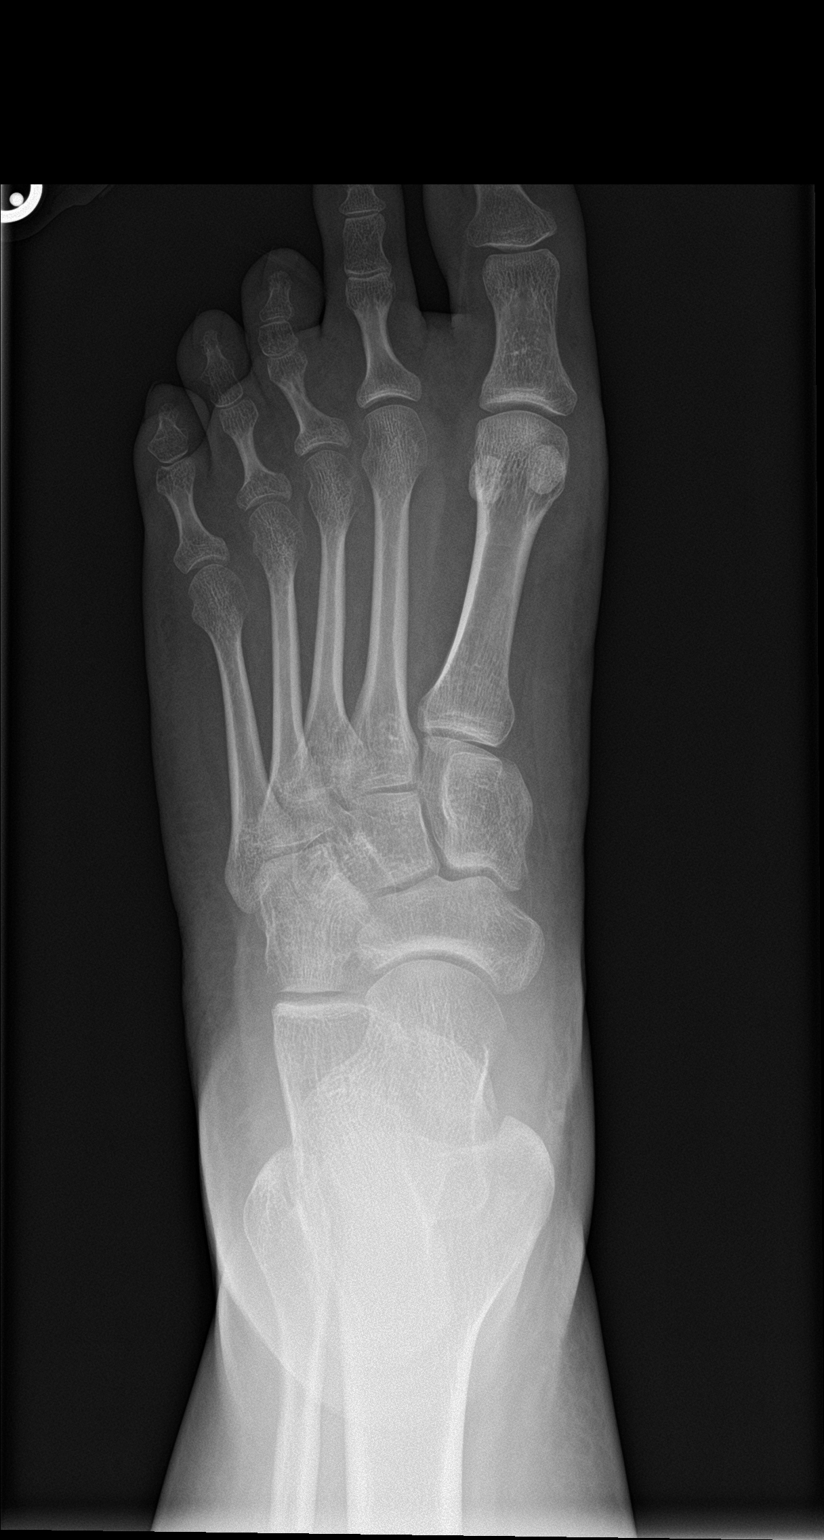

[foot lat]
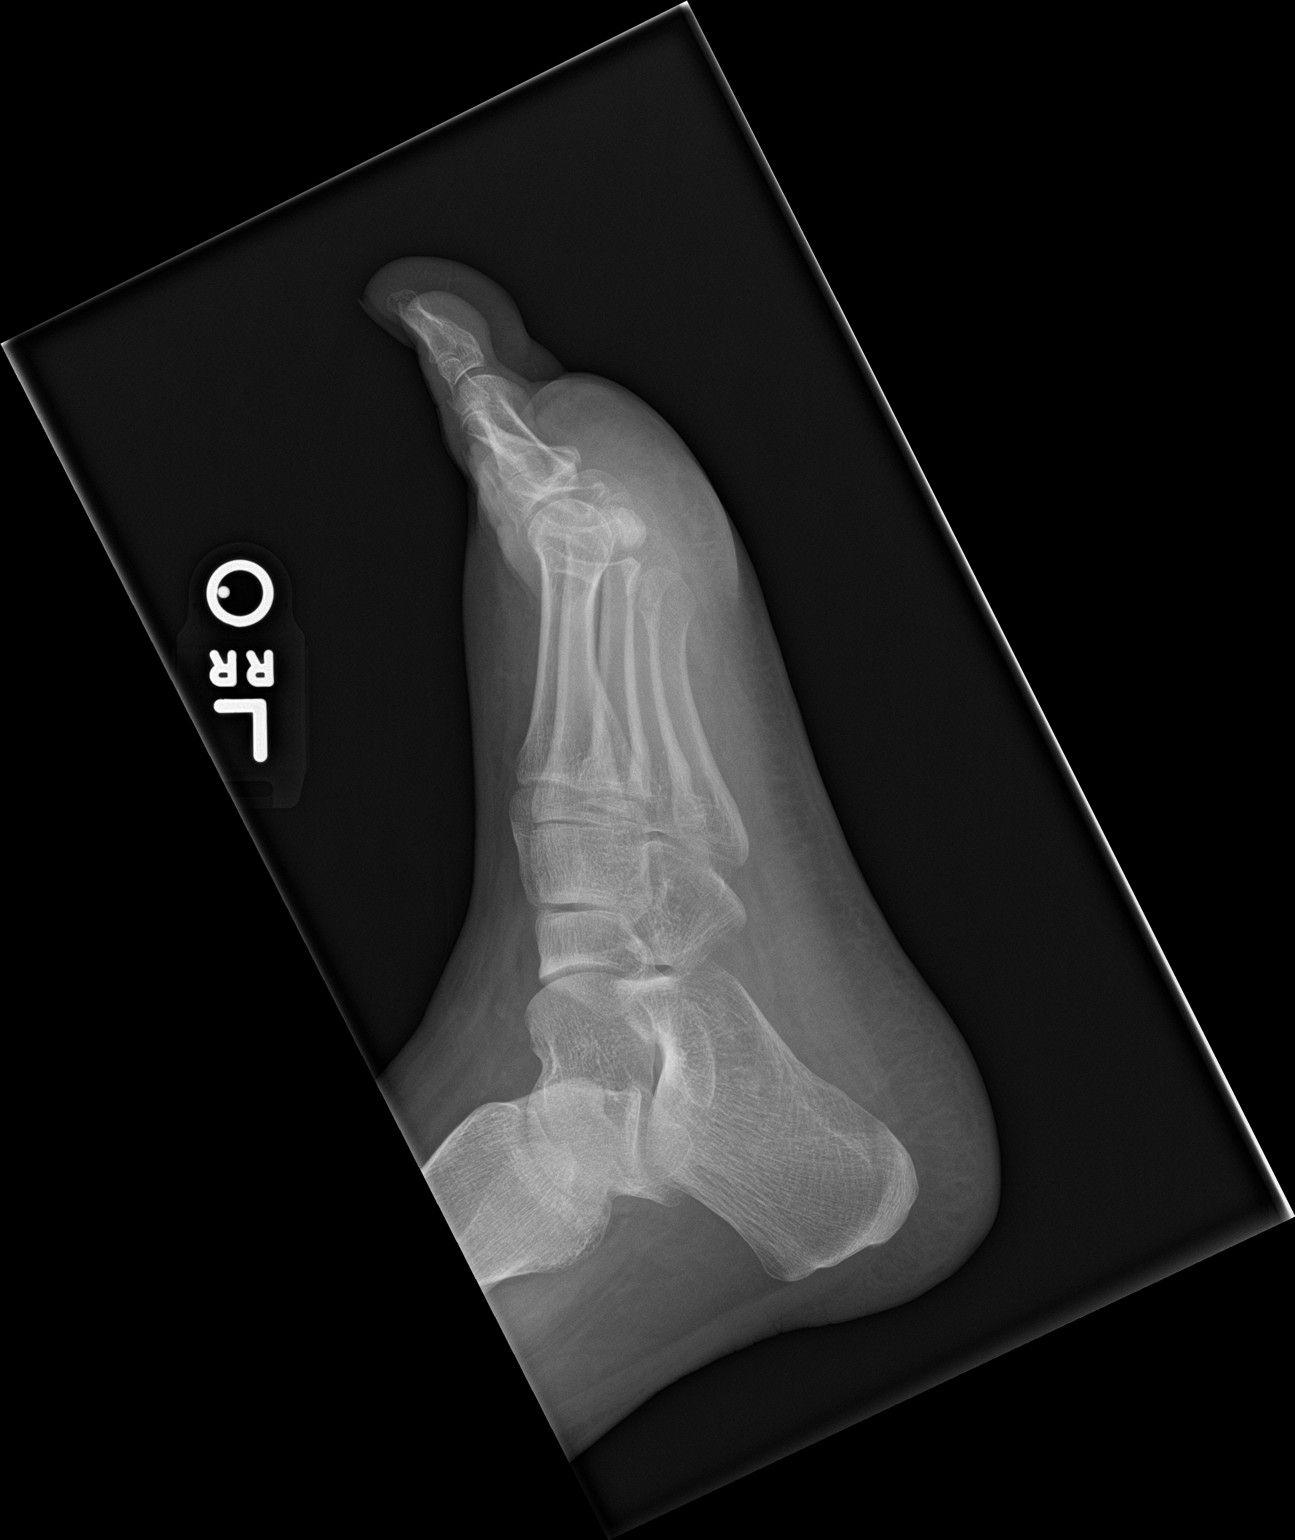

[2 of 2 positions shown; findings below may reference images not displayed]

FINDINGS: There is no evidence of fracture or dislocation. There is no
evidence of arthropathy or other focal bone abnormality. Soft
tissues are unremarkable.
IMPRESSION: Negative.

## 2021-02-19 ENCOUNTER — Other Ambulatory Visit: Payer: Self-pay | Admitting: Family Medicine

## 2023-02-19 ENCOUNTER — Encounter: Payer: Self-pay | Admitting: *Deleted
